# Patient Record
Sex: Male | Born: 1965
Health system: Southern US, Community
[De-identification: ages and names within clinical notes are randomized; demographics above are authoritative.]

## PROBLEM LIST (undated history)

## (undated) DIAGNOSIS — K429 Umbilical hernia without obstruction or gangrene: Secondary | ICD-10-CM

## (undated) DIAGNOSIS — K219 Gastro-esophageal reflux disease without esophagitis: Secondary | ICD-10-CM

## (undated) DIAGNOSIS — T8859XA Other complications of anesthesia, initial encounter: Secondary | ICD-10-CM

## (undated) DIAGNOSIS — K2270 Barrett's esophagus without dysplasia: Secondary | ICD-10-CM

## (undated) DIAGNOSIS — K6289 Other specified diseases of anus and rectum: Secondary | ICD-10-CM

## (undated) DIAGNOSIS — K5790 Diverticulosis of intestine, part unspecified, without perforation or abscess without bleeding: Secondary | ICD-10-CM

## (undated) DIAGNOSIS — IMO0001 Reserved for inherently not codable concepts without codable children: Secondary | ICD-10-CM

## (undated) DIAGNOSIS — K449 Diaphragmatic hernia without obstruction or gangrene: Secondary | ICD-10-CM

## (undated) DIAGNOSIS — Z531 Procedure and treatment not carried out because of patient's decision for reasons of belief and group pressure: Secondary | ICD-10-CM

## (undated) DIAGNOSIS — R0981 Nasal congestion: Secondary | ICD-10-CM

## (undated) DIAGNOSIS — T4145XA Adverse effect of unspecified anesthetic, initial encounter: Secondary | ICD-10-CM

## (undated) DIAGNOSIS — R51 Headache: Secondary | ICD-10-CM

## (undated) HISTORY — DX: Barrett's esophagus without dysplasia: K22.70

## (undated) HISTORY — DX: Diaphragmatic hernia without obstruction or gangrene: K44.9

## (undated) HISTORY — DX: Diverticulosis of intestine, part unspecified, without perforation or abscess without bleeding: K57.90

## (undated) HISTORY — PX: CARPAL TUNNEL RELEASE: SHX101

## (undated) HISTORY — DX: Gastro-esophageal reflux disease without esophagitis: K21.9

## (undated) HISTORY — PX: WRIST SURGERY: SHX841

## (undated) HISTORY — DX: Other specified diseases of anus and rectum: K62.89

## (undated) HISTORY — DX: Umbilical hernia without obstruction or gangrene: K42.9

## (undated) HISTORY — DX: Nasal congestion: R09.81

---

## 2007-08-19 HISTORY — PX: ORIF DISTAL RADIUS FRACTURE: SUR927

## 2008-03-04 ENCOUNTER — Emergency Department (HOSPITAL_COMMUNITY): Admission: EM | Admit: 2008-03-04 | Discharge: 2008-03-04 | Payer: Self-pay | Admitting: Emergency Medicine

## 2008-03-17 ENCOUNTER — Ambulatory Visit: Admission: RE | Admit: 2008-03-17 | Discharge: 2008-03-17 | Payer: Self-pay | Admitting: Neurosurgery

## 2008-03-21 LAB — HM COLONOSCOPY

## 2008-03-22 ENCOUNTER — Encounter: Admission: RE | Admit: 2008-03-22 | Discharge: 2008-03-22 | Payer: Self-pay | Admitting: Orthopedic Surgery

## 2010-01-31 DIAGNOSIS — K2270 Barrett's esophagus without dysplasia: Secondary | ICD-10-CM

## 2010-01-31 HISTORY — DX: Barrett's esophagus without dysplasia: K22.70

## 2010-04-01 ENCOUNTER — Encounter (INDEPENDENT_AMBULATORY_CARE_PROVIDER_SITE_OTHER): Payer: Self-pay | Admitting: Surgery

## 2010-04-01 ENCOUNTER — Inpatient Hospital Stay (HOSPITAL_COMMUNITY): Admission: RE | Admit: 2010-04-01 | Discharge: 2010-04-06 | Payer: Self-pay | Admitting: Surgery

## 2010-04-01 HISTORY — PX: OTHER SURGICAL HISTORY: SHX169

## 2010-11-01 LAB — CBC
HCT: 41.6 % (ref 39.0–52.0)
HCT: 44.1 % (ref 39.0–52.0)
Hemoglobin: 14.2 g/dL (ref 13.0–17.0)
Hemoglobin: 15.3 g/dL (ref 13.0–17.0)
MCH: 30.3 pg (ref 26.0–34.0)
MCHC: 34.1 g/dL (ref 30.0–36.0)
MCHC: 34.6 g/dL (ref 30.0–36.0)
MCV: 88.9 fL (ref 78.0–100.0)
Platelets: 248 10*3/uL (ref 150–400)
RBC: 4.68 MIL/uL (ref 4.22–5.81)
RDW: 12.8 % (ref 11.5–15.5)
WBC: 15.8 10*3/uL — ABNORMAL HIGH (ref 4.0–10.5)

## 2010-11-01 LAB — GLUCOSE, CAPILLARY
Glucose-Capillary: 107 mg/dL — ABNORMAL HIGH (ref 70–99)
Glucose-Capillary: 114 mg/dL — ABNORMAL HIGH (ref 70–99)
Glucose-Capillary: 119 mg/dL — ABNORMAL HIGH (ref 70–99)
Glucose-Capillary: 126 mg/dL — ABNORMAL HIGH (ref 70–99)
Glucose-Capillary: 134 mg/dL — ABNORMAL HIGH (ref 70–99)
Glucose-Capillary: 138 mg/dL — ABNORMAL HIGH (ref 70–99)
Glucose-Capillary: 143 mg/dL — ABNORMAL HIGH (ref 70–99)
Glucose-Capillary: 144 mg/dL — ABNORMAL HIGH (ref 70–99)
Glucose-Capillary: 146 mg/dL — ABNORMAL HIGH (ref 70–99)
Glucose-Capillary: 146 mg/dL — ABNORMAL HIGH (ref 70–99)
Glucose-Capillary: 148 mg/dL — ABNORMAL HIGH (ref 70–99)
Glucose-Capillary: 149 mg/dL — ABNORMAL HIGH (ref 70–99)
Glucose-Capillary: 153 mg/dL — ABNORMAL HIGH (ref 70–99)
Glucose-Capillary: 154 mg/dL — ABNORMAL HIGH (ref 70–99)
Glucose-Capillary: 156 mg/dL — ABNORMAL HIGH (ref 70–99)
Glucose-Capillary: 157 mg/dL — ABNORMAL HIGH (ref 70–99)
Glucose-Capillary: 157 mg/dL — ABNORMAL HIGH (ref 70–99)
Glucose-Capillary: 159 mg/dL — ABNORMAL HIGH (ref 70–99)
Glucose-Capillary: 168 mg/dL — ABNORMAL HIGH (ref 70–99)
Glucose-Capillary: 181 mg/dL — ABNORMAL HIGH (ref 70–99)
Glucose-Capillary: 189 mg/dL — ABNORMAL HIGH (ref 70–99)
Glucose-Capillary: 198 mg/dL — ABNORMAL HIGH (ref 70–99)
Glucose-Capillary: 219 mg/dL — ABNORMAL HIGH (ref 70–99)

## 2010-11-01 LAB — COMPREHENSIVE METABOLIC PANEL
ALT: 26 U/L (ref 0–53)
Alkaline Phosphatase: 49 U/L (ref 39–117)
CO2: 29 mEq/L (ref 19–32)
Calcium: 9.7 mg/dL (ref 8.4–10.5)
GFR calc non Af Amer: >60 mL/min (ref >60)
Glucose, Bld: 121 mg/dL — ABNORMAL HIGH (ref 70–99)
Sodium: 138 mEq/L (ref 135–145)

## 2010-11-01 LAB — URINE MICROSCOPIC-ADD ON

## 2010-11-01 LAB — DIFFERENTIAL
Basophils Absolute: 0 10*3/uL (ref 0.0–0.1)
Basophils Relative: 0 % (ref 0–1)
Eosinophils Absolute: 0.1 10*3/uL (ref 0.0–0.7)
Lymphs Abs: 1.4 10*3/uL (ref 0.7–4.0)
Neutrophils Relative %: 64 % (ref 43–77)

## 2010-11-01 LAB — URINALYSIS, ROUTINE W REFLEX MICROSCOPIC
Bilirubin Urine: NEGATIVE
Glucose, UA: NEGATIVE mg/dL
Ketones, ur: NEGATIVE mg/dL
pH: 5.5 (ref 5.0–8.0)

## 2010-11-01 LAB — PROTIME-INR
INR: 1.08 (ref 0.00–1.49)
Prothrombin Time: 14.2 seconds (ref 11.6–15.2)

## 2010-11-01 LAB — BASIC METABOLIC PANEL
BUN: 5 mg/dL — ABNORMAL LOW (ref 6–23)
CO2: 28 mEq/L (ref 19–32)
Calcium: 8.2 mg/dL — ABNORMAL LOW (ref 8.4–10.5)
Chloride: 101 mEq/L (ref 96–112)
Creatinine, Ser: 0.84 mg/dL (ref 0.4–1.5)
GFR calc Af Amer: >60 mL/min (ref >60)
GFR calc non Af Amer: >60 mL/min (ref >60)
Glucose, Bld: 189 mg/dL — ABNORMAL HIGH (ref 70–99)
Potassium: 4.3 mEq/L (ref 3.5–5.1)
Sodium: 135 mEq/L (ref 135–145)

## 2010-12-31 NOTE — Op Note (Signed)
NAMECALIL, AMOR                  ACCOUNT NO.:  [id number]   MEDICAL RECORD NO.:  [id number]          PATIENT TYPE:   LOCATION:                                 FACILITY:   PHYSICIAN:  Artist Pais. Mina Marble, M.D.   DATE OF BIRTH:   DATE OF PROCEDURE:  03/17/2008  DATE OF DISCHARGE:                               OPERATIVE REPORT   PREOPERATIVE DIAGNOSIS:  Displaced intra-articular fracture, distal  radius on left.   POSTOPERATIVE DIAGNOSIS:  Displaced intra-articular fracture, distal  radius on left.   PROCEDURE:  Open reduction and fixation of above.   SURGEON:  Artist Pais. Mina Marble, MD   ASSISTANT:  None.   ANESTHESIA:  General.   TOURNIQUET TIME:  51 minutes.   COMPLICATIONS:  None.   DRAINS:  None.   OPERATION REPORT:  The patient was taken to the operating suite.  After  induction of adequate general anesthesia, left upper extremity was  prepped and draped in sterile fashion.  An Esmarch was used to  exsanguinate the limb and tourniquet was inflated to 250 mmHg.  At this  point in time, a longitudinal incision was made over the palmar aspect  of the left hand and wrist over the flexor carpi radialis tendon.  Skin  was incised.  The sheath overlying the FCR was incised.  The FCR was  retracted to the midline, the radial artery at lateral side.  This  interval was developed down to the level of the pronator quadratus.  The  pronator quadratus was subperiosteally stripped off the distal radius,  thus exposing the fracture site.  The fracture was carefully reduced  with manual traction, flexion, and ulnar deviation.  Once this was done,  the DVR plate was passed into the lower aspect of the distal radius with  a slotted hole and under fluoroscopic guidance, it was deemed to be in  adequate position.  Once this was done, the remaining 3 cortical screws  were placed proximally followed by smooth pegs distally x7.  Intraoperative fluoroscopy then revealed near anatomic  reduction on both  the AP lateral and oblique view.  The wound was then thoroughly  irrigated.  The incision was closed with 0 Vicryl to approximate the  pronator quadratus and a 3-0 Prolene subcuticular stitch on the skin.  Steri-Strips, 4x4s, fluffs, and a volar splint was applied.  The patient  tolerated the procedure well and went to recovery room in stable  fashion.     Artist Pais Mina Marble, M.D.  Electronically Signed    MAW/MEDQ  D:  03/17/2008  T:  03/18/2008  Job:  [id number]

## 2010-12-31 NOTE — Consult Note (Signed)
NAMEAKASHDEEP, CHUBA                  ACCOUNT NO.:  [id number]   MEDICAL RECORD NO.:  [id number]          PATIENT TYPE:  EMS   LOCATION:  ED                           FACILITY:  [facility]   PHYSICIAN:  Artist Pais. Mina Marble, M.D.DATE OF BIRTH:  10/02/1965   DATE OF CONSULTATION:  03/04/2008  DATE OF DISCHARGE:  03/04/2008                                 CONSULTATION   REQUESTING PHYSICIAN:  Bethann Berkshire, MD   REASON FOR CONSULTATION:  This is a 45 year old male, who is right-hand  dominant, and [place]  He  presents today with a displaced fracture of distal radius nondominant  left side.  He is 32, has no known drug allergies, no current  medications, no recent hospitalizations, or surgery.   FAMILY MEDICAL HISTORY:  Noncontributory.   SOCIAL HISTORY:  Noncontributory.   PHYSICAL EXAMINATION:  This is 45 year old male, alert and oriented x3.  Examination of the left upper extremity reveals an obvious deformity of  the hand and wrist with what appears to be a dorsally angulated distal  radius fracture.  The skin is intact.  Median, radial, and ulnar nerve  function is intact.  There are no open wounds.  The rest of his right  and left upper extremity exams are all by comparison.   X-rays show a distal radius fracture with apex lower angulation 30  degrees displaced with minimal articular extension.  The patient was  given a 2% lidocaine hematoma block, was placed in fingertrap traction,  and closed reduction was performed.  He was placed in a well-padded  sugar tong splint.  Postreduction films show restoration of a near  anatomic alignment.   IMPRESSION:  We have a 45 year old male, status post closed reduction of  distal radius fracture nondominant left wrist.   The patient was given discharge instructions for splint cast care and  instructions on the signs and symptoms of compartment syndrome, Percocet  for pain, and to follow up in my  office on March 07, 2008.  It was  discussed in great detail with Mr. Earhart the fact that this may  redisplace since both cortices were involved and it may require plate  fixation, however, at this point in time he has a near anatomic  alignment and we should be able to treat this conservatively at least  initially.  We will see him back in my office again on March 07, 2008,  for repeat films and repeat evaluation.      Artist Pais Mina Marble, M.D.  Electronically Signed     MAW/MEDQ  D:  03/04/2008  T:  03/05/2008  Job:  [id number]

## 2011-02-20 ENCOUNTER — Telehealth: Payer: Self-pay | Admitting: Internal Medicine

## 2011-02-20 NOTE — Telephone Encounter (Signed)
Forwarded to Dr. Brodie for review. °

## 2011-03-13 ENCOUNTER — Encounter: Payer: Self-pay | Admitting: Internal Medicine

## 2011-05-07 ENCOUNTER — Encounter: Payer: Self-pay | Admitting: Internal Medicine

## 2011-05-07 ENCOUNTER — Ambulatory Visit (INDEPENDENT_AMBULATORY_CARE_PROVIDER_SITE_OTHER): Payer: Federal, State, Local not specified - PPO | Admitting: Internal Medicine

## 2011-05-07 VITALS — BP 136/74 | HR 80 | Ht 66.0 in | Wt 196.0 lb

## 2011-05-07 DIAGNOSIS — K2270 Barrett's esophagus without dysplasia: Secondary | ICD-10-CM

## 2011-05-07 DIAGNOSIS — K5732 Diverticulitis of large intestine without perforation or abscess without bleeding: Secondary | ICD-10-CM

## 2011-05-07 MED ORDER — OMEPRAZOLE 40 MG PO CPDR: 40.0000 mg | DELAYED_RELEASE_CAPSULE | Freq: Every day | ORAL | Status: DC

## 2011-05-07 NOTE — Patient Instructions (Signed)
We have sent the following medications to your pharmacy: Omeprazole 40 mg. Take 1 tablet by mouth once daily. You will be due for a recall endoscopy in 01/2012. We will send you a reminder in the mail when it gets closer to that time. CC: Dr Juluis Rainier

## 2011-05-07 NOTE — Progress Notes (Signed)
Nathan Park 02-Jan-1966 MRN [id number]   History of Present Illness:  This is a 45 year old white male with a new diagnosis of long segment Barrett's esophagus.  His last upper endoscopy in was June 2011 in [place]. Patient is switching to a physician closer to his home since he now lives in [place]. He denies any symptoms of reflux until he ran out of omeprazole 40 mg a day. He denies dysphagia. He sleeps with the head of the bed elevated and tries to eat at least 3 hours prior to going to bed. He has a sedentary job and denies lifting heavy objects. 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 He is status post sigmoid resection by Dr.Gerkin in August 2011. He is doing well from that standpoint. A colonoscopy prior to the colon resection did not show any polyps.   Past Medical History  Diagnosis Date  . Diverticulosis   . Umbilical hernia   . GERD (gastroesophageal reflux disease)   . Undiagnosed cardiac murmurs   . Nephrolithiasis   . Sleep apnea   . Barrett esophagus 01/31/10    at 32 cm  . Hiatal hernia   . Diabetes mellitus     diet control   . Hemorrhoids   . Rectal pain    Past Surgical History  Procedure Date  . Sigmoid colectomy 04/01/10  . Take down of colovesical fistula 04/01/10  . Takedown of enterovesical fistula 04/01/10  . Repair umblical hernia repair 04/01/10  . Orif distal radius fracture 2009  . Carpal tunnel release   . Mouth surgery     tooth extraction  . Wrist surgery     left    reports that he has never smoked. He has never used smokeless tobacco. He reports that he drinks alcohol. He reports that he does not use illicit drugs. family history includes Diabetes in his father.  There is no history of Colon cancer. Allergies  Allergen Reactions  . Oxycontin     Itching and nausea         Review of Systems:  The remainder of the 10  point ROS is negative except as outlined in H&P   Physical Exam: General appearance  Well  developed, in no distress. Eyes- non icteric. HEENT nontraumatic, normocephalic. Mouth no lesions, tongue papillated, no cheilosis. Neck supple without adenopathy, thyroid not enlarged, no carotid bruits, no JVD. Lungs Clear to auscultation bilaterally. Cor normal S1 normal S2, regular rhythm , no murmur,  quiet precordium. Abdomen minimal tenderness in left lower quadrant. No distention. Normal active bowel sounds. Rectal: Not done. Extremities no pedal edema. Skin no lesions. Neurological alert and oriented x 3. Psychological normal mood and affect.  Assessment and Plan:  Problem #1 Long segment Barrett's esophagus. Patient will be due for  recall upper endoscopy in June 2013. He is to continue omeprazole 40 mg a day which we will send to his mail order pharmacy. Samples of Nexium 40 mg a day have given to him to take over the next 2 weeks until his mail order arrives.  Problem #2 History of diverticulitis of the sigmoid resection for colovesical fistula. Patient is currently asymptomatic. He is to continue a high fiber diet. A recall colonoscopy will be due in10 years.   05/07/2011 Lina Sar

## 2011-05-16 LAB — CBC
HCT: 44.3
Hemoglobin: 15.1
MCHC: 34
MCV: 88.9
Platelets: 271
RBC: 4.99
RDW: 12.8
WBC: 12.3 — ABNORMAL HIGH

## 2011-05-22 ENCOUNTER — Ambulatory Visit (INDEPENDENT_AMBULATORY_CARE_PROVIDER_SITE_OTHER): Payer: Federal, State, Local not specified - PPO | Admitting: Internal Medicine

## 2011-05-22 ENCOUNTER — Other Ambulatory Visit (INDEPENDENT_AMBULATORY_CARE_PROVIDER_SITE_OTHER): Payer: Federal, State, Local not specified - PPO

## 2011-05-22 VITALS — BP 116/70 | HR 70 | Temp 98.3°F | Resp 16 | Wt 195.0 lb

## 2011-05-22 DIAGNOSIS — K648 Other hemorrhoids: Secondary | ICD-10-CM

## 2011-05-22 DIAGNOSIS — IMO0001 Reserved for inherently not codable concepts without codable children: Secondary | ICD-10-CM

## 2011-05-22 DIAGNOSIS — Z23 Encounter for immunization: Secondary | ICD-10-CM

## 2011-05-22 DIAGNOSIS — E118 Type 2 diabetes mellitus with unspecified complications: Secondary | ICD-10-CM | POA: Insufficient documentation

## 2011-05-22 LAB — MICROALBUMIN / CREATININE URINE RATIO: Microalb, Ur: 1.1 mg/dL (ref 0.0–1.9)

## 2011-05-22 LAB — COMPREHENSIVE METABOLIC PANEL
ALT: 33 U/L (ref 0–53)
Alkaline Phosphatase: 49 U/L (ref 39–117)
Creatinine, Ser: 1 mg/dL (ref 0.4–1.5)
Glucose, Bld: 129 mg/dL — ABNORMAL HIGH (ref 70–99)
Sodium: 140 mEq/L (ref 135–145)
Total Bilirubin: 0.5 mg/dL (ref 0.3–1.2)
Total Protein: 7.7 g/dL (ref 6.0–8.3)

## 2011-05-22 LAB — URINALYSIS, ROUTINE W REFLEX MICROSCOPIC
Ketones, ur: NEGATIVE
Leukocytes, UA: NEGATIVE
Specific Gravity, Urine: 1.025 (ref 1.000–1.030)
Urobilinogen, UA: 0.2 (ref 0.0–1.0)
pH: 6 (ref 5.0–8.0)

## 2011-05-22 LAB — CBC WITH DIFFERENTIAL/PLATELET
Basophils Relative: 0.5 % (ref 0.0–3.0)
Eosinophils Relative: 4.1 % (ref 0.0–5.0)
HCT: 46.2 % (ref 39.0–52.0)
Hemoglobin: 15.7 g/dL (ref 13.0–17.0)
Lymphs Abs: 2.1 10*3/uL (ref 0.7–4.0)
Monocytes Relative: 8.1 % (ref 3.0–12.0)
Neutro Abs: 4.7 10*3/uL (ref 1.4–7.7)
RBC: 5.25 Mil/uL (ref 4.22–5.81)
WBC: 7.7 10*3/uL (ref 4.5–10.5)

## 2011-05-22 LAB — TSH: TSH: 0.75 u[IU]/mL (ref 0.35–5.50)

## 2011-05-22 LAB — LIPID PANEL: Total CHOL/HDL Ratio: 4

## 2011-05-22 MED ORDER — SAXAGLIPTIN HCL 5 MG PO TABS: 5.0000 mg | ORAL_TABLET | Freq: Every day | ORAL | Status: DC

## 2011-05-22 MED ORDER — HYDROCORTISONE ACE-PRAMOXINE 1-1 % RE FOAM: 1.0000 | Freq: Two times a day (BID) | RECTAL | Status: AC

## 2011-05-22 NOTE — Progress Notes (Signed)
Subjective:    Patient ID: Nathan Park, male    DOB: [birth year], 45 y.o.   MRN: [id number]  Diabetes He presents for his follow-up diabetic visit. He has type 2 diabetes mellitus. The initial diagnosis of diabetes was made 2 years (2 years) ago. His disease course has been fluctuating. There are no hypoglycemic associated symptoms. Pertinent negatives for hypoglycemia include no pallor. Pertinent negatives for diabetes include no blurred vision, no chest pain, no fatigue, no foot paresthesias, no foot ulcerations, no polydipsia, no polyphagia, no polyuria, no visual change, no weakness and no weight loss. There are no hypoglycemic complications. Symptoms are stable. There are no diabetic complications. Current diabetic treatment includes diet. He is compliant with treatment most of the time. His weight is increasing steadily. He is following a generally healthy diet. Meal planning includes avoidance of concentrated sweets. He has not had a previous visit with a dietician. He participates in exercise intermittently. There is no change in his home blood glucose trend. His breakfast blood glucose range is generally 110-130 mg/dl. His lunch blood glucose range is generally 130-140 mg/dl. His dinner blood glucose range is generally 140-180 mg/dl. His highest blood glucose is >200 mg/dl. His overall blood glucose range is 130-140 mg/dl. An ACE inhibitor/angiotensin II receptor blocker is not being taken. He does not see a podiatrist.Eye exam is not current.      Review of Systems  Constitutional: Negative.  Negative for weight loss and fatigue.  HENT: Negative.   Eyes: Negative.  Negative for blurred vision.  Respiratory: Negative for apnea, cough, choking, chest tightness, shortness of breath, wheezing and stridor.   Cardiovascular: Negative for chest pain, palpitations and leg swelling.  Gastrointestinal: Positive for constipation and rectal pain (and itching). Negative for nausea, vomiting, abdominal  pain, diarrhea, blood in stool, abdominal distention and anal bleeding.  Genitourinary: Negative.  Negative for polyuria.  Musculoskeletal: Negative for myalgias, back pain, joint swelling, arthralgias and gait problem.  Skin: Negative for color change, pallor, rash and wound.  Neurological: Negative.  Negative for weakness.  Hematological: Negative for polydipsia, polyphagia and adenopathy. Does not bruise/bleed easily.  Psychiatric/Behavioral: Negative.        Objective:   Physical Exam  Vitals reviewed. Constitutional: He is oriented to person, place, and time. He appears well-developed. No distress.  HENT:  Head: Normocephalic and atraumatic.  Mouth/Throat: Oropharynx is clear and moist. No oropharyngeal exudate.  Eyes: Conjunctivae and EOM are normal. Right eye exhibits no discharge. Left eye exhibits no discharge. No scleral icterus.  Neck: Normal range of motion. Neck supple. No JVD present. No tracheal deviation present. No thyromegaly present.  Cardiovascular: Normal rate, regular rhythm, normal heart sounds and intact distal pulses.  Exam reveals no gallop and no friction rub.   No murmur heard. Pulmonary/Chest: Effort normal and breath sounds normal. No stridor. No respiratory distress. He has no wheezes. He has no rales. He exhibits no tenderness.  Abdominal: Soft. Bowel sounds are normal. He exhibits no distension and no mass. There is no tenderness. There is no rebound and no guarding. Hernia confirmed negative in the right inguinal area and confirmed negative in the left inguinal area.  Genitourinary: Prostate normal, testes normal and penis normal. Rectal exam shows external hemorrhoid and internal hemorrhoid. Rectal exam shows no fissure, no mass, no tenderness and anal tone normal. Guaiac negative stool. Prostate is not enlarged and not tender. Right testis shows no mass, no swelling and no tenderness. Right testis is descended. Left testis shows  no mass, no swelling and no  tenderness. Left testis is descended. Circumcised. No penile tenderness. No discharge found.  Musculoskeletal: Normal range of motion. He exhibits no edema and no tenderness.  Lymphadenopathy:    He has no cervical adenopathy.       Right: No inguinal adenopathy present.       Left: No inguinal adenopathy present.  Neurological: He is alert and oriented to person, place, and time. He has normal reflexes. He displays normal reflexes. No cranial nerve deficit. He exhibits normal muscle tone. Coordination normal.  Skin: Skin is warm and dry. No rash noted. He is not diaphoretic. No erythema. No pallor.  Psychiatric: He has a normal mood and affect. His behavior is normal. Judgment and thought content normal.      Lab Results  Component Value Date   WBC 15.8* 04/02/2010   HGB 14.2 04/02/2010   HCT 41.6 04/02/2010   PLT 248 04/02/2010   GLUCOSE 189* 04/02/2010   ALT 26 03/25/2010   AST 22 03/25/2010   NA 135 04/02/2010   K 4.3 04/02/2010   CL 101 04/02/2010   CREATININE 0.84 04/02/2010   BUN 5* 04/02/2010   CO2 28 04/02/2010   INR 1.08 03/25/2010      Assessment & Plan:

## 2011-05-22 NOTE — Patient Instructions (Signed)
Diabetes, Type 2 Diabetes is a lasting (chronic) disease. In type 2 diabetes, the pancreas does not make enough insulin (a hormone), and the body does not respond normally to the insulin that is made. This type of diabetes was also previously called adult onset diabetes. About 90% of all those who have diabetes have type 2. It usually occurs after the age of 70 but can occur at any age. CAUSES Unlike type 1 diabetes, which happens because insulin is no longer being made, type 2 diabetes happens because the body is making less insulin and has trouble using the insulin properly. SYMPTOMS  Drinking more than usual.   Urinating more than usual.   Blurred vision.   Dry, itchy skin.   Frequent infection like yeast infections in women.   More tired than usual (fatigue).  TREATMENT  Healthy eating.   Exercise.   Medication, if needed.   Monitoring blood glucose (sugar).   Seeing your caregiver regularly.  HOME CARE INSTRUCTIONS  Check your blood glucose (sugar) at least once daily. More frequent monitoring may be necessary, depending on your medications and on how well your diabetes is controlled. Your caregiver will advise you.   Take your medicine as directed by your caregiver.   Do not smoke.   Make wise food choices. Ask your caregiver for information. Weight loss can improve your diabetes.   Learn about low blood glucose (hypoglycemia) and how to treat it.   Get your eyes checked regularly.   Have a yearly physical exam. Have your blood pressure checked. Get your blood and urine tested.   Wear a pendant or bracelet saying that you have diabetes.   Check your feet every night for sores. Let your caregiver know if you have sores that are not healing.  SEEK MEDICAL CARE IF:  You are having problems keeping your blood glucose at target range.   You feel you might be having problems with your medicines.   You have symptoms of an illness that is not improving after 24  hours.   You have a sore or wound that is not healing.   You notice a change in vision or a new problem with your vision.  You develop a fever of more than 100.5Hemorrhoids Hemorrhoids are dilated (enlarged) veins around the rectum. Sometimes clots will form in the veins. This makes them swollen and painful. These are called thrombosed hemorrhoids. Causes of hemorrhoids include: Pregnancy: this increases the pressure in the hemorrhoidal veins.  Constipation.  Straining to have a bowel movement.  HOME CARE INSTRUCTIONS Eat a well balanced diet and drink 6 to 8 glasses of water every day to avoid constipation. You may also use a bulk laxative.  Avoid straining to have bowel movements.  Keep anal area dry and clean.  Only take over-the-counter or prescription medicines for pain, discomfort, or fever as directed by your caregiver.  If thrombosed: Take hot sitz baths for 20 to 30 minutes, 3 to 4 times per day.  If the hemorrhoids are very tender and swollen, place ice packs on area as tolerated. Using ice packs between sitz baths may be helpful. Fill a plastic bag with ice and use a towel between the bag of ice and your skin.  Special creams and suppositories (Anusol, Nupercainal, Wyanoids) may be used or applied as directed.  Do not use a donut shaped pillow or sit on the toilet for long periods. This increases blood pooling and pain.  Move your bowels when your body has  the urge; this will require less straining and will decrease pain and pressure.  Only take over-the-counter or prescription medicines for pain, discomfort, or fever as directed by your caregiver.  SEEK MEDICAL CARE IF: You have increasing pain and swelling that is not controlled with your prescription.  You have uncontrolled bleeding.  You have an inability or difficulty having a bowel movement.  You have pain or inflammation outside the area of the hemorrhoids.  You have chills and/or an oral temperature above 100.5 that  lasts for 2 days or longer, or as your caregiver suggests.  MAKE SURE YOU:  Understand these instructions.  Will watch your condition.  Will get help right away if you are not doing well or get worse.  Document Released: 08/01/2000 Document Re-Released: 07/17/2008  [facility] Patient Information 2011 [place], [place]..  Document Released: 08/04/2005 Document Re-Released: 08/26/2009 [facility] Patient Information 2011 [place], [place].

## 2011-05-23 ENCOUNTER — Encounter: Payer: Self-pay | Admitting: Internal Medicine

## 2011-05-23 LAB — HEMOGLOBIN A1C: Hgb A1c MFr Bld: 7 % — ABNORMAL HIGH (ref 4.6–6.5)

## 2011-05-23 NOTE — Assessment & Plan Note (Signed)
I will check his A1C and start him on onglyza, also will check his FLP and will monitor his renal function and look for albuminuria

## 2011-05-23 NOTE — Assessment & Plan Note (Signed)
Will proctofoam cream

## 2011-05-27 ENCOUNTER — Encounter: Payer: Self-pay | Admitting: Internal Medicine

## 2011-06-09 LAB — HM DIABETES EYE EXAM: HM Diabetic Eye Exam: NORMAL

## 2011-06-10 ENCOUNTER — Encounter: Payer: Self-pay | Admitting: Internal Medicine

## 2011-10-16 ENCOUNTER — Encounter: Payer: Self-pay | Admitting: Internal Medicine

## 2011-10-16 ENCOUNTER — Other Ambulatory Visit (INDEPENDENT_AMBULATORY_CARE_PROVIDER_SITE_OTHER): Payer: Federal, State, Local not specified - PPO

## 2011-10-16 ENCOUNTER — Ambulatory Visit (INDEPENDENT_AMBULATORY_CARE_PROVIDER_SITE_OTHER): Payer: Federal, State, Local not specified - PPO | Admitting: Internal Medicine

## 2011-10-16 DIAGNOSIS — E781 Pure hyperglyceridemia: Secondary | ICD-10-CM

## 2011-10-16 DIAGNOSIS — J309 Allergic rhinitis, unspecified: Secondary | ICD-10-CM | POA: Insufficient documentation

## 2011-10-16 DIAGNOSIS — IMO0001 Reserved for inherently not codable concepts without codable children: Secondary | ICD-10-CM

## 2011-10-16 DIAGNOSIS — K648 Other hemorrhoids: Secondary | ICD-10-CM

## 2011-10-16 DIAGNOSIS — E785 Hyperlipidemia, unspecified: Secondary | ICD-10-CM | POA: Insufficient documentation

## 2011-10-16 LAB — COMPREHENSIVE METABOLIC PANEL
Alkaline Phosphatase: 38 U/L — ABNORMAL LOW (ref 39–117)
BUN: 20 mg/dL (ref 6–23)
Glucose, Bld: 102 mg/dL — ABNORMAL HIGH (ref 70–99)
Sodium: 138 mEq/L (ref 135–145)
Total Bilirubin: 0.3 mg/dL (ref 0.3–1.2)

## 2011-10-16 LAB — LIPID PANEL
Cholesterol: 124 mg/dL (ref 0–200)
HDL: 37 mg/dL — ABNORMAL LOW (ref >39.00)
VLDL: 15 mg/dL (ref 0.0–40.0)

## 2011-10-16 LAB — HEMOGLOBIN A1C: Hgb A1c MFr Bld: 6.2 % (ref 4.6–6.5)

## 2011-10-16 MED ORDER — AZELASTINE-FLUTICASONE 137-50 MCG/ACT NA SUSP: 1.0000 | Freq: Two times a day (BID) | NASAL | Status: DC

## 2011-10-16 NOTE — Assessment & Plan Note (Signed)
Start dymista 

## 2011-10-16 NOTE — Assessment & Plan Note (Signed)
FLP today 

## 2011-10-16 NOTE — Assessment & Plan Note (Signed)
I will check his a1c today and will monitor his renal function 

## 2011-10-16 NOTE — Progress Notes (Signed)
Subjective:    Patient ID: Nathan Park, male    DOB: [birth year], 46 y.o.   MRN: [id number]  Diabetes He presents for his follow-up diabetic visit. He has type 2 diabetes mellitus. His disease course has been stable. There are no hypoglycemic associated symptoms. Pertinent negatives for hypoglycemia include no dizziness, headaches, pallor, seizures, speech difficulty or tremors. Pertinent negatives for diabetes include no blurred vision, no chest pain, no fatigue, no foot paresthesias, no foot ulcerations, no polydipsia, no polyphagia, no polyuria, no visual change, no weakness and no weight loss. There are no hypoglycemic complications. Symptoms are stable. There are no diabetic complications. Current diabetic treatment includes oral agent (monotherapy). He is compliant with treatment all of the time. His weight is stable. He is following a generally healthy diet. Meal planning includes avoidance of concentrated sweets. He has not had a previous visit with a dietician. He participates in exercise every other day. There is no change in his home blood glucose trend. His breakfast blood glucose range is generally 90-110 mg/dl. His lunch blood glucose range is generally 110-130 mg/dl. His dinner blood glucose range is generally 110-130 mg/dl. His highest blood glucose is 110-130 mg/dl. His overall blood glucose range is 110-130 mg/dl. An ACE inhibitor/angiotensin II receptor blocker is not being taken. He does not see a podiatrist.Eye exam is current.      Review of Systems  Constitutional: Negative for fever, chills, weight loss, diaphoresis, activity change, appetite change, fatigue and unexpected weight change.  HENT: Positive for congestion, rhinorrhea, sneezing and postnasal drip. Negative for hearing loss, ear pain, nosebleeds, sore throat, facial swelling, drooling, mouth sores, trouble swallowing, neck pain, neck stiffness, dental problem, voice change, sinus pressure, tinnitus and ear discharge.     Eyes: Negative.  Negative for blurred vision.  Respiratory: Negative for apnea, cough, choking, chest tightness, shortness of breath, wheezing and stridor.   Cardiovascular: Negative for chest pain, palpitations and leg swelling.  Gastrointestinal: Positive for rectal pain (and itching). Negative for nausea, vomiting, abdominal pain, diarrhea, constipation, blood in stool, abdominal distention and anal bleeding.  Genitourinary: Negative.  Negative for polyuria.  Musculoskeletal: Negative for myalgias, back pain, joint swelling, arthralgias and gait problem.  Skin: Negative for color change, pallor, rash and wound.  Neurological: Negative for dizziness, tremors, seizures, syncope, facial asymmetry, speech difficulty, weakness, light-headedness, numbness and headaches.  Hematological: Negative for polydipsia, polyphagia and adenopathy. Does not bruise/bleed easily.  Psychiatric/Behavioral: Negative.        Objective:   Physical Exam  Vitals reviewed. Constitutional: He is oriented to person, place, and time. He appears well-developed and well-nourished. No distress.  HENT:  Head: Normocephalic and atraumatic.  Nose: Mucosal edema and rhinorrhea present. No nose lacerations, sinus tenderness, nasal deformity, septal deviation or nasal septal hematoma. No epistaxis.  No foreign bodies. Right sinus exhibits no maxillary sinus tenderness and no frontal sinus tenderness. Left sinus exhibits no maxillary sinus tenderness and no frontal sinus tenderness.  Mouth/Throat: Oropharynx is clear and moist. No oropharyngeal exudate.  Eyes: Conjunctivae are normal. Right eye exhibits no discharge. Left eye exhibits no discharge. No scleral icterus.  Neck: Normal range of motion. Neck supple. No JVD present. No tracheal deviation present. No thyromegaly present.  Cardiovascular: Normal rate, regular rhythm, normal heart sounds and intact distal pulses.  Exam reveals no gallop and no friction rub.   No murmur  heard. Pulmonary/Chest: Effort normal and breath sounds normal. No stridor. No respiratory distress. He has no wheezes. He has no  rales. He exhibits no tenderness.  Abdominal: Soft. Bowel sounds are normal. He exhibits no distension and no mass. There is no tenderness. There is no rebound and no guarding.  Genitourinary: Rectal exam shows internal hemorrhoid. Rectal exam shows no external hemorrhoid, no fissure, no mass, no tenderness and anal tone normal. Guaiac negative stool.  Musculoskeletal: Normal range of motion. He exhibits no edema and no tenderness.  Lymphadenopathy:    He has no cervical adenopathy.  Neurological: He is oriented to person, place, and time.  Skin: Skin is warm and dry. No rash noted. He is not diaphoretic. No erythema. No pallor.  Psychiatric: He has a normal mood and affect. His behavior is normal. Judgment and thought content normal.      Lab Results  Component Value Date   WBC 7.7 05/22/2011   HGB 15.7 05/22/2011   HCT 46.2 05/22/2011   PLT 214.0 05/22/2011   GLUCOSE 102* 10/16/2011   CHOL 124 10/16/2011   TRIG 75.0 10/16/2011   HDL 37.00* 10/16/2011   LDLCALC 72 10/16/2011   ALT 28 10/16/2011   AST 20 10/16/2011   NA 138 10/16/2011   K 4.0 10/16/2011   CL 101 10/16/2011   CREATININE 1.0 10/16/2011   BUN 20 10/16/2011   CO2 29 10/16/2011   TSH 0.75 05/22/2011   INR 1.08 03/25/2010   HGBA1C 6.2 10/16/2011   MICROALBUR 1.1 05/22/2011      Assessment & Plan:

## 2011-10-16 NOTE — Patient Instructions (Signed)
Hemorrhoids Hemorrhoids are enlarged (dilated) veins around the rectum. There are 2 types of hemorrhoids, and the type of hemorrhoid is determined by its location. Internal hemorrhoids occur in the veins just inside the rectum.They are usually not painful, but they may bleed.However, they may poke through to the outside and become irritated and painful. External hemorrhoids involve the veins outside the anus and can be felt as a painful swelling or hard lump near the anus.They are often itchy and may crack and bleed. Sometimes clots will form in the veins. This makes them swollen and painful. These are called thrombosed hemorrhoids. CAUSES Causes of hemorrhoids include:  Pregnancy. This increases the pressure in the hemorrhoidal veins.   Constipation.   Straining to have a bowel movement.   Obesity.   Heavy lifting or other activity that caused you to strain.  TREATMENT Most of the time hemorrhoids improve in 1 to 2 weeks. However, if symptoms do not seem to be getting better or if you have a lot of rectal bleeding, your caregiver may perform a procedure to help make the hemorrhoids get smaller or remove them completely.Possible treatments include:  Rubber band ligation. A rubber band is placed at the base of the hemorrhoid to cut off the circulation.   Sclerotherapy. A chemical is injected to shrink the hemorrhoid.   Infrared light therapy. Tools are used to burn the hemorrhoid.   Hemorrhoidectomy. This is surgical removal of the hemorrhoid.  HOME CARE INSTRUCTIONS   Increase fiber in your diet. Ask your caregiver about using fiber supplements.   Drink enough water and fluids to keep your urine clear or pale yellow.   Exercise regularly.   Go to the bathroom when you have the urge to have a bowel movement. Do not wait.   Avoid straining to have bowel movements.   Keep the anal area dry and clean.   Only take over-the-counter or prescription medicines for pain, discomfort,  or fever as directed by your caregiver.  If your hemorrhoids are thrombosed:  Take warm sitz baths for 20 to 30 minutes, 3 to 4 times per day.   If the hemorrhoids are very tender and swollen, place ice packs on the area as tolerated. Using ice packs between sitz baths may be helpful. Fill a plastic bag with ice. Place a towel between the bag of ice and your skin.   Medicated creams and suppositories may be used or applied as directed.   Do not use a donut-shaped pillow or sit on the toilet for long periods. This increases blood pooling and pain.  SEEK MEDICAL CARE IF:   You have increasing pain and swelling that is not controlled with your medicine.   You have uncontrolled bleeding.   You have difficulty or you are unable to have a bowel movement.   You have pain or inflammation outside the area of the hemorrhoids.   You have chills or an oral temperature above 102 F (38.9 C).  MAKE SURE YOU:   Understand these instructions.   Will watch your condition.   Will get help right away if you are not doing well or get worse.  Document Released: 08/01/2000 Document Revised: 04/16/2011 Document Reviewed: 12/07/2007 [facility] Patient Information 2012 [place], [place].Diabetes, Type 2 Diabetes is a long-lasting (chronic) disease. In type 2 diabetes, the pancreas does not make enough insulin (a hormone), and the body does not respond normally to the insulin that is made. This type of diabetes was also previously called adult-onset diabetes. It  usually occurs after the age of 77, but it can occur at any age.  CAUSES  Type 2 diabetes happens because the pancreasis not making enough insulin or your body has trouble using the insulin that your pancreas does make properly. SYMPTOMS   Drinking more than usual.   Urinating more than usual.   Blurred vision.   Dry, itchy skin.   Frequent infections.   Feeling more tired than usual (fatigue).  DIAGNOSIS The diagnosis of type 2 diabetes  is usually made by one of the following tests:  Fasting blood glucose test. You will not eat for at least 8 hours and then take a blood test.   Random blood glucose test. Your blood glucose (sugar) is checked at any time of the day regardless of when you ate.   Oral glucose tolerance test (OGTT). Your blood glucose is measured after you have not eaten (fasted) and then after you drink a glucose containing beverage.  TREATMENT   Healthy eating.   Exercise.   Medicine, if needed.   Monitoring blood glucose.   Seeing your caregiver regularly.  HOME CARE INSTRUCTIONS   Check your blood glucose at least once a day. More frequent monitoring may be necessary, depending on your medicines and on how well your diabetes is controlled. Your caregiver will advise you.   Take your medicine as directed by your caregiver.   Do not smoke.   Make wise food choices. Ask your caregiver for information. Weight loss can improve your diabetes.   Learn about low blood glucose (hypoglycemia) and how to treat it.   Get your eyes checked regularly.   Have a yearly physical exam. Have your blood pressure checked and your blood and urine tested.   Wear a pendant or bracelet saying that you have diabetes.   Check your feet every night for cuts, sores, blisters, and redness. Let your caregiver know if you have any problems.  SEEK MEDICAL CARE IF:   You have problems keeping your blood glucose in target range.   You have problems with your medicines.   You have symptoms of an illness that do not improve after 24 hours.   You have a sore or wound that is not healing.   You notice a change in vision or a new problem with your vision.   You have a fever.  MAKE SURE YOU:  Understand these instructions.   Will watch your condition.   Will get help right away if you are not doing well or get worse.  Document Released: 08/04/2005 Document Revised: 04/17/2011 Document Reviewed: 01/20/2011 [facility]  Patient Information 2012 [place], [place].Allergic Rhinitis Allergic rhinitis is when the mucous membranes in the nose respond to allergens. Allergens are particles in the air that cause your body to have an allergic reaction. This causes you to release allergic antibodies. Through a chain of events, these eventually cause you to release histamine into the blood stream (hence the use of antihistamines). Although meant to be protective to the body, it is this release that causes your discomfort, such as frequent sneezing, congestion and an itchy runny nose.  CAUSES  The pollen allergens may come from grasses, trees, and weeds. This is seasonal allergic rhinitis, or "hay fever." Other allergens cause year-round allergic rhinitis (perennial allergic rhinitis) such as house dust mite allergen, pet dander and mold spores.  SYMPTOMS   Nasal stuffiness (congestion).   Runny, itchy nose with sneezing and tearing of the eyes.   There is often an  itching of the mouth, eyes and ears.  It cannot be cured, but it can be controlled with medications. DIAGNOSIS  If you are unable to determine the offending allergen, skin or blood testing may find it. TREATMENT   Avoid the allergen.   Medications and allergy shots (immunotherapy) can help.   Hay fever may often be treated with antihistamines in pill or nasal spray forms. Antihistamines block the effects of histamine. There are over-the-counter medicines that may help with nasal congestion and swelling around the eyes. Check with your caregiver before taking or giving this medicine.  If the treatment above does not work, there are many new medications your caregiver can prescribe. Stronger medications may be used if initial measures are ineffective. Desensitizing injections can be used if medications and avoidance fails. Desensitization is when a patient is given ongoing shots until the body becomes less sensitive to the allergen. Make sure you follow up with your  caregiver if problems continue. SEEK MEDICAL CARE IF:   You develop fever (more than 100.5 F (38.1 C).   You develop a cough that does not stop easily (persistent).   You have shortness of breath.   You start wheezing.   Symptoms interfere with normal daily activities.  Document Released: 04/29/2001 Document Revised: 04/16/2011 Document Reviewed: 11/08/2008 [facility] Patient Information 2012 [place], [place].

## 2011-10-16 NOTE — Assessment & Plan Note (Signed)
He used proctofoam and did not get much relief so I have referred him to GI to see if needs a procedure done on the hemorrhoids

## 2011-10-17 ENCOUNTER — Other Ambulatory Visit: Payer: Self-pay

## 2011-10-17 DIAGNOSIS — IMO0001 Reserved for inherently not codable concepts without codable children: Secondary | ICD-10-CM

## 2011-10-17 MED ORDER — SAXAGLIPTIN HCL 5 MG PO TABS: 5.0000 mg | ORAL_TABLET | Freq: Every day | ORAL | Status: DC

## 2011-10-28 ENCOUNTER — Telehealth: Payer: Self-pay

## 2011-10-28 DIAGNOSIS — J309 Allergic rhinitis, unspecified: Secondary | ICD-10-CM

## 2011-10-28 DIAGNOSIS — IMO0001 Reserved for inherently not codable concepts without codable children: Secondary | ICD-10-CM

## 2011-10-28 MED ORDER — FLUTICASONE PROPIONATE 50 MCG/ACT NA SUSP: 2.0000 | Freq: Every day | NASAL | Status: DC

## 2011-10-28 MED ORDER — AZELASTINE HCL 0.1 % NA SOLN: 1.0000 | Freq: Two times a day (BID) | NASAL | Status: DC

## 2011-10-28 MED ORDER — LINAGLIPTIN 5 MG PO TABS: 5.0000 mg | ORAL_TABLET | Freq: Every day | ORAL | Status: DC

## 2011-10-28 NOTE — Telephone Encounter (Signed)
Patient called [facility] stating that rx for onglyza is too expensive and would like something different. Thanks

## 2011-10-28 NOTE — Telephone Encounter (Signed)
Also dymista is too expensive so pt request something different. Insurance may cover flonase and astelin seperate

## 2011-10-28 NOTE — Telephone Encounter (Signed)
Changes made

## 2011-12-03 ENCOUNTER — Encounter: Payer: Self-pay | Admitting: Physician Assistant

## 2011-12-03 ENCOUNTER — Ambulatory Visit (INDEPENDENT_AMBULATORY_CARE_PROVIDER_SITE_OTHER): Payer: Federal, State, Local not specified - PPO | Admitting: Physician Assistant

## 2011-12-03 ENCOUNTER — Telehealth: Payer: Self-pay | Admitting: Internal Medicine

## 2011-12-03 VITALS — BP 108/76 | HR 80 | Ht 66.0 in | Wt 193.0 lb

## 2011-12-03 DIAGNOSIS — K5790 Diverticulosis of intestine, part unspecified, without perforation or abscess without bleeding: Secondary | ICD-10-CM

## 2011-12-03 DIAGNOSIS — K5730 Diverticulosis of large intestine without perforation or abscess without bleeding: Secondary | ICD-10-CM

## 2011-12-03 DIAGNOSIS — K2270 Barrett's esophagus without dysplasia: Secondary | ICD-10-CM | POA: Insufficient documentation

## 2011-12-03 DIAGNOSIS — K644 Residual hemorrhoidal skin tags: Secondary | ICD-10-CM

## 2011-12-03 DIAGNOSIS — K648 Other hemorrhoids: Secondary | ICD-10-CM

## 2011-12-03 MED ORDER — PRAMOXINE-HC 1-2.5 % EX CREA: TOPICAL_CREAM | CUTANEOUS | Status: DC

## 2011-12-03 NOTE — Patient Instructions (Signed)
We sent a prescription for rectal cream to [facility]. For the hemorrhoids. You can also get Balmex cream which is over the counter to use as well for the perianal skin area irritation. We scheduled the Endoscopy with Dr Juanda Chance for 01-23-2012. Directions provided.

## 2011-12-03 NOTE — Progress Notes (Signed)
Subjective:    Patient ID: Nathan Park, male    DOB: 02/27/1966, 46 y.o.   MRN: [id number]  HPI Nathan Park is a 46 year old white male known to Dr. Lina Sar. He has history of complicated diverticulitis and actually went underwent a sigmoid resection for diverticulitis and a colovesicular fistula in August of 2011. This was done by Dr. Gerrit Friends. Prior to that he had had colonoscopy done by Dr. Conley Rolls  in [place] in June of 2011 and was found to have moderate sigmoid diverticulosis and mild right-sided diverticulosis there was no fistulous opening identified at the time of that procedure. He did not have any polyps. He had small internal hemorrhoids. He also underwent upper endoscopy at that same time had a 2 cm hiatal hernia and what was felt to be a long segment of Barrett's with mild esophagitis.  He states that he has done fairly well since his surgery and has not had any recurrent diverticulitis. At this point he has been bothered by intermittent hemorrhoidal symptoms over the past 6 months. He says he spoke to his primary care doctor and was given an over-the-counter cream which he did not find for particularly helpful. At this time he has been having problems over the past couple of weeks with increased external hemorrhoidal itching especially after bowel movements. He says occasionally with a lot of itching he may see a small amount of bright red blood but is not noticing blood with his bowel movements. He says his stools have been a bit firmer but he has not been constipated nor has he been straining. He has not has no complaints of abdominal pain.  Apparently last evening he had urgency for bowel movement after eating dinner and then a bout of diarrhea which was  followed by abdominal cramping and then had prolapse of internal hemorrhoid. He sayshe used a hemorrhoid cream and apparently during the night of the hemorrhoid reduced itself. He says it  was not particularly painful, just scary.Marland Kitchen  He comes in  today for further advice regarding management of his ongoing rectal itching and hemorrhoids.    Review of Systems  Constitutional: Negative.   HENT: Negative.   Eyes: Negative.   Respiratory: Negative.   Cardiovascular: Negative.   Genitourinary: Negative.   Musculoskeletal: Negative.   Neurological: Negative.   Hematological: Negative.   Psychiatric/Behavioral: Negative.    Allergies  Allergen Reactions  . Oxycontin     Itching and nausea    Outpatient Prescriptions Prior to Visit  Medication Sig Dispense Refill  . azelastine (ASTELIN) 137 MCG/SPRAY nasal spray Place 1 spray into the nose 2 (two) times daily. Use in each nostril as directed  30 mL  12  . fluticasone (FLONASE) 50 MCG/ACT nasal spray Place 2 sprays into the nose daily.  16 g  11  . omeprazole (PRILOSEC) 40 MG capsule Take 1 capsule (40 mg total) by mouth daily.  90 capsule  2  . linagliptin (TRADJENTA) 5 MG TABS tablet Take 1 tablet (5 mg total) by mouth daily.  30 tablet  11   Patient Active Problem List  Diagnoses  . Type II or unspecified type diabetes mellitus without mention of complication, uncontrolled  . Hemorrhoids, internal  . Allergic rhinitis, cause unspecified  . Pure hyperglyceridemia  . Diverticulosis  . Barrett's esophagus       Objective:   Physical Exam a well-developed white male in no acute distress, pleasant blood pressure 108/76 pulse 80 height 5 foot 6 weight 193.  HEENT nontraumatic normocephalic EOMI PERRLA sclera anicteric, Cardiovascular regular rate and rhythm with S1-S2 no murmur gallop, Pulmonary clear bilaterally, Abdomen soft nontender nondistended no palpable mass or hepatosplenomegaly bowel sounds are active, Rectal exam there is an external hemorrhoid which is non-inflamed, on digital exam palpable internal hemorrhoid which is nonthrombosed nontender to exam, he does have irritation and some excoriations of the perianal skin.        Assessment & Plan:  #78 46 year old  male with symptomatic internal and external hemorrhoids, primarily bothered by Pruritis. He has had a single episode of internal hemorrhoid prolapse  Plan; rectal care regimen reviewed with the patient including moistened wipes, Tucks  pads and use of Colace when necessary. Will start Analpram 2.5% 3-4 times daily over the next 2 weeks and then as needed I advised Balmex cream for  perianal irritation. #2 history of complicated diverticulitis status post sigmoid resection 2011 currently asymptomatic #3 history of Barrett's esophagus. Due for followup surveillance. Schedule for upper endoscopy with Dr. Lina Sar. Continue Prilosec 40 mg by mouth daily.

## 2011-12-03 NOTE — Telephone Encounter (Signed)
Spoke with patient and he saw his PCP and was told to f/u with GI for itching hemorrhoids. Last night, he states one "popped out" and is now painful. He sits at his job and this is difficult. Scheduled with Mike Gip, PA today at 2:30 PM.

## 2011-12-04 ENCOUNTER — Encounter: Payer: Self-pay | Admitting: Internal Medicine

## 2011-12-04 NOTE — Progress Notes (Signed)
Reviewed and agree with management. Morgane Joerger D. Reginae Wolfrey, M.D., FACG  

## 2011-12-27 ENCOUNTER — Encounter: Payer: Self-pay | Admitting: Family Medicine

## 2011-12-27 ENCOUNTER — Ambulatory Visit (INDEPENDENT_AMBULATORY_CARE_PROVIDER_SITE_OTHER): Payer: Federal, State, Local not specified - PPO | Admitting: Family Medicine

## 2011-12-27 VITALS — BP 114/78 | HR 74 | Temp 98.1°F | Wt 191.0 lb

## 2011-12-27 DIAGNOSIS — J329 Chronic sinusitis, unspecified: Secondary | ICD-10-CM

## 2011-12-27 DIAGNOSIS — K2270 Barrett's esophagus without dysplasia: Secondary | ICD-10-CM

## 2011-12-27 MED ORDER — CEFUROXIME AXETIL 500 MG PO TABS: 500.0000 mg | ORAL_TABLET | Freq: Two times a day (BID) | ORAL | Status: AC

## 2011-12-27 MED ORDER — PRAMOXINE-HC 1-2.5 % EX CREA: TOPICAL_CREAM | CUTANEOUS | Status: AC

## 2011-12-27 NOTE — Progress Notes (Signed)
  Subjective:     Nathan Park is a 46 y.o. male who presents for evaluation of sinus pain. Symptoms include: congestion, foul rhinorrhea, headaches, nasal congestion and sinus pressure. Onset of symptoms was 2 months ago. Symptoms have been unchanged since that time. Past history is significant for no history of pneumonia or bronchitis. Patient is a non-smoker.  Pt wife can not stand the smell and insisted he go to Doctor.  The following portions of the patient's history were reviewed and updated as appropriate: allergies, current medications, past family history, past medical history, past social history, past surgical history and problem list.  Review of Systems Pertinent items are noted in HPI.   Objective:    BP 114/78  Pulse 74  Temp(Src) 98.1 F (36.7 C) (Oral)  Wt 191 lb (86.637 kg) General appearance: alert, cooperative, appears stated age and no distress Ears: normal TM's and external ear canals both ears Nose: no discharge, mild congestion, turbinates red, sinus tenderness bilateral Throat: abnormal findings: pnd Neck: no adenopathy and thyroid not enlarged, symmetric, no tenderness/mass/nodules Lungs: clear to auscultation bilaterally Lymph nodes: Cervical, supraclavicular, and axillary nodes normal.    Assessment:    Acute atypical sinusitis.    Plan:    Nasal saline sprays. Nasal steroids per medication orders. Ceftin per medication orders. Follow up in 1 week or as needed. -- if symptoms do not improve

## 2011-12-27 NOTE — Patient Instructions (Signed)

## 2012-01-14 ENCOUNTER — Ambulatory Visit (INDEPENDENT_AMBULATORY_CARE_PROVIDER_SITE_OTHER): Payer: Federal, State, Local not specified - PPO | Admitting: Internal Medicine

## 2012-01-14 ENCOUNTER — Encounter: Payer: Self-pay | Admitting: Internal Medicine

## 2012-01-14 VITALS — BP 110/70 | HR 70 | Temp 98.7°F | Resp 16 | Wt 193.0 lb

## 2012-01-14 DIAGNOSIS — J019 Acute sinusitis, unspecified: Secondary | ICD-10-CM

## 2012-01-14 MED ORDER — MOXIFLOXACIN HCL 400 MG PO TABS: 400.0000 mg | ORAL_TABLET | Freq: Every day | ORAL | Status: AC

## 2012-01-14 NOTE — Assessment & Plan Note (Signed)
Treat the infection with Avelox

## 2012-01-14 NOTE — Progress Notes (Signed)
Subjective:    Patient ID: Nathan Park, male    DOB: Jun 20, 1966, 46 y.o.   MRN: [id number]  Sinusitis This is a new problem. The current episode started 1 to 4 weeks ago. The problem has been gradually worsening since onset. There has been no fever. The fever has been present for less than 1 day. His pain is at a severity of 0/10. He is experiencing no pain. Associated symptoms include sinus pressure. Pertinent negatives include no chills, congestion, coughing, diaphoresis, ear pain, headaches, hoarse voice, neck pain, shortness of breath, sneezing, sore throat or swollen glands. Past treatments include nothing.      Review of Systems  Constitutional: Negative for fever, chills, diaphoresis, activity change, appetite change, fatigue and unexpected weight change.  HENT: Positive for rhinorrhea, postnasal drip and sinus pressure. Negative for hearing loss, ear pain, nosebleeds, congestion, sore throat, hoarse voice, facial swelling, sneezing, drooling, mouth sores, trouble swallowing, neck pain, neck stiffness, dental problem, voice change, tinnitus and ear discharge.   Eyes: Negative.   Respiratory: Negative for apnea, cough, choking, chest tightness, shortness of breath, wheezing and stridor.   Cardiovascular: Negative for chest pain, palpitations and leg swelling.  Gastrointestinal: Negative for nausea, vomiting, abdominal pain, diarrhea, constipation, blood in stool and abdominal distention.  Genitourinary: Negative for dysuria, urgency, frequency, hematuria, flank pain, decreased urine volume, enuresis and difficulty urinating.  Musculoskeletal: Negative for myalgias, back pain, joint swelling, arthralgias and gait problem.  Skin: Negative for color change, pallor, rash and wound.  Neurological: Negative for dizziness, tremors, seizures, syncope, facial asymmetry, speech difficulty, weakness, light-headedness, numbness and headaches.  Hematological: Negative for adenopathy. Does not  bruise/bleed easily.  Psychiatric/Behavioral: Negative.        Objective:   Physical Exam  Vitals reviewed. Constitutional: He is oriented to person, place, and time. He appears well-developed and well-nourished. No distress.  HENT:  Head: Normocephalic and atraumatic. No trismus in the jaw.  Right Ear: Hearing, tympanic membrane, external ear and ear canal normal.  Left Ear: Hearing, external ear and ear canal normal.  Nose: Mucosal edema and rhinorrhea present. No nose lacerations, sinus tenderness, nasal deformity, septal deviation or nasal septal hematoma. No epistaxis.  No foreign bodies. Right sinus exhibits maxillary sinus tenderness. Right sinus exhibits no frontal sinus tenderness. Left sinus exhibits maxillary sinus tenderness. Left sinus exhibits no frontal sinus tenderness.  Mouth/Throat: Oropharynx is clear and moist and mucous membranes are normal. Mucous membranes are not pale, not dry and not cyanotic. No uvula swelling. No oropharyngeal exudate, posterior oropharyngeal edema, posterior oropharyngeal erythema or tonsillar abscesses.  Eyes: Conjunctivae are normal. Right eye exhibits no discharge. Left eye exhibits no discharge. No scleral icterus.  Neck: Normal range of motion. Neck supple. No JVD present. No tracheal deviation present. No thyromegaly present.  Cardiovascular: Normal rate, regular rhythm, normal heart sounds and intact distal pulses.  Exam reveals no gallop and no friction rub.   No murmur heard. Pulmonary/Chest: Effort normal and breath sounds normal. No stridor. No respiratory distress. He has no wheezes. He has no rales. He exhibits no tenderness.  Abdominal: Soft. Bowel sounds are normal. He exhibits no distension and no mass. There is no tenderness. There is no rebound and no guarding.  Musculoskeletal: Normal range of motion. He exhibits no edema and no tenderness.  Lymphadenopathy:    He has no cervical adenopathy.  Neurological: He is oriented to  person, place, and time.  Skin: Skin is warm and dry. No rash noted. He is  not diaphoretic. No erythema. No pallor.  Psychiatric: He has a normal mood and affect. His behavior is normal. Judgment and thought content normal.      Lab Results  Component Value Date   WBC 7.7 05/22/2011   HGB 15.7 05/22/2011   HCT 46.2 05/22/2011   PLT 214.0 05/22/2011   GLUCOSE 102* 10/16/2011   CHOL 124 10/16/2011   TRIG 75.0 10/16/2011   HDL 37.00* 10/16/2011   LDLCALC 72 10/16/2011   ALT 28 10/16/2011   AST 20 10/16/2011   NA 138 10/16/2011   K 4.0 10/16/2011   CL 101 10/16/2011   CREATININE 1.0 10/16/2011   BUN 20 10/16/2011   CO2 29 10/16/2011   TSH 0.75 05/22/2011   INR 1.08 03/25/2010   HGBA1C 6.2 10/16/2011   MICROALBUR 1.1 05/22/2011      Assessment & Plan:

## 2012-01-14 NOTE — Patient Instructions (Signed)

## 2012-01-23 ENCOUNTER — Encounter: Payer: Self-pay | Admitting: *Deleted

## 2012-01-23 ENCOUNTER — Ambulatory Visit (AMBULATORY_SURGERY_CENTER): Payer: Federal, State, Local not specified - PPO | Admitting: Internal Medicine

## 2012-01-23 ENCOUNTER — Encounter: Payer: Self-pay | Admitting: Internal Medicine

## 2012-01-23 VITALS — BP 117/78 | HR 86 | Temp 97.1°F | Resp 16 | Ht 66.0 in | Wt 193.0 lb

## 2012-01-23 DIAGNOSIS — K2100 Gastro-esophageal reflux disease with esophagitis, without bleeding: Secondary | ICD-10-CM

## 2012-01-23 DIAGNOSIS — K2270 Barrett's esophagus without dysplasia: Secondary | ICD-10-CM

## 2012-01-23 LAB — GLUCOSE, CAPILLARY
Glucose-Capillary: 138 mg/dL — ABNORMAL HIGH (ref 70–99)
Glucose-Capillary: 119 mg/dL — ABNORMAL HIGH (ref 70–99)

## 2012-01-23 MED ORDER — FAMOTIDINE 40 MG PO TABS: 40.0000 mg | ORAL_TABLET | Freq: Every evening | ORAL | Status: DC | PRN

## 2012-01-23 MED ORDER — SODIUM CHLORIDE 0.9 % IV SOLN: 500.0000 mL | INTRAVENOUS | Status: DC

## 2012-01-23 NOTE — Op Note (Signed)
[facility] [address] [place], [place]  [address]  ENDOSCOPY PROCEDURE REPORT  PATIENT:  Park, Nathan  MR#:  [id number] BIRTHDATE:  [birth year], 45 yrs. old  GENDER:  male  ENDOSCOPIST:  Hedwig Morton. Juanda Chance, MD Referred by:  Etta Grandchild, M.D.  PROCEDURE DATE:  01/23/2012 PROCEDURE:  EGD with biopsy, [id number] ASA CLASS:  Class II INDICATIONS:  h/o Barrett's Esophagus hx of Barrett's diagnosed elsewhere, this is a follow up exam  MEDICATIONS:   MAC sedation, administered by CRNA, propofol (Diprivan) 180 mg TOPICAL ANESTHETIC:  none  DESCRIPTION OF PROCEDURE:   After the risks benefits and alternatives of the procedure were thoroughly explained, informed consent was obtained.  [facility] [id number] [id number] endoscope was introduced through the mouth and advanced to the second portion of the duodenum, without limitations.  The instrument was slowly withdrawn as the mucosa was fully examined. <<PROCEDUREIMAGES>>  Barrett's esophagus was found. 6 cm long Barret's esophagus extending from 32 to 38 cm, irregular z-line Multiple biopsies were obtained and sent to pathology (see image1, image2, image5, image6, image7, and image9).  Otherwise the examination was normal (see image4 and image3).  Esophagitis was found (see image2). linear erosions distal esophagus    Retroflexed views revealed no abnormalities.    The scope was then withdrawn from the patient and the procedure completed.  COMPLICATIONS:  None  ENDOSCOPIC IMPRESSION: 1) Barrett's esophagus 2) Otherwise normal examination long segment Barrett's esophagus with esophagitis, essentially no LES noted RECOMMENDATIONS: 1) Await pathology results 2) Anti-reflux regimen to be follow Prelosec 40 mg qd and Pepcid 40 mg prn OV to discuss Nissen Fundoplecation  REPEAT EXAM:  In 2 year(s) for.  ______________________________ Hedwig Morton. Juanda Chance, MD  CC:  n. eSIGNED:   Hedwig Morton. Nell Schrack at 01/23/2012 12:02 PM  Rosanne Ashing,  [id number]

## 2012-01-23 NOTE — Progress Notes (Signed)
Patient did not experience any of the following events: a burn prior to discharge; a fall within the facility; wrong site/side/patient/procedure/implant event; or a hospital transfer or hospital admission upon discharge from the facility. (G8907) Patient did not have preoperative order for IV antibiotic SSI prophylaxis. (G8918)  

## 2012-01-23 NOTE — Patient Instructions (Addendum)
Biopsies for Barrett's esophagus. Prilosec 40 mg daily and Pepcid 40 mg prn Office Visit to discuss Nissen Fundiplication Repeat exam in 2 years    YOU HAD AN ENDOSCOPIC PROCEDURE TODAY AT THE [facility]: Refer to the procedure report that was given to you for any specific questions about what was found during the examination.  If the procedure report does not answer your questions, please call your gastroenterologist to clarify.  If you requested that your care partner not be given the details of your procedure findings, then the procedure report has been included in a sealed envelope for you to review at your convenience later.  YOU SHOULD EXPECT: Some feelings of bloating in the abdomen. Passage of more gas than usual.  Walking can help get rid of the air that was put into your GI tract during the procedure and reduce the bloating. If you had a lower endoscopy (such as a colonoscopy or flexible sigmoidoscopy) you may notice spotting of blood in your stool or on the toilet paper. If you underwent a bowel prep for your procedure, then you may not have a normal bowel movement for a few days.  DIET: Your first meal following the procedure should be a light meal and then it is ok to progress to your normal diet.  A half-sandwich or bowl of soup is an example of a good first meal.  Heavy or fried foods are harder to digest and may make you feel nauseous or bloated.  Likewise meals heavy in dairy and vegetables can cause extra gas to form and this can also increase the bloating.  Drink plenty of fluids but you should avoid alcoholic beverages for 24 hours.  ACTIVITY: Your care partner should take you home directly after the procedure.  You should plan to take it easy, moving slowly for the rest of the day.  You can resume normal activity the day after the procedure however you should NOT DRIVE or use heavy machinery for 24 hours (because of the sedation medicines used during the test).     SYMPTOMS TO REPORT IMMEDIATELY: A gastroenterologist can be reached at any hour.  During normal business hours, 8:30 AM to 5:00 PM Monday through Friday, call [phone].  After hours and on weekends, please call the GI answering service at [phone] who will take a message and have the physician on call contact you.   Following lower endoscopy (colonoscopy or flexible sigmoidoscopy):  Excessive amounts of blood in the stool  Significant tenderness or worsening of abdominal pains  Swelling of the abdomen that is new, acute  Fever of 100F or higher  Following upper endoscopy (EGD)  Vomiting of blood or coffee ground material  New chest pain or pain under the shoulder blades  Painful or persistently difficult swallowing  New shortness of breath  Fever of 100F or higher  Black, tarry-looking stools  FOLLOW UP: If any biopsies were taken you will be contacted by phone or by letter within the next 1-3 weeks.  Call your gastroenterologist if you have not heard about the biopsies in 3 weeks.  Our staff will call the home number listed on your records the next business day following your procedure to check on you and address any questions or concerns that you may have at that time regarding the information given to you following your procedure. This is a courtesy call and so if there is no answer at the home number and we have not heard from  you through the emergency physician on call, we will assume that you have returned to your regular daily activities without incident.  SIGNATURES/CONFIDENTIALITY: You and/or your care partner have signed paperwork which will be entered into your electronic medical record.  These signatures attest to the fact that that the information above on your After Visit Summary has been reviewed and is understood.  Full responsibility of the confidentiality of this discharge information lies with you and/or your care-partner.

## 2012-01-26 ENCOUNTER — Telehealth: Payer: Self-pay | Admitting: *Deleted

## 2012-01-26 NOTE — Telephone Encounter (Signed)
  Follow up Call-  Call back number 01/23/2012  Post procedure Call Back phone  # [phone]  Permission to leave phone message Yes     No answer and answering machine did not pick up to leave a message

## 2012-01-27 ENCOUNTER — Encounter: Payer: Self-pay | Admitting: Internal Medicine

## 2012-02-04 ENCOUNTER — Ambulatory Visit (INDEPENDENT_AMBULATORY_CARE_PROVIDER_SITE_OTHER): Payer: Federal, State, Local not specified - PPO | Admitting: Internal Medicine

## 2012-02-04 ENCOUNTER — Encounter: Payer: Self-pay | Admitting: Internal Medicine

## 2012-02-04 VITALS — BP 110/70 | HR 72 | Ht 66.0 in | Wt 194.5 lb

## 2012-02-04 DIAGNOSIS — K2270 Barrett's esophagus without dysplasia: Secondary | ICD-10-CM

## 2012-02-04 DIAGNOSIS — K219 Gastro-esophageal reflux disease without esophagitis: Secondary | ICD-10-CM

## 2012-02-04 NOTE — Patient Instructions (Addendum)
You have been scheduled for a Barium Esophogram at [facility] (1st floor of the hospital) on 02/06/12 at 10:30 am. Please arrive 15 minutes prior to your appointment for registration. Make certain not to have anything to eat or drink 6 hours prior to your test. If you need to reschedule for any reason, please contact radiology at [phone] to do so. You have been scheduled for an appointment with Dr Gerrit Friends at [facility]. Your appointment is on 03/03/12 at 11:30 am. Please arrive at 11:00 am for registration. Make certain to bring a list of current medications, including any over the counter medications or vitamins. Also bring your co-pay if you have one as well as your insurance cards. [facility] is located at [address], [address]. Should you need to reschedule your appointment, please contact them at [phone]. CC: Dr Sanda Linger, Dr Gerrit Friends

## 2012-02-04 NOTE — Progress Notes (Signed)
Stella Encarnacion 16-Dec-1965 MRN [id number]   History of Present Illness:  This is a 46 year old white male with long segment Barrett's esophagus reconfirmed on a recent upper endoscopy on 01/23/2012, showing a 6 cm segment of intestinal metaplasia, goblet cells and intraepithelial eosinophils. On EGD, we found esophageal erosions indicating ongoing reflux. He at that point was taking Prilosec 40 mg a day and was following antireflux measures. He since then has added Pepcid 40 mg in the mornings and has experienced complete relief of his heartburn. He denies any dysphagia. I have discussed with him the possibility of Nissen fundoplication and he came today to discuss it because he is interested. He has a history of a sigmoid resection for diverticulitis and a colovesicular fistula in August 2011. He has also had symptomatic hemorrhoids. His wife reports that when she met her husband, he was coughing at night and regurgitating food and acid. He was also coughing while eating. The symptoms markedly improved once he was started on PPIs.   Past Medical History  Diagnosis Date  . Diverticulosis   . Umbilical hernia   . GERD (gastroesophageal reflux disease)   . Undiagnosed cardiac murmurs   . Nephrolithiasis   . Sleep apnea   . Barrett esophagus 01/31/10    at 32 cm  . Hiatal hernia   . Hemorrhoids   . Rectal pain   . Diabetes mellitus    Past Surgical History  Procedure Date  . Sigmoid colectomy 04/01/10  . Take down of colovesical fistula 04/01/10  . Takedown of enterovesical fistula 04/01/10  . Repair umblical hernia repair 04/01/10  . Orif distal radius fracture 2009  . Carpal tunnel release   . Wrist surgery     left    reports that he has never smoked. He has never used smokeless tobacco. He reports that he drinks about 3 ounces of alcohol per week. He reports that he does not use illicit drugs. family history includes Diabetes in his father.  There is no history of Colon cancer, and Cancer,  and Heart disease, and Hypertension, and Kidney disease, . Allergies  Allergen Reactions  . Oxycodone Hcl Er     Itching and nausea         Review of Systems: Denies chest pain odynophagia or change in bowel habits The remainder of the 10 point ROS is negative except as outlined in H&P   Physical Exam: General appearance  Well developed, in no distress. Psychological normal mood and affect.  Assessment and Plan:  46 year old white male with severe ongoing gastroesophageal reflux reasonably well controlled on high-dose acid reducing agents. He has had long segment Barrett's esophagus and he is an appropriate candidate for Nissen fundoplication. We will obtain a barium esophagram to assess his peristalsis. He does not need 24 hour pH probe to prove that he has reflux. We will make a surgical referral. As to whether he will need esophageal manometry, I will leave that up to the surgeon to decide. He has not shown any symptoms of esophageal dysmotility which would preclude his Nissen fundoplication. He and his wife are aware of the fact that the Barrett's esophagus is not going to go away after fundoplication and that he will still need periodic surveillance for esophageal cancer. I also mentioned the possibility that he will still have to take some acid reducers after his surgery.   02/04/2012 Lina Sar

## 2012-02-06 ENCOUNTER — Ambulatory Visit (HOSPITAL_COMMUNITY)
Admission: RE | Admit: 2012-02-06 | Discharge: 2012-02-06 | Disposition: A | Discharge status: Home / Self Care | Payer: Federal, State, Local not specified - PPO | Source: Ambulatory Visit | Attending: Internal Medicine | Admitting: Internal Medicine

## 2012-02-06 DIAGNOSIS — K449 Diaphragmatic hernia without obstruction or gangrene: Secondary | ICD-10-CM | POA: Insufficient documentation

## 2012-02-06 DIAGNOSIS — K219 Gastro-esophageal reflux disease without esophagitis: Secondary | ICD-10-CM | POA: Insufficient documentation

## 2012-02-12 ENCOUNTER — Ambulatory Visit: Payer: Federal, State, Local not specified - PPO | Admitting: Internal Medicine

## 2012-03-03 ENCOUNTER — Ambulatory Visit (INDEPENDENT_AMBULATORY_CARE_PROVIDER_SITE_OTHER): Payer: Federal, State, Local not specified - PPO | Admitting: Surgery

## 2012-03-08 ENCOUNTER — Ambulatory Visit (INDEPENDENT_AMBULATORY_CARE_PROVIDER_SITE_OTHER): Payer: Federal, State, Local not specified - PPO | Admitting: General Surgery

## 2012-03-09 ENCOUNTER — Other Ambulatory Visit: Payer: Self-pay | Admitting: Internal Medicine

## 2012-03-12 ENCOUNTER — Ambulatory Visit (INDEPENDENT_AMBULATORY_CARE_PROVIDER_SITE_OTHER): Payer: Federal, State, Local not specified - PPO | Admitting: Surgery

## 2012-03-12 ENCOUNTER — Other Ambulatory Visit (INDEPENDENT_AMBULATORY_CARE_PROVIDER_SITE_OTHER): Payer: Self-pay | Admitting: Surgery

## 2012-03-12 ENCOUNTER — Encounter (INDEPENDENT_AMBULATORY_CARE_PROVIDER_SITE_OTHER): Payer: Self-pay | Admitting: Surgery

## 2012-03-12 VITALS — BP 114/78 | HR 70 | Temp 97.6°F | Resp 18 | Ht 66.0 in | Wt 192.2 lb

## 2012-03-12 DIAGNOSIS — Z531 Procedure and treatment not carried out because of patient's decision for reasons of belief and group pressure: Secondary | ICD-10-CM | POA: Insufficient documentation

## 2012-03-12 DIAGNOSIS — K2270 Barrett's esophagus without dysplasia: Secondary | ICD-10-CM

## 2012-03-12 DIAGNOSIS — IMO0001 Reserved for inherently not codable concepts without codable children: Secondary | ICD-10-CM | POA: Insufficient documentation

## 2012-03-12 DIAGNOSIS — K219 Gastro-esophageal reflux disease without esophagitis: Secondary | ICD-10-CM

## 2012-03-12 NOTE — Patient Instructions (Signed)
Continue on PPI's

## 2012-03-12 NOTE — Progress Notes (Signed)
Chief Complaint:  GERD and long segment Barrett's esophagus  History of Present Illness:  Nathan Park is an 46 y.o. male referred by Dr Lina Sar who comes with a long-standing history of gastroesophageal reflux which he didn't realize was not normal until he met his wife. He had lived on [place] to avoid pain. Proton pump inhibitors have made him symptomatically much better although he does sleep with the head of his bed elevated. I reviewed his upper GI series which showed a hiatal hernia with reflux. He has a long segment Barrett's esophagus followed by Dr. Juanda Chance.    I went over laparoscopic Nissen fundoplication with him and his wife in some detail. I advised him of the risk benefits not limited to failure but also to include organ injury and bleeding. 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  I gave him our pamphlet on Nissen fundoplication and explaining the operation and some detail. He was to proceed with this. We'll schedule at his convenience it was a long period  Within the last 2 years he had undergo a colectomy for a colovesical fistula by Dr. Gerrit Friends and he had a subsequent ventral hernia repair with mesh by him as well.   Past Medical History  Diagnosis Date  . Diverticulosis   . Umbilical hernia   . GERD (gastroesophageal reflux disease)   . Undiagnosed cardiac murmurs   . Nephrolithiasis   . Sleep apnea   . Barrett esophagus 01/31/10    at 32 cm  . Hiatal hernia   . Hemorrhoids   . Rectal pain   . Diabetes mellitus   . Nasal congestion   . Hiatal hernia     Past Surgical History  Procedure Date  . Sigmoid colectomy 04/01/10  . Take down of colovesical fistula 04/01/10  . Takedown of enterovesical fistula 04/01/10  . Repair umblical hernia repair 04/01/10  . Orif distal radius fracture 2009  . Carpal tunnel release   . Wrist surgery     left    Current Outpatient Prescriptions  Medication Sig Dispense  Refill  . azelastine (ASTELIN) 137 MCG/SPRAY nasal spray Place 1 spray into the nose 2 (two) times daily. Use in each nostril as directed  30 mL  12  . famotidine (PEPCID) 40 MG tablet Take 1 tablet (40 mg total) by mouth at bedtime as needed for heartburn.  30 tablet  6  . fluticasone (FLONASE) 50 MCG/ACT nasal spray Place 2 sprays into the nose daily.  16 g  11  . omeprazole (PRILOSEC) 40 MG capsule TAKE 1 CAPSULE DAILY  90 capsule  2  . pramoxine-hydrocortisone cream Apply to affected area 2-3 times daily (perianal skin)  30 g  1  . Saxagliptin HCl (ONGLYZA PO) Take by mouth as directed.      [name] (PREPARATION H EX) Apply topically as directed.      Marland Kitchen DISCONTD: Azelastine-Fluticasone (DYMISTA) 137-50 MCG/ACT SUSP Place 1 Act into the nose 2 (two) times daily.  3 Bottle  3   Oxycodone hcl er Family History  Problem Relation Age of Onset  . Diabetes Father   . Colon cancer Neg Hx   . Cancer Neg Hx   . Heart disease Neg Hx   . Hypertension Neg Hx   . Kidney disease Neg Hx    Social History:   reports that he has never smoked. He has never used smokeless tobacco. He reports that  he drinks about 3 ounces of alcohol per week. He reports that he does not use illicit drugs.   REVIEW OF SYSTEMS - PERTINENT POSITIVES ONLY: + Jehovah's witness  Physical Exam:   Blood pressure 114/78, pulse 70, temperature 97.6 F (36.4 C), temperature source Temporal, resp. rate 18, height 5\' 6"  (1.676 m), weight 192 lb 4 oz (87.204 kg). Body mass index is 31.03 kg/(m^2).  Gen:  WDWN WM NAD  Neurological: Alert and oriented to person, place, and time. Motor and sensory function is grossly intact  Head: Normocephalic and atraumatic.  Eyes: Conjunctivae are normal. Pupils are equal, round, and reactive to light. No scleral icterus.  Neck: Normal range of motion. Neck supple. No tracheal deviation or thyromegaly present.  Cardiovascular:  SR without murmurs or gallops.  No carotid  bruits Respiratory: Effort normal.  No respiratory distress. No chest wall tenderness. Breath sounds normal.  No wheezes, rales or rhonchi.  Abdomen:  Lower midline incision with no recurrent hernia.   GU: Musculoskeletal: Normal range of motion. Extremities are nontender. No cyanosis, edema or clubbing noted Lymphadenopathy: No cervical, preauricular, postauricular or axillary adenopathy is present Skin: Skin is warm and dry. No rash noted. No diaphoresis. No erythema. No pallor. Pscyh: Normal mood and affect. Behavior is normal. Judgment and thought content normal.   LABORATORY RESULTS: No results found for this or any previous visit (from the past 48 hour(s)).  RADIOLOGY RESULTS: No results found.  Problem List: Patient Active Problem List  Diagnosis  . Type II or unspecified type diabetes mellitus without mention of complication, uncontrolled  . Hemorrhoids, internal  . Allergic rhinitis, cause unspecified  . Pure hyperglyceridemia  . Diverticulosis  . Barrett's esophagus  . Acute sinusitis, unspecified    Assessment & Plan: Significant GERD and hiatus hernia with long segment Barrett's esophagus in a Jehovah's witness who refuses transfusion.  Lap Nissen fundoplication    Matt B. Daphine Deutscher, MD, [facility] [phone] beeper [phone]  03/12/2012 2:08 PM

## 2012-07-08 ENCOUNTER — Ambulatory Visit (INDEPENDENT_AMBULATORY_CARE_PROVIDER_SITE_OTHER): Payer: Federal, State, Local not specified - PPO | Admitting: Surgery

## 2012-07-08 VITALS — BP 128/78 | HR 76 | Temp 97.6°F | Resp 18 | Ht 66.0 in | Wt 194.0 lb

## 2012-07-08 DIAGNOSIS — K219 Gastro-esophageal reflux disease without esophagitis: Secondary | ICD-10-CM

## 2012-07-08 NOTE — Patient Instructions (Signed)
Nissen Fundoplication Care After Please read the instructions outlined below and refer to this sheet for the next few weeks. These discharge instructions provide you with general information on caring for yourself after you leave the hospital. Your doctor may also give you specific instructions. While your treatment has been planned according to the most current medical practices available, unavoidable complications sometimes happen. If you have any problems or questions after discharge, please call your doctor. ACTIVITY  Take frequent rest periods throughout the day.  Take frequent walks throughout the day. This will help to prevent blood clots.  Continue to do your coughing and deep breathing exercises once you get home. This will help to prevent pneumonia.  No strenuous activities such as heavy lifting, pushing or pulling until after your follow-up visit with your doctor. Do not lift anything heavier than 10 pounds.  Talk with your caregiver about when you may return to work and your exercise routine.  You may shower 2 days after surgery. Pat incisions dry. Do not rub incisions with washcloth or towel.  Do not drive while taking prescription pain medication. NUTRITION  Continue with a liquid diet, or the diet you were directed to take, until your first follow-up visit with your surgeon.  Drink fluids (6-8 glasses a day).  Call your caregiver for persistent nausea (feeling sick to your stomach), vomiting, bloating or difficulty swallowing. ELIMINATION It is very important not to strain during bowel movements. If constipation should occur, you may:  Take a mild laxative (such as Milk of Magnesia).  Add fruit and bran to your diet.  Drink more fluids.  Call your caregiver if constipation is not relieved. FEVER If you feel feverish or have shaking chills, take your temperature. If it is 102 F (38.9 C) or above, call your caregiver. The fever may mean there is an infection. PAIN  CONTROL  If a prescription was given for a pain reliever, please follow your caregiver's directions.  Only take over-the-counter or prescription medicines for pain, discomfort, or fever as directed by your caregiver.  If the pain is not relieved by your medicine, becomes worse, or you have difficulty breathing, call your doctor. INCISION  It is normal for your cuts (incisions) from surgery to have a small amount of drainage for the first 1-2 days. Once the drainage has stopped, leave your incision(s) open to air.  Check your incision(s) and surrounding area daily for any redness, swelling, increased drainage or bleeding. If any of these are present or if the wound edges start to separate, call your doctor.  If you have small adhesive strips in place, they will peel and fall off. (If these strips are covered with a clear bandage, your doctor will tell you when to remove them.)  If you have staples, your caregiver will remove them at the follow-up appointment. Document Released: 03/27/2004 Document Revised: 10/27/2011 Document Reviewed: 07/01/2007 Exit[employer] Patient Information 2013 [employer].  

## 2012-07-08 NOTE — Progress Notes (Signed)
Chief Complaint: GERD and long segment Barrett's esophagus  History of Present Illness: Nathan Park is an 46 y.o. male referred by Dr Lina Sar who comes with a long-standing history of gastroesophageal reflux which he didn't realize was not normal until he met his wife. He had lived on [place] to avoid pain. Proton pump inhibitors have made him symptomatically much better although he does sleep with the head of his bed elevated. I reviewed his upper GI series which showed a hiatal hernia with reflux. He has a long segment Barrett's esophagus followed by Dr. Juanda Chance.  I went over laparoscopic Nissen fundoplication with him and his wife in some detail. I advised him of the risk benefits not limited to failure but also to include organ injury and bleeding. 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  I gave him our pamphlet on Nissen fundoplication and explaining the operation and some detail. He was to proceed with this. We'll schedule at his convenience it was a long period  Within the last 2 years he had undergo a colectomy for a colovesical fistula by Dr. Gerrit Friends and he had a subsequent ventral hernia repair with mesh by him as well.  Past Medical History   Diagnosis  Date   .  Diverticulosis    .  Umbilical hernia    .  GERD (gastroesophageal reflux disease)    .  Undiagnosed cardiac murmurs    .  Nephrolithiasis    .  Sleep apnea    .  Barrett esophagus  01/31/10     at 32 cm   .  Hiatal hernia    .  Hemorrhoids    .  Rectal pain    .  Diabetes mellitus    .  Nasal congestion    .  Hiatal hernia     Past Surgical History   Procedure  Date   .  Sigmoid colectomy  04/01/10   .  Take down of colovesical fistula  04/01/10   .  Takedown of enterovesical fistula  04/01/10   .  Repair umblical hernia repair  04/01/10   .  Orif distal radius fracture  2009   .  Carpal tunnel release    .  Wrist surgery      left    Current Outpatient  Prescriptions   Medication  Sig  Dispense  Refill   .  azelastine (ASTELIN) 137 MCG/SPRAY nasal spray  Place 1 spray into the nose 2 (two) times daily. Use in each nostril as directed  30 mL  12   .  famotidine (PEPCID) 40 MG tablet  Take 1 tablet (40 mg total) by mouth at bedtime as needed for heartburn.  30 tablet  6   .  fluticasone (FLONASE) 50 MCG/ACT nasal spray  Place 2 sprays into the nose daily.  16 g  11   .  omeprazole (PRILOSEC) 40 MG capsule  TAKE 1 CAPSULE DAILY  90 capsule  2   .  pramoxine-hydrocortisone cream  Apply to affected area 2-3 times daily (perianal skin)  30 g  1   .  Saxagliptin HCl (ONGLYZA PO)  Take by mouth as directed.     [name] (PREPARATION H EX)  Apply topically as directed.     Marland Kitchen  DISCONTD: Azelastine-Fluticasone (DYMISTA) 137-50 MCG/ACT SUSP  Place 1 Act into the nose 2 (two) times daily.  3 Bottle  3  Oxycodone hcl er  Family History   Problem  Relation  Age of Onset   .  Diabetes  Father    .  Colon cancer  Neg Hx    .  Cancer  Neg Hx    .  Heart disease  Neg Hx    .  Hypertension  Neg Hx    .  Kidney disease  Neg Hx     Social History: reports that he has never smoked. He has never used smokeless tobacco. He reports that he drinks about 3 ounces of alcohol per week. He reports that he does not use illicit drugs.  REVIEW OF SYSTEMS - PERTINENT POSITIVES ONLY:  + Jehovah's witness  Physical Exam:  Blood pressure 114/78, pulse 70, temperature 97.6 F (36.4 C), temperature source Temporal, resp. rate 18, height 5\' 6"  (1.676 m), weight 192 lb 4 oz (87.204 kg).  Body mass index is 31.03 kg/(m^2).  Gen: WDWN WM NAD  Neurological: Alert and oriented to person, place, and time. Motor and sensory function is grossly intact  Head: Normocephalic and atraumatic.  Eyes: Conjunctivae are normal. Pupils are equal, round, and reactive to light. No scleral icterus.  Neck: Normal range of motion. Neck supple. No tracheal deviation or thyromegaly present.   Cardiovascular: SR without murmurs or gallops. No carotid bruits  Respiratory: Effort normal. No respiratory distress. No chest wall tenderness. Breath sounds normal. No wheezes, rales or rhonchi.  Abdomen: Lower midline incision with no recurrent hernia.  GU:  Musculoskeletal: Normal range of motion. Extremities are nontender. No cyanosis, edema or clubbing noted Lymphadenopathy: No cervical, preauricular, postauricular or axillary adenopathy is present Skin: Skin is warm and dry. No rash noted. No diaphoresis. No erythema. No pallor. Pscyh: Normal mood and affect. Behavior is normal. Judgment and thought content normal.  LABORATORY RESULTS:  No results found for this or any previous visit (from the past 48 hour(s)).  RADIOLOGY RESULTS:  No results found.  Problem List:  Patient Active Problem List   Diagnosis   .  Type II or unspecified type diabetes mellitus without mention of complication, uncontrolled   .  Hemorrhoids, internal   .  Allergic rhinitis, cause unspecified   .  Pure hyperglyceridemia   .  Diverticulosis   .  Barrett's esophagus   .  Acute sinusitis, unspecified    Assessment & Plan:  Significant GERD and hiatus hernia with long segment Barrett's esophagus in a Jehovah's witness who refuses transfusion.  Lap Nissen fundoplication   I had answered all the questions that he and his wife had when I saw them back in July. I saw him today he stated he had raise the money for the down payment surgery and was ready to get scheduled. He felt like he was very well prepped and had no further questions about the Nissen fundoplication. I reiterated the possibility of his having to be done open since Dr. Gerrit Friends put the mesh in his abdomen. Matt B. Daphine Deutscher, MD, [facility]  [phone] beeper  [phone]

## 2012-07-30 ENCOUNTER — Encounter (INDEPENDENT_AMBULATORY_CARE_PROVIDER_SITE_OTHER): Payer: Federal, State, Local not specified - PPO | Admitting: Surgery

## 2012-08-09 ENCOUNTER — Telehealth (INDEPENDENT_AMBULATORY_CARE_PROVIDER_SITE_OTHER): Payer: Self-pay | Admitting: General Surgery

## 2012-08-09 NOTE — Telephone Encounter (Signed)
Spoke with pt and answered his questions regarding surgery.  Informed him there are usually 5 spot that they enter the stomach when it is laparoscopic. Informed him if there are complications there is always the chance of having to do an open procedure.  Explained that he will be on a liquid and soft diet for about 2 weeks until his first PO appt and then Dr. Daphine Deutscher would explain how he may advance his diet.  Also explained that the pre-admission will call him within 3-7 days prior to surgery to set up that appt.

## 2012-08-09 NOTE — Telephone Encounter (Signed)
Message copied by Littie Deeds on Mon Aug 09, 2012  5:01 PM ------      Message from: Zollie Scale      Created: Mon Aug 09, 2012  1:40 PM      Contact: [phone]       Pt need post op apt also has questions about what he need to do before surgery per Deanna to send message to you.

## 2012-08-13 ENCOUNTER — Encounter (HOSPITAL_COMMUNITY): Payer: Self-pay | Admitting: Pharmacy Technician

## 2012-08-17 ENCOUNTER — Encounter (HOSPITAL_COMMUNITY): Payer: Self-pay

## 2012-08-17 ENCOUNTER — Encounter (HOSPITAL_COMMUNITY)
Admission: RE | Admit: 2012-08-17 | Discharge: 2012-08-17 | Disposition: A | Discharge status: Home / Self Care | Payer: Federal, State, Local not specified - PPO | Source: Ambulatory Visit | Attending: Surgery | Admitting: Surgery

## 2012-08-17 ENCOUNTER — Ambulatory Visit (HOSPITAL_COMMUNITY)
Admission: RE | Admit: 2012-08-17 | Discharge: 2012-08-17 | Disposition: A | Discharge status: Home / Self Care | Payer: Federal, State, Local not specified - PPO | Source: Ambulatory Visit | Attending: Surgery | Admitting: Surgery

## 2012-08-17 DIAGNOSIS — Z01818 Encounter for other preprocedural examination: Secondary | ICD-10-CM | POA: Insufficient documentation

## 2012-08-17 DIAGNOSIS — E119 Type 2 diabetes mellitus without complications: Secondary | ICD-10-CM | POA: Insufficient documentation

## 2012-08-17 HISTORY — DX: Headache: R51

## 2012-08-17 HISTORY — DX: Procedure and treatment not carried out because of patient's decision for reasons of belief and group pressure: Z53.1

## 2012-08-17 HISTORY — DX: Adverse effect of unspecified anesthetic, initial encounter: T41.45XA

## 2012-08-17 HISTORY — DX: Other complications of anesthesia, initial encounter: T88.59XA

## 2012-08-17 HISTORY — DX: Reserved for inherently not codable concepts without codable children: IMO0001

## 2012-08-17 LAB — CBC
HCT: 43.5 % (ref 39.0–52.0)
MCH: 28.7 pg (ref 26.0–34.0)
MCV: 85 fL (ref 78.0–100.0)
Platelets: 205 10*3/uL (ref 150–400)
RDW: 12.6 % (ref 11.5–15.5)

## 2012-08-17 LAB — BASIC METABOLIC PANEL
CO2: 29 mEq/L (ref 19–32)
Calcium: 9.6 mg/dL (ref 8.4–10.5)
Glucose, Bld: 127 mg/dL — ABNORMAL HIGH (ref 70–99)
Potassium: 4.1 mEq/L (ref 3.5–5.1)
Sodium: 141 mEq/L (ref 135–145)

## 2012-08-17 NOTE — Patient Instructions (Signed)
Nathan Park  08/17/2012                           YOUR PROCEDURE IS SCHEDULED ON:  08/25/12               PLEASE REPORT TO SHORT STAY CENTER AT :  6:15 AM               CALL THIS NUMBER IF ANY PROBLEMS THE DAY OF SURGERY :               [phone]                      REMEMBER:   Do not eat food or drink liquids AFTER MIDNIGHT   Take these medicines the morning of surgery with A SIP OF WATER:  PEPCID / XANAX IF NEEDED   Do not wear jewelry, make-up   Do not wear lotions, powders, or perfumes.   Do not shave legs or underarms 12 hrs. before surgery (men may shave face)  Do not bring valuables to the hospital.  Contacts, dentures or bridgework may not be worn into surgery.  Leave suitcase in the car. After surgery it may be brought to your room.  For patients admitted to the hospital more than one night, checkout time is 11:00                          The day of discharge.   Patients discharged the day of surgery will not be allowed to drive home                             If going home same day of surgery, must have someone stay with you first                           24 hrs at home and arrange for some one to drive you home from hospital.    Special Instructions:   Please read over the following fact sheets that you were given:               1. MRSA  INFORMATION                      2. [facility] PREPARING FOR SURGERY SHEET               3. USE FLEET ENEMA THE NIGHT BEFORE SURGERY               4. STOP ASPIRIN AND HERBAL PRODUCTS 5 DAYS PREOP                                                X_____________________________________________________________________        Failure to follow these instructions may result in cancellation of your surgery

## 2012-08-17 NOTE — Progress Notes (Signed)
08/17/12 0923  OBSTRUCTIVE SLEEP APNEA  Have you ever been diagnosed with sleep apnea through a sleep study? No  Do you snore loudly (loud enough to be heard through closed doors)?  1  Do you often feel tired, fatigued, or sleepy during the daytime? 1  Has anyone observed you stop breathing during your sleep? 1  Do you have, or are you being treated for high blood pressure? 0  BMI more than 35 kg/m2? 0  Age over 48 years old? 0  Neck circumference greater than 40 cm/18 inches? 0  Gender: 1  Obstructive Sleep Apnea Score 4   Score 4 or greater  Results sent to PCP

## 2012-08-24 NOTE — Anesthesia Preprocedure Evaluation (Addendum)
Anesthesia Evaluation  Patient identified by MRN, date of birth, ID band Patient awake    Reviewed: Allergy & Precautions, H&P , NPO status , Patient's Chart, lab work & pertinent test results  History of Anesthesia Complications (+) PROLONGED EMERGENCE  Airway Mallampati: II TM Distance: >3 FB Neck ROM: full    Dental No notable dental hx. (+) Teeth Intact and Dental Advisory Given   Pulmonary neg pulmonary ROS,  breath sounds clear to auscultation  Pulmonary exam normal       Cardiovascular Exercise Tolerance: Good negative cardio ROS  Rhythm:regular Rate:Normal     Neuro/Psych negative neurological ROS  negative psych ROS   GI/Hepatic Neg liver ROS, hiatal hernia, GERD-  Medicated and Controlled,  Endo/Other  diabetes, Well Controlled, Type 2, Oral Hypoglycemic Agents  Renal/GU negative Renal ROS  negative genitourinary   Musculoskeletal   Abdominal   Peds  Hematology negative hematology ROS (+)   Anesthesia Other Findings   Reproductive/Obstetrics negative OB ROS                         Anesthesia Physical Anesthesia Plan  ASA: III  Anesthesia Plan: General   Post-op Pain Management:    Induction: Intravenous  Airway Management Planned: Oral ETT  Additional Equipment:   Intra-op Plan:   Post-operative Plan: Extubation in OR  Informed Consent: I have reviewed the patients History and Physical, chart, labs and discussed the procedure including the risks, benefits and alternatives for the proposed anesthesia with the patient or authorized representative who has indicated his/her understanding and acceptance.   Dental Advisory Given  Plan Discussed with: CRNA and Surgeon  Anesthesia Plan Comments:         Anesthesia Quick Evaluation

## 2012-08-25 ENCOUNTER — Encounter (HOSPITAL_COMMUNITY)
Admission: RE | Disposition: A | Discharge status: Home / Self Care | Payer: Self-pay | Source: Ambulatory Visit | Attending: Surgery

## 2012-08-25 ENCOUNTER — Inpatient Hospital Stay (HOSPITAL_COMMUNITY)
Admission: RE | Admit: 2012-08-25 | Discharge: 2012-08-27 | DRG: 154 | Disposition: A | Discharge status: Home / Self Care | Payer: Federal, State, Local not specified - PPO | Source: Ambulatory Visit | Attending: Surgery | Admitting: Surgery

## 2012-08-25 ENCOUNTER — Inpatient Hospital Stay (HOSPITAL_COMMUNITY): Payer: Federal, State, Local not specified - PPO | Admitting: Anesthesiology

## 2012-08-25 ENCOUNTER — Encounter (HOSPITAL_COMMUNITY): Payer: Self-pay | Admitting: *Deleted

## 2012-08-25 ENCOUNTER — Encounter (HOSPITAL_COMMUNITY): Payer: Self-pay | Admitting: Anesthesiology

## 2012-08-25 DIAGNOSIS — IMO0001 Reserved for inherently not codable concepts without codable children: Secondary | ICD-10-CM | POA: Diagnosis present

## 2012-08-25 DIAGNOSIS — G4730 Sleep apnea, unspecified: Secondary | ICD-10-CM | POA: Diagnosis present

## 2012-08-25 DIAGNOSIS — Z9889 Other specified postprocedural states: Secondary | ICD-10-CM | POA: Diagnosis not present

## 2012-08-25 DIAGNOSIS — K2270 Barrett's esophagus without dysplasia: Secondary | ICD-10-CM | POA: Diagnosis present

## 2012-08-25 DIAGNOSIS — K449 Diaphragmatic hernia without obstruction or gangrene: Secondary | ICD-10-CM

## 2012-08-25 DIAGNOSIS — K219 Gastro-esophageal reflux disease without esophagitis: Secondary | ICD-10-CM | POA: Diagnosis present

## 2012-08-25 DIAGNOSIS — E781 Pure hyperglyceridemia: Secondary | ICD-10-CM | POA: Diagnosis present

## 2012-08-25 DIAGNOSIS — Z79899 Other long term (current) drug therapy: Secondary | ICD-10-CM

## 2012-08-25 DIAGNOSIS — Z9049 Acquired absence of other specified parts of digestive tract: Secondary | ICD-10-CM

## 2012-08-25 DIAGNOSIS — Z8719 Personal history of other diseases of the digestive system: Secondary | ICD-10-CM

## 2012-08-25 HISTORY — PX: LAPAROSCOPIC NISSEN FUNDOPLICATION: SHX1932

## 2012-08-25 LAB — CBC
HCT: 40.9 % (ref 39.0–52.0)
Hemoglobin: 14.6 g/dL (ref 13.0–17.0)
MCHC: 35.7 g/dL (ref 30.0–36.0)
MCV: 84.2 fL (ref 78.0–100.0)
RDW: 12.5 % (ref 11.5–15.5)

## 2012-08-25 LAB — CREATININE, SERUM
GFR calc Af Amer: >90 mL/min (ref >90)
GFR calc non Af Amer: >90 mL/min (ref >90)

## 2012-08-25 LAB — GLUCOSE, CAPILLARY: Glucose-Capillary: 185 mg/dL — ABNORMAL HIGH (ref 70–99)

## 2012-08-25 SURGERY — FUNDOPLICATION, NISSEN, LAPAROSCOPIC
Anesthesia: General | Site: Abdomen | Wound class: Clean

## 2012-08-25 MED ORDER — ONDANSETRON HCL 4 MG/2ML IJ SOLN
4.0000 mg | Freq: Four times a day (QID) | INTRAMUSCULAR | Status: DC | PRN
  Administered 2012-08-25: 4 mg via INTRAVENOUS
  Filled 2012-08-25: qty 2

## 2012-08-25 MED ORDER — CHLORHEXIDINE GLUCONATE 4 % EX LIQD
1.0000 "application " | Freq: Once | CUTANEOUS | Status: DC
  Filled 2012-08-25: qty 15

## 2012-08-25 MED ORDER — NEOSTIGMINE METHYLSULFATE 1 MG/ML IJ SOLN
INTRAMUSCULAR | Status: DC | PRN
  Administered 2012-08-25: 4 mg via INTRAVENOUS

## 2012-08-25 MED ORDER — FLEET ENEMA 7-19 GM/118ML RE ENEM: 1.0000 | ENEMA | Freq: Once | RECTAL | Status: DC

## 2012-08-25 MED ORDER — GLYCOPYRROLATE 0.2 MG/ML IJ SOLN
INTRAMUSCULAR | Status: DC | PRN
  Administered 2012-08-25: 0.6 mg via INTRAVENOUS

## 2012-08-25 MED ORDER — FENTANYL CITRATE 0.05 MG/ML IJ SOLN
INTRAMUSCULAR | Status: DC | PRN
  Administered 2012-08-25: 100 ug via INTRAVENOUS
  Administered 2012-08-25: 50 ug via INTRAVENOUS
  Administered 2012-08-25: 100 ug via INTRAVENOUS

## 2012-08-25 MED ORDER — DIPHENHYDRAMINE HCL 50 MG/ML IJ SOLN
12.5000 mg | Freq: Four times a day (QID) | INTRAMUSCULAR | Status: DC | PRN
  Administered 2012-08-25 – 2012-08-27 (×2): 12.5 mg via INTRAVENOUS
  Filled 2012-08-25 (×2): qty 1

## 2012-08-25 MED ORDER — LACTATED RINGERS IV SOLN: INTRAVENOUS | Status: DC

## 2012-08-25 MED ORDER — HEPARIN SODIUM (PORCINE) 5000 UNIT/ML IJ SOLN
5000.0000 [IU] | Freq: Once | INTRAMUSCULAR | Status: AC
  Administered 2012-08-25: 5000 [IU] via SUBCUTANEOUS
  Filled 2012-08-25: qty 1

## 2012-08-25 MED ORDER — KCL IN DEXTROSE-NACL 20-5-0.45 MEQ/L-%-% IV SOLN
INTRAVENOUS | Status: DC
  Administered 2012-08-25 – 2012-08-27 (×4): via INTRAVENOUS
  Filled 2012-08-25 (×7): qty 1000

## 2012-08-25 MED ORDER — MIDAZOLAM HCL 5 MG/5ML IJ SOLN
INTRAMUSCULAR | Status: DC | PRN
  Administered 2012-08-25: 2 mg via INTRAVENOUS

## 2012-08-25 MED ORDER — LACTATED RINGERS IV SOLN
INTRAVENOUS | Status: DC | PRN
  Administered 2012-08-25 (×3): via INTRAVENOUS

## 2012-08-25 MED ORDER — CEFAZOLIN SODIUM-DEXTROSE 2-3 GM-% IV SOLR
2.0000 g | INTRAVENOUS | Status: AC
  Administered 2012-08-25: 2 g via INTRAVENOUS

## 2012-08-25 MED ORDER — EPHEDRINE SULFATE 50 MG/ML IJ SOLN
INTRAMUSCULAR | Status: DC | PRN
  Administered 2012-08-25: 10 mg via INTRAVENOUS

## 2012-08-25 MED ORDER — MORPHINE SULFATE 2 MG/ML IJ SOLN
1.0000 mg | INTRAMUSCULAR | Status: DC | PRN
  Administered 2012-08-25: 1 mg via INTRAVENOUS
  Filled 2012-08-25: qty 1

## 2012-08-25 MED ORDER — HEPARIN SODIUM (PORCINE) 5000 UNIT/ML IJ SOLN
5000.0000 [IU] | Freq: Three times a day (TID) | INTRAMUSCULAR | Status: DC
  Administered 2012-08-25 – 2012-08-27 (×5): 5000 [IU] via SUBCUTANEOUS
  Filled 2012-08-25 (×8): qty 1

## 2012-08-25 MED ORDER — SODIUM CHLORIDE 0.9 % IV SOLN
12.5000 mg | Freq: Once | INTRAVENOUS | Status: AC
  Administered 2012-08-25: 12.5 mg via INTRAVENOUS
  Filled 2012-08-25: qty 0.5

## 2012-08-25 MED ORDER — PROMETHAZINE HCL 25 MG/ML IJ SOLN
12.5000 mg | Freq: Four times a day (QID) | INTRAMUSCULAR | Status: DC | PRN
  Administered 2012-08-25 – 2012-08-26 (×2): 12.5 mg via INTRAVENOUS
  Filled 2012-08-25 (×2): qty 1

## 2012-08-25 MED ORDER — HYDROMORPHONE HCL PF 1 MG/ML IJ SOLN
0.2500 mg | INTRAMUSCULAR | Status: DC | PRN
  Administered 2012-08-25 (×2): 0.5 mg via INTRAVENOUS

## 2012-08-25 MED ORDER — BUPIVACAINE LIPOSOME 1.3 % IJ SUSP
20.0000 mL | Freq: Once | INTRAMUSCULAR | Status: AC
  Administered 2012-08-25: 20 mL
  Filled 2012-08-25: qty 20

## 2012-08-25 MED ORDER — PROPOFOL 10 MG/ML IV BOLUS
INTRAVENOUS | Status: DC | PRN
  Administered 2012-08-25: 200 mg via INTRAVENOUS

## 2012-08-25 MED ORDER — ACETAMINOPHEN 10 MG/ML IV SOLN
INTRAVENOUS | Status: DC | PRN
  Administered 2012-08-25: 1000 mg via INTRAVENOUS

## 2012-08-25 MED ORDER — HYDROMORPHONE HCL PF 1 MG/ML IJ SOLN
INTRAMUSCULAR | Status: DC | PRN
  Administered 2012-08-25 (×2): 1 mg via INTRAVENOUS

## 2012-08-25 MED ORDER — ONDANSETRON HCL 4 MG PO TABS: 4.0000 mg | ORAL_TABLET | Freq: Four times a day (QID) | ORAL | Status: DC | PRN

## 2012-08-25 MED ORDER — LACTATED RINGERS IV SOLN
INTRAVENOUS | Status: DC | PRN
  Administered 2012-08-25: 1000 mL

## 2012-08-25 MED ORDER — SUCCINYLCHOLINE CHLORIDE 20 MG/ML IJ SOLN
INTRAMUSCULAR | Status: DC | PRN
  Administered 2012-08-25: 100 mg via INTRAVENOUS

## 2012-08-25 MED ORDER — HYDROMORPHONE HCL PF 1 MG/ML IJ SOLN
1.0000 mg | INTRAMUSCULAR | Status: DC | PRN
  Administered 2012-08-25 – 2012-08-27 (×11): 1 mg via INTRAVENOUS
  Filled 2012-08-25 (×12): qty 1

## 2012-08-25 MED ORDER — ACETAMINOPHEN 10 MG/ML IV SOLN
1000.0000 mg | Freq: Four times a day (QID) | INTRAVENOUS | Status: AC
  Administered 2012-08-25 – 2012-08-26 (×4): 1000 mg via INTRAVENOUS
  Filled 2012-08-25 (×6): qty 100

## 2012-08-25 MED ORDER — ONDANSETRON HCL 4 MG/2ML IJ SOLN
INTRAMUSCULAR | Status: DC | PRN
  Administered 2012-08-25: 4 mg via INTRAVENOUS

## 2012-08-25 MED ORDER — ROCURONIUM BROMIDE 100 MG/10ML IV SOLN
INTRAVENOUS | Status: DC | PRN
  Administered 2012-08-25: 10 mg via INTRAVENOUS
  Administered 2012-08-25: 35 mg via INTRAVENOUS
  Administered 2012-08-25: 5 mg via INTRAVENOUS
  Administered 2012-08-25 (×2): 10 mg via INTRAVENOUS

## 2012-08-25 MED ORDER — PNEUMOCOCCAL VAC POLYVALENT 25 MCG/0.5ML IJ INJ
0.5000 mL | INJECTION | Freq: Once | INTRAMUSCULAR | Status: DC
  Filled 2012-08-25 (×2): qty 0.5

## 2012-08-25 SURGICAL SUPPLY — 56 items
APPLIER CLIP ROT 10 11.4 M/L (STAPLE)
BENZOIN TINCTURE PRP APPL 2/3 (GAUZE/BANDAGES/DRESSINGS) ×2 IMPLANT
CABLE HIGH FREQUENCY MONO STRZ (ELECTRODE) ×2 IMPLANT
CANISTER SUCTION 2500CC (MISCELLANEOUS) IMPLANT
CLAMP ENDO BABCK 10MM (STAPLE) IMPLANT
CLIP APPLIE ROT 10 11.4 M/L (STAPLE) IMPLANT
CLOTH BEACON ORANGE TIMEOUT ST (SAFETY) ×2 IMPLANT
COVER SURGICAL LIGHT HANDLE (MISCELLANEOUS) ×2 IMPLANT
DECANTER SPIKE VIAL GLASS SM (MISCELLANEOUS) IMPLANT
DERMABOND ADVANCED (GAUZE/BANDAGES/DRESSINGS) ×1
DERMABOND ADVANCED .7 DNX12 (GAUZE/BANDAGES/DRESSINGS) ×1 IMPLANT
DEVICE SUT QUICK LOAD TK 5 (STAPLE) ×2 IMPLANT
DEVICE SUT TI-KNOT TK 5X26 (MISCELLANEOUS) ×10 IMPLANT
DEVICE SUTURE ENDOST 10MM (ENDOMECHANICALS) ×2 IMPLANT
DISSECTOR BLUNT TIP ENDO 5MM (MISCELLANEOUS) ×2 IMPLANT
DRAIN PENROSE 18X1/2 LTX STRL (DRAIN) ×2 IMPLANT
DRAPE LAPAROSCOPIC ABDOMINAL (DRAPES) ×2 IMPLANT
ELECT REM PT RETURN 9FT ADLT (ELECTROSURGICAL) ×2
ELECTRODE REM PT RTRN 9FT ADLT (ELECTROSURGICAL) ×1 IMPLANT
FELT TEFLON 4 X1 (Mesh General) ×2 IMPLANT
FILTER SMOKE EVAC LAPAROSHD (FILTER) IMPLANT
GLOVE BIOGEL M 8.0 STRL (GLOVE) ×2 IMPLANT
GLOVE BIOGEL PI IND STRL 7.0 (GLOVE) IMPLANT
GLOVE BIOGEL PI INDICATOR 7.0 (GLOVE)
GOWN STRL NON-REIN LRG LVL3 (GOWN DISPOSABLE) ×2 IMPLANT
GOWN STRL REIN XL XLG (GOWN DISPOSABLE) ×4 IMPLANT
GRASPER ENDO BABCOCK 10 (MISCELLANEOUS) ×1 IMPLANT
GRASPER ENDO BABCOCK 10MM (MISCELLANEOUS) ×1
HAND ACTIVATED (MISCELLANEOUS) ×2 IMPLANT
KIT BASIN OR (CUSTOM PROCEDURE TRAY) ×2 IMPLANT
NS IRRIG 1000ML POUR BTL (IV SOLUTION) ×2 IMPLANT
PENCIL BUTTON HOLSTER BLD 10FT (ELECTRODE) IMPLANT
SCISSORS LAP 5X35 DISP (ENDOMECHANICALS) ×2 IMPLANT
SET IRRIG TUBING LAPAROSCOPIC (IRRIGATION / IRRIGATOR) ×2 IMPLANT
SLEEVE ADV FIXATION 5X100MM (TROCAR) ×2 IMPLANT
SLEEVE Z-THREAD 5X100MM (TROCAR) IMPLANT
SOLUTION ANTI FOG 6CC (MISCELLANEOUS) ×2 IMPLANT
STAPLER VISISTAT 35W (STAPLE) ×2 IMPLANT
STRIP CLOSURE SKIN 1/2X4 (GAUZE/BANDAGES/DRESSINGS) IMPLANT
SUT SURGIDAC NAB ES-9 0 48 120 (SUTURE) ×12 IMPLANT
SUT VIC AB 4-0 SH 18 (SUTURE) ×2 IMPLANT
SYR 30ML LL (SYRINGE) ×2 IMPLANT
TIP INNERVISION DETACH 40FR (MISCELLANEOUS) IMPLANT
TIP INNERVISION DETACH 50FR (MISCELLANEOUS) IMPLANT
TIP INNERVISION DETACH 56FR (MISCELLANEOUS) ×2 IMPLANT
TIPS INNERVISION DETACH 40FR (MISCELLANEOUS)
TRAY FOLEY CATH 14FRSI W/METER (CATHETERS) ×2 IMPLANT
TRAY LAP CHOLE (CUSTOM PROCEDURE TRAY) ×2 IMPLANT
TROCAR ADV FIXATION 11X100MM (TROCAR) IMPLANT
TROCAR ADV FIXATION 5X100MM (TROCAR) ×2 IMPLANT
TROCAR XCEL BLUNT TIP 100MML (ENDOMECHANICALS) IMPLANT
TROCAR XCEL NON-BLD 11X100MML (ENDOMECHANICALS) IMPLANT
TROCAR Z-THREAD FIOS 11X100 BL (TROCAR) ×2 IMPLANT
TROCAR Z-THREAD FIOS 5X100MM (TROCAR) ×2 IMPLANT
TROCAR Z-THREAD SLEEVE 11X100 (TROCAR) IMPLANT
TUBING FILTER THERMOFLATOR (ELECTROSURGICAL) ×2 IMPLANT

## 2012-08-25 NOTE — Op Note (Signed)
Surgeon: Wenda Low, MD, FACS  Asst:  Freddy Jaksch M.D.  Anes:  Gen.  Procedure: Laparoscopic takedown of type IiI mixed type hiatal hernia with 3 suture posterior closure of hiatus, Nissen fundoplication over #56 lighted bougie  Diagnosis: Long-standing gastroesophageal reflux disease with long segment Barrett's esophagus  Complications: none  EBL:   8 cc  Description of Procedure:  The patient was taken to oh or 1 and given general anesthesia. The abdomen was prepped with PCMX and draped sterilely. A timeout was performed and access to the abdomen was achieved through the left upper quadrant using a 0 5 mm Optiview technique without difficulty. Following insufflation the previous lower midline laparotomy and hernia repair were visualized and some of the omental adhesions were taken down to free up the upper portion of the abdomen. A 5 mm was placed to the right of the midline and subsequently another 5 was placed left laterally for Dr. Maisie Fus. 1112 was placed to the right of the midline and another 5 in the left upper quadrant and a 5 mm port was used for the Nathanson retractor in the upper abdomen to retract the left lateral segment. With the foregut expose there was a visible hernia defect with portion herniated into the chest.  With the harmonic scalpel took down the gastrohepatic ligament and when up and took down the phrenoesophageal attachments and carried this across the left crus. Posteriorly on the right out to down the right crus identified the vagus nerve posteriorly and then began retroesophageal window creation. On the left side up came down about a third of the way on the cardia fundus and then took down the short gastrics. The fundus was taken down carefully whereas it was stuck up in the hilum area and bleeding was controlled with the harmonic (the patient is a Jehovah's Witness (closed and this was taken up to the left crus. Posteriorly we created a window in the Penrose drain  was placed around the distal esophagus and this enabled further dissection of the esophagus in the chest.  I then closed the crura posteriorly with 3 sutures using Endo Stitch tying the most posterior one and using Ty knots on the 2 anterior crural repair sutures. The lighted bougie was passed and it was seen just snug enough in this closure. I brought the cardia round and did a shoeshine maneuver and then performed a Nissen fundoplication using 3 sutures in the Endo Stitch getting bites of both of the stomach on either side of the plication as well as the esophagus and securing these with a timeout. At completion everything looked good. We surveyed the left side again no bleeding was noted. The bougie was withdrawn without difficulty. The port sites were all injected with Exparel. Abdomen was deflated and trochars removed along with the [facility] retractor. Wounds were closed with 4-0 Vicryl and Dermabond.patient was taken recovery room in satisfactory condition.  Matt B. Daphine Deutscher, MD, [facility], [place] [phone]

## 2012-08-25 NOTE — H&P (Signed)
Chief Complaint: GERD and long segment Barrett's esophagus, history of colectomy and ventral hernia repair  History of Present Illness: Nathan Park is an 47 y.o. male referred by Dr Lina Sar who comes with a long-standing history of gastroesophageal reflux which he didn't realize was not normal until he met his wife. He had lived on [place] to avoid pain. Proton pump inhibitors have made him symptomatically much better although he does sleep with the head of his bed elevated. I reviewed his upper GI series which showed a hiatal hernia with reflux. He has a long segment Barrett's esophagus followed by Dr. Juanda Chance.  I went over laparoscopic Nissen fundoplication with him and his wife in some detail. I advised him of the risk benefits not limited to failure but also to include organ injury and bleeding. 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  I gave him our pamphlet on Nissen fundoplication and explaining the operation and some detail. He was to proceed with this. We'll schedule at his convenience it was a long period  Within the last 2 years he had undergo a colectomy for a colovesical fistula by Dr. Gerrit Friends and he had a subsequent ventral hernia repair with mesh by him as well.  Past Medical History   Diagnosis  Date   .  Diverticulosis    .  Umbilical hernia    .  GERD (gastroesophageal reflux disease)    .  Undiagnosed cardiac murmurs    .  Nephrolithiasis    .  Sleep apnea    .  Barrett esophagus  01/31/10     at 32 cm   .  Hiatal hernia    .  Hemorrhoids    .  Rectal pain    .  Diabetes mellitus    .  Nasal congestion    .  Hiatal hernia     Past Surgical History   Procedure  Date   .  Sigmoid colectomy  04/01/10   .  Take down of colovesical fistula  04/01/10   .  Takedown of enterovesical fistula  04/01/10   .  Repair umblical hernia repair  04/01/10   .  Orif distal radius fracture  2009   .  Carpal tunnel release    .  Wrist  surgery      left    Current Outpatient Prescriptions   Medication  Sig  Dispense  Refill   .  azelastine (ASTELIN) 137 MCG/SPRAY nasal spray  Place 1 spray into the nose 2 (two) times daily. Use in each nostril as directed  30 mL  12   .  famotidine (PEPCID) 40 MG tablet  Take 1 tablet (40 mg total) by mouth at bedtime as needed for heartburn.  30 tablet  6   .  fluticasone (FLONASE) 50 MCG/ACT nasal spray  Place 2 sprays into the nose daily.  16 g  11   .  omeprazole (PRILOSEC) 40 MG capsule  TAKE 1 CAPSULE DAILY  90 capsule  2   .  pramoxine-hydrocortisone cream  Apply to affected area 2-3 times daily (perianal skin)  30 g  1   .  Saxagliptin HCl (ONGLYZA PO)  Take by mouth as directed.     [name] (PREPARATION H EX)  Apply topically as directed.     Marland Kitchen  DISCONTD: Azelastine-Fluticasone (DYMISTA) 137-50 MCG/ACT SUSP  Place 1 Act into the nose 2 (two) times  daily.  3 Bottle  3   Oxycodone hcl er  Family History   Problem  Relation  Age of Onset   .  Diabetes  Father    .  Colon cancer  Neg Hx    .  Cancer  Neg Hx    .  Heart disease  Neg Hx    .  Hypertension  Neg Hx    .  Kidney disease  Neg Hx    Social History: reports that he has never smoked. He has never used smokeless tobacco. He reports that he drinks about 3 ounces of alcohol per week. He reports that he does not use illicit drugs.  REVIEW OF SYSTEMS - PERTINENT POSITIVES ONLY:  + Jehovah's witness  Physical Exam:  Blood pressure 114/78, pulse 70, temperature 97.6 F (36.4 C), temperature source Temporal, resp. rate 18, height 5\' 6"  (1.676 m), weight 192 lb 4 oz (87.204 kg).  Body mass index is 31.03 kg/(m^2).  Gen: WDWN WM NAD  Neurological: Alert and oriented to person, place, and time. Motor and sensory function is grossly intact  Head: Normocephalic and atraumatic.  Eyes: Conjunctivae are normal. Pupils are equal, round, and reactive to light. No scleral icterus.  Neck: Normal range of motion. Neck supple. No  tracheal deviation or thyromegaly present.  Cardiovascular: SR without murmurs or gallops. No carotid bruits  Respiratory: Effort normal. No respiratory distress. No chest wall tenderness. Breath sounds normal. No wheezes, rales or rhonchi.  Abdomen: Lower midline incision with no recurrent hernia.  GU:  Musculoskeletal: Normal range of motion. Extremities are nontender. No cyanosis, edema or clubbing noted Lymphadenopathy: No cervical, preauricular, postauricular or axillary adenopathy is present Skin: Skin is warm and dry. No rash noted. No diaphoresis. No erythema. No pallor. Pscyh: Normal mood and affect. Behavior is normal. Judgment and thought content normal.  LABORATORY RESULTS:  No results found for this or any previous visit (from the past 48 hour(s)).  RADIOLOGY RESULTS:  No results found.  Problem List:  Patient Active Problem List   Diagnosis   .  Type II or unspecified type diabetes mellitus without mention of complication, uncontrolled   .  Hemorrhoids, internal   .  Allergic rhinitis, cause unspecified   .  Pure hyperglyceridemia   .  Diverticulosis   .  Barrett's esophagus   .  Acute sinusitis, unspecified   Assessment & Plan:  Significant GERD and hiatus hernia with long segment Barrett's esophagus in a Jehovah's witness who refuses transfusion.  Lap Nissen fundoplication  I had answered all the questions that he and his wife had when I saw them back in July. I saw him today he stated he had raise the money for the down payment surgery and was ready to get scheduled. He felt like he was very well prepped and had no further questions about the Nissen fundoplication. I reiterated the possibility of his having to be done open since Dr. Gerrit Friends put the mesh in his abdomen.  Matt B. Daphine Deutscher, MD, [facility]  [phone] beeper  [phone]

## 2012-08-25 NOTE — Transfer of Care (Signed)
Immediate Anesthesia Transfer of Care Note  Patient: Nathan Park  Procedure(s) Performed: Procedure(s) (LRB) with comments: LAPAROSCOPIC NISSEN FUNDOPLICATION (N/A)  Patient Location: PACU  Anesthesia Type:General  Level of Consciousness: awake, alert  and oriented  Airway & Oxygen Therapy: Patient Spontanous Breathing and Patient connected to face mask oxygen  Post-op Assessment: Report given to PACU RN and Post -op Vital signs reviewed and stable  Post vital signs: Reviewed and stable  Complications: No apparent anesthesia complications

## 2012-08-25 NOTE — Interval H&P Note (Signed)
History and Physical Interval Note:  08/25/2012 8:43 AM  Nathan Park  has presented today for surgery, with the diagnosis of GERD and Barrett's  The various methods of treatment have been discussed with the patient and family. After consideration of risks, benefits and other options for treatment, the patient has consented to  Procedure(s) (LRB) with comments: LAPAROSCOPIC NISSEN FUNDOPLICATION (N/A) as a surgical intervention .  The patient's history has been reviewed, patient examined, no change in status, stable for surgery.  I have reviewed the patient's chart and labs.  Questions were answered to the patient's satisfaction.     Yardley Lekas B

## 2012-08-25 NOTE — Anesthesia Postprocedure Evaluation (Signed)
  Anesthesia Post-op Note  Patient: Nathan Park  Procedure(s) Performed: Procedure(s) (LRB): LAPAROSCOPIC NISSEN FUNDOPLICATION (N/A)  Patient Location: PACU  Anesthesia Type: General  Level of Consciousness: awake and alert   Airway and Oxygen Therapy: Patient Spontanous Breathing  Post-op Pain: mild  Post-op Assessment: Post-op Vital signs reviewed, Patient's Cardiovascular Status Stable, Respiratory Function Stable, Patent Airway and No signs of Nausea or vomiting  Last Vitals:  Filed Vitals:   08/25/12 1130  BP: 136/69  Pulse: 74  Temp:   Resp: 10    Post-op Vital Signs: stable   Complications: No apparent anesthesia complications

## 2012-08-26 ENCOUNTER — Inpatient Hospital Stay (HOSPITAL_COMMUNITY): Payer: Federal, State, Local not specified - PPO

## 2012-08-26 ENCOUNTER — Encounter (HOSPITAL_COMMUNITY): Payer: Self-pay | Admitting: Surgery

## 2012-08-26 LAB — CBC
HCT: 39.9 % (ref 39.0–52.0)
Hemoglobin: 14.1 g/dL (ref 13.0–17.0)
MCH: 30.3 pg (ref 26.0–34.0)
RBC: 4.66 MIL/uL (ref 4.22–5.81)

## 2012-08-26 LAB — GLUCOSE, CAPILLARY
Glucose-Capillary: 147 mg/dL — ABNORMAL HIGH (ref 70–99)
Glucose-Capillary: 157 mg/dL — ABNORMAL HIGH (ref 70–99)
Glucose-Capillary: 171 mg/dL — ABNORMAL HIGH (ref 70–99)

## 2012-08-26 LAB — BASIC METABOLIC PANEL
BUN: 12 mg/dL (ref 6–23)
CO2: 28 mEq/L (ref 19–32)
Glucose, Bld: 176 mg/dL — ABNORMAL HIGH (ref 70–99)
Potassium: 3.4 mEq/L — ABNORMAL LOW (ref 3.5–5.1)
Sodium: 133 mEq/L — ABNORMAL LOW (ref 135–145)

## 2012-08-26 MED ORDER — INSULIN GLARGINE 100 UNIT/ML ~~LOC~~ SOLN
5.0000 [IU] | Freq: Every day | SUBCUTANEOUS | Status: DC
  Administered 2012-08-26: 5 [IU] via SUBCUTANEOUS

## 2012-08-26 MED ORDER — INSULIN ASPART 100 UNIT/ML ~~LOC~~ SOLN
0.0000 [IU] | SUBCUTANEOUS | Status: DC
  Administered 2012-08-26: 2 [IU] via SUBCUTANEOUS
  Administered 2012-08-27: 3 [IU] via SUBCUTANEOUS

## 2012-08-26 MED ORDER — ACETAMINOPHEN 500 MG PO TABS: 500.0000 mg | ORAL_TABLET | Freq: Four times a day (QID) | ORAL | Status: DC | PRN

## 2012-08-26 MED ORDER — ACETAMINOPHEN 160 MG/5ML PO SOLN
500.0000 mg | Freq: Four times a day (QID) | ORAL | Status: DC | PRN
  Administered 2012-08-26: 500 mg via ORAL
  Filled 2012-08-26: qty 20.3

## 2012-08-26 MED ORDER — IOHEXOL 300 MG/ML  SOLN
150.0000 mL | Freq: Once | INTRAMUSCULAR | Status: AC | PRN
  Administered 2012-08-26: 25 mL via ORAL

## 2012-08-26 NOTE — Progress Notes (Signed)
Patient ID: FELIPE PALUCH, male   DOB: 23-Jan-1966, 47 y.o.   MRN: [id number] [facility] Surgery Progress Note:   1 Day Post-Op  Subjective: Mental status is clear Objective: Vital signs in last 24 hours: Temp:  [97.6 F (36.4 C)-98.3 F (36.8 C)] 97.9 F (36.6 C) (01/09 0600) Pulse Rate:  [69-107] 78  (01/09 0600) Resp:  [9-16] 16  (01/09 0600) BP: (105-138)/(66-89) 105/70 mmHg (01/09 0600) SpO2:  [95 %-100 %] 95 % (01/09 0600) Weight:  [192 lb (87.091 kg)] 192 lb (87.091 kg) (01/08 1229)  Intake/Output from previous day: 01/08 0701 - 01/09 0700 In: 4548.3 [I.V.:4348.3; IV Piggyback:200] Out: 2175 [Urine:2075; Blood:100] Intake/Output this shift:    Physical Exam: Work of breathing is normal.  Slept well.  UGI pending.    Lab Results:  Results for orders placed during the hospital encounter of 08/25/12 (from the past 48 hour(s))  GLUCOSE, CAPILLARY     Status: Abnormal   Collection Time   08/25/12 11:32 AM      Component Value Range Comment   Glucose-Capillary 185 (*) 70 - 99 mg/dL   CBC     Status: Abnormal   Collection Time   08/25/12  1:35 PM      Component Value Range Comment   WBC 15.6 (*) 4.0 - 10.5 K/uL    RBC 4.86  4.22 - 5.81 MIL/uL    Hemoglobin 14.6  13.0 - 17.0 g/dL    HCT 91.4  78.2 - 95.6 %    MCV 84.2  78.0 - 100.0 fL    MCH 30.0  26.0 - 34.0 pg    MCHC 35.7  30.0 - 36.0 g/dL    RDW 21.3  08.6 - 57.8 %    Platelets 181  150 - 400 K/uL   CREATININE, SERUM     Status: Normal   Collection Time   08/25/12  1:35 PM      Component Value Range Comment   Creatinine, Ser 0.88  0.50 - 1.35 mg/dL    GFR calc non Af Amer >90  >90 mL/min    GFR calc Af Amer >90  >90 mL/min   CBC     Status: Normal   Collection Time   08/26/12  4:05 AM      Component Value Range Comment   WBC 9.6  4.0 - 10.5 K/uL    RBC 4.66  4.22 - 5.81 MIL/uL    Hemoglobin 14.1  13.0 - 17.0 g/dL    HCT 46.9  62.9 - 52.8 %    MCV 85.6  78.0 - 100.0 fL    MCH 30.3  26.0 - 34.0 pg    MCHC 35.3   30.0 - 36.0 g/dL    RDW 41.3  24.4 - 01.0 %    Platelets 165  150 - 400 K/uL   BASIC METABOLIC PANEL     Status: Abnormal   Collection Time   08/26/12  4:05 AM      Component Value Range Comment   Sodium 133 (*) 135 - 145 mEq/L    Potassium 3.4 (*) 3.5 - 5.1 mEq/L    Chloride 97  96 - 112 mEq/L    CO2 28  19 - 32 mEq/L    Glucose, Bld 176 (*) 70 - 99 mg/dL    BUN 12  6 - 23 mg/dL    Creatinine, Ser 2.72  0.50 - 1.35 mg/dL    Calcium 8.0 (*) 8.4 - 10.5 mg/dL  GFR calc non Af Amer >90  >90 mL/min    GFR calc Af Amer >90  >90 mL/min     Radiology/Results: No results found.  Anti-infectives: Anti-infectives     Start     Dose/Rate Route Frequency Ordered Stop   08/25/12 0617   ceFAZolin (ANCEF) IVPB 2 g/50 mL premix        2 g 100 mL/hr over 30 Minutes Intravenous On call to O.R. 08/25/12 0617 08/25/12 0854          Assessment/Plan: Problem List: Patient Active Problem List  Diagnosis  . Type II or unspecified type diabetes mellitus without mention of complication, uncontrolled  . Hemorrhoids, internal  . Allergic rhinitis, cause unspecified  . Pure hyperglyceridemia  . Diverticulosis  . Barrett's esophagus  . Acute sinusitis, unspecified  . Refusal of blood transfusions as patient is Jehovah's Witness  . GERD (gastroesophageal reflux disease)    Awaiting UGI report.   1 Day Post-Op    LOS: 1 day   Matt B. Daphine Deutscher, MD, [facility] [phone] beeper [phone]  08/26/2012 9:19 AM Swallow OK.  Start clears po

## 2012-08-26 NOTE — Care Management Note (Signed)
    Page 1 of 1   08/26/2012     11:07:26 AM   CARE MANAGEMENT NOTE 08/26/2012  Patient:  Nathan Park, Nathan Park   Account Number:  [id number]  Date Initiated:  08/26/2012  Documentation initiated by:  Lorenda Ishihara  Subjective/Objective Assessment:   47 yo male admitted s/p nissen fundoplication. PTA lived at home with home spouse.     Action/Plan:   Home when stable   Anticipated DC Date:  08/26/2012   Anticipated DC Plan:  HOME/SELF CARE      DC Planning Services  CM consult      Choice offered to / List presented to:             Status of service:  Completed, signed off Medicare Important Message given?   (If response is "NO", the following Medicare IM given date fields will be blank) Date Medicare IM given:   Date Additional Medicare IM given:    Discharge Disposition:  HOME/SELF CARE  Per UR Regulation:  Reviewed for med. necessity/level of care/duration of stay  If discussed at Long Length of Stay Meetings, dates discussed:    Comments:

## 2012-08-27 ENCOUNTER — Telehealth (INDEPENDENT_AMBULATORY_CARE_PROVIDER_SITE_OTHER): Payer: Self-pay | Admitting: General Surgery

## 2012-08-27 DIAGNOSIS — Z9889 Other specified postprocedural states: Secondary | ICD-10-CM | POA: Diagnosis not present

## 2012-08-27 LAB — CBC
Hemoglobin: 13.7 g/dL (ref 13.0–17.0)
Platelets: 173 10*3/uL (ref 150–400)
RBC: 4.57 MIL/uL (ref 4.22–5.81)
WBC: 7.4 10*3/uL (ref 4.0–10.5)

## 2012-08-27 LAB — BASIC METABOLIC PANEL
CO2: 28 mEq/L (ref 19–32)
Chloride: 101 mEq/L (ref 96–112)
Glucose, Bld: 117 mg/dL — ABNORMAL HIGH (ref 70–99)
Potassium: 3.9 mEq/L (ref 3.5–5.1)
Sodium: 136 mEq/L (ref 135–145)

## 2012-08-27 MED ORDER — PROMETHAZINE HCL 12.5 MG PO TABS: 12.5000 mg | ORAL_TABLET | Freq: Four times a day (QID) | ORAL | Status: DC | PRN

## 2012-08-27 MED ORDER — PROMETHAZINE HCL 25 MG PO TABS: 12.5000 mg | ORAL_TABLET | Freq: Four times a day (QID) | ORAL | Status: DC | PRN

## 2012-08-27 MED ORDER — HYDROCODONE-ACETAMINOPHEN 7.5-500 MG/15ML PO SOLN: 10.0000 mL | ORAL | Status: DC | PRN

## 2012-08-27 NOTE — Discharge Summary (Signed)
Physician Discharge Summary  Patient ID: Nathan Park MRN: [id number] DOB/AGE: 1965-08-27 47 y.o.  Admit date: 08/25/2012 Discharge date: 08/27/2012  Admission Diagnoses:  GERD, hiatus hernia, and Barrett's esophagus  Discharge Diagnoses:  Same, post repair of hiatus hernia and Nissen fundoplication  Active Problems:  Lap Nissen fundoplication and repair hiatus hernia Jan 2014   Surgery:  Lap Nissen and repair of hiatus hernia  Discharged Condition: improved  Hospital Course:   Had surgery.  UGI on PD looked good.  Ready for discharge on PD 2.    Consults: none  Significant Diagnostic Studies: UGI    Discharge Exam: Blood pressure 110/74, pulse 73, temperature 98.6 F (37 C), temperature source Oral, resp. rate 18, height 5\' 6"  (1.676 m), weight 192 lb (87.091 kg), SpO2 97.00%. Up and about.  Not much pain.  Taking POs well.   Disposition:   Discharge Orders    Future Appointments: Provider: Department: Dept Phone: Center:   09/10/2012 9:00 AM Valarie Merino, MD [facility], [place] [phone] None     Future Orders Please Complete By Expires   Diet Carb Modified      Comments:   Liquids for 1 week and pureed foods for the following 3 weeks   Increase activity slowly      Discharge instructions      Comments:   Watch your blood sugar as your diet has changed. Contact your primary care for advice on diabetes medication adjustments   Driving Restrictions      Comments:   You may drive when you feel OK and are not taking narcotics for pain   No wound care          Medication List     As of 08/27/2012  7:59 AM    STOP taking these medications         famotidine 40 MG tablet   Commonly known as: PEPCID      GOODY HEADACHE PO      omeprazole 40 MG capsule   Commonly known as: PRILOSEC      TAKE these medications         acetaminophen 500 MG tablet   Commonly known as: TYLENOL   Take 500-1,000 mg by mouth every 6 (six) hours as needed. For pain        acidophilus Caps   Take 1 capsule by mouth daily.      ALPRAZolam 1 MG tablet   Commonly known as: XANAX   Take 1 mg by mouth as needed.      azelastine 137 MCG/SPRAY nasal spray   Commonly known as: ASTELIN   Place 1 spray into the nose 2 (two) times daily. Use in each nostril as directed      fluticasone 50 MCG/ACT nasal spray   Commonly known as: FLONASE   Place 2 sprays into the nose daily.      HYDROcodone-acetaminophen 7.5-500 MG/15ML solution   Commonly known as: LORTAB   Take 10 mLs by mouth every 4 (four) hours as needed.      multivitamin with minerals Tabs   Take 1 tablet by mouth daily.      ONGLYZA 5 MG Tabs tablet   Generic drug: saxagliptin HCl   Take 5 mg by mouth daily.      pramoxine-hydrocortisone cream   Apply to affected area 2-3 times daily (perianal skin)      PREPARATION H EX   Apply topically as directed.  promethazine 12.5 MG tablet   Commonly known as: PHENERGAN   Take 1 tablet (12.5 mg total) by mouth every 6 (six) hours as needed.      pseudoephedrine 120 MG 12 hr tablet   Commonly known as: SUDAFED   Take 120 mg by mouth every 12 (twelve) hours.           Follow-up Information    Follow up with Jamayah Myszka B, MD. In 3 weeks.   Contact information:   [address] [place] [address] [phone]          Signed: Valarie Merino 08/27/2012, 7:59 AM

## 2012-08-27 NOTE — Telephone Encounter (Signed)
Received call from [place] at [facility] on [address], she advised that Lortab 7.5-500 has been discontinued and needed Dr. Ermalene Searing approval to issue the prescription as 7.5-325. I paged Dr. Daphine Deutscher, to advise and he called back with approval. I called back Angelique Blonder at [facility] on [phone] to advise Dr. Daphine Deutscher approved the change.

## 2012-09-10 ENCOUNTER — Ambulatory Visit (INDEPENDENT_AMBULATORY_CARE_PROVIDER_SITE_OTHER): Payer: Federal, State, Local not specified - PPO | Admitting: Surgery

## 2012-09-10 ENCOUNTER — Encounter (INDEPENDENT_AMBULATORY_CARE_PROVIDER_SITE_OTHER): Payer: Self-pay | Admitting: Surgery

## 2012-09-10 ENCOUNTER — Encounter (INDEPENDENT_AMBULATORY_CARE_PROVIDER_SITE_OTHER): Payer: Self-pay

## 2012-09-10 VITALS — BP 114/62 | HR 76 | Resp 16 | Ht 66.0 in | Wt 182.8 lb

## 2012-09-10 DIAGNOSIS — Z09 Encounter for follow-up examination after completed treatment for conditions other than malignant neoplasm: Secondary | ICD-10-CM

## 2012-09-10 DIAGNOSIS — Z9889 Other specified postprocedural states: Secondary | ICD-10-CM

## 2012-09-10 NOTE — Progress Notes (Signed)
Nathan Park 47 y.o.  Body mass index is 29.50 kg/(m^2).  Patient Active Problem List  Diagnosis  . Type II or unspecified type diabetes mellitus without mention of complication, uncontrolled  . Hemorrhoids, internal  . Allergic rhinitis, cause unspecified  . Pure hyperglyceridemia  . Diverticulosis  . Barrett's esophagus  . Acute sinusitis, unspecified  . Refusal of blood transfusions as patient is Jehovah's Witness  . GERD (gastroesophageal reflux disease)  . Lap Nissen fundoplication and repair hiatus hernia Jan 2014    Allergies  Allergen Reactions  . Morphine And Related     "Does not work for me"  . Oxycodone Hcl Er Itching and Nausea Only    Past Surgical History  Procedure Date  . Sigmoid colectomy 04/01/10  . Take down of colovesical fistula 04/01/10  . Takedown of enterovesical fistula 04/01/10  . Repair umblical hernia repair 04/01/10  . Orif distal radius fracture 2009  . Carpal tunnel release   . Wrist surgery     left  . Laparoscopic nissen fundoplication 08/25/2012    Procedure: LAPAROSCOPIC NISSEN FUNDOPLICATION;  Surgeon: Valarie Merino, MD;  Location: [facility];  Service: General;  Laterality: N/A;   Sanda Linger, MD No diagnosis found.  Doing very well.  I signed his FMLA papers (he filled them out since that is what he does for the [employer]).  He will return to work on Monday.  Discussed dietary advancement.   Return 6 weeks.  Matt B. Daphine Deutscher, MD, [facility] [phone] beeper [phone]  09/10/2012 9:29 AM

## 2012-09-10 NOTE — Patient Instructions (Signed)
May return to work this next Monday with 15 lbs weight restriction.

## 2012-10-21 ENCOUNTER — Ambulatory Visit (INDEPENDENT_AMBULATORY_CARE_PROVIDER_SITE_OTHER): Payer: Federal, State, Local not specified - PPO | Admitting: Surgery

## 2012-10-21 ENCOUNTER — Encounter (INDEPENDENT_AMBULATORY_CARE_PROVIDER_SITE_OTHER): Payer: Self-pay | Admitting: Surgery

## 2012-10-21 VITALS — BP 108/84 | HR 68 | Temp 97.6°F | Resp 16 | Ht 66.0 in | Wt 179.0 lb

## 2012-10-21 DIAGNOSIS — Z09 Encounter for follow-up examination after completed treatment for conditions other than malignant neoplasm: Secondary | ICD-10-CM

## 2012-10-21 DIAGNOSIS — Z9889 Other specified postprocedural states: Secondary | ICD-10-CM

## 2012-10-21 NOTE — Progress Notes (Signed)
Nathan Park 47 y.o.  Body mass index is 28.91 kg/(m^2).  Patient Active Problem List  Diagnosis  . Type II or unspecified type diabetes mellitus without mention of complication, uncontrolled  . Hemorrhoids, internal  . Allergic rhinitis, cause unspecified  . Pure hyperglyceridemia  . Diverticulosis  . Barrett's esophagus  . Acute sinusitis, unspecified  . Refusal of blood transfusions as patient is Jehovah's Witness  . GERD (gastroesophageal reflux disease)  . Lap Nissen fundoplication and repair hiatus hernia Jan 2014    Allergies  Allergen Reactions  . Morphine And Related     "Does not work for me"  . Oxycodone Hcl Er Itching and Nausea Only    Past Surgical History  Procedure Laterality Date  . Sigmoid colectomy  04/01/10  . Take down of colovesical fistula  04/01/10  . Takedown of enterovesical fistula  04/01/10  . Repair umblical hernia repair  04/01/10  . Orif distal radius fracture  2009  . Carpal tunnel release    . Wrist surgery      left  . Laparoscopic nissen fundoplication  08/25/2012    Procedure: LAPAROSCOPIC NISSEN FUNDOPLICATION;  Surgeon: Valarie Merino, MD;  Location: [facility];  Service: General;  Laterality: N/A;   Sanda Linger, MD No diagnosis found.  Had Lap Nissen on 08/25/12.  Advancing diet and not having any problems.  Looks good.  Doing well.  I will see again prn   Matt B. Daphine Deutscher, MD, [facility] [phone] beeper [phone]  10/21/2012 12:20 PM

## 2012-10-21 NOTE — Patient Instructions (Addendum)
Thanks for your patience.  If you need further assistance after leaving the office, please call our office and speak with a [facility] nurse.  [phone].

## 2013-03-02 ENCOUNTER — Ambulatory Visit (INDEPENDENT_AMBULATORY_CARE_PROVIDER_SITE_OTHER): Payer: 59 | Admitting: Internal Medicine

## 2013-03-02 ENCOUNTER — Encounter: Payer: Self-pay | Admitting: Internal Medicine

## 2013-03-02 ENCOUNTER — Other Ambulatory Visit (INDEPENDENT_AMBULATORY_CARE_PROVIDER_SITE_OTHER): Payer: 59

## 2013-03-02 VITALS — BP 136/86 | HR 69 | Temp 98.0°F | Resp 16 | Wt 180.0 lb

## 2013-03-02 DIAGNOSIS — IMO0001 Reserved for inherently not codable concepts without codable children: Secondary | ICD-10-CM

## 2013-03-02 DIAGNOSIS — E785 Hyperlipidemia, unspecified: Secondary | ICD-10-CM

## 2013-03-02 DIAGNOSIS — H109 Unspecified conjunctivitis: Secondary | ICD-10-CM

## 2013-03-02 LAB — CBC WITH DIFFERENTIAL/PLATELET
Basophils Relative: 0.6 % (ref 0.0–3.0)
Eosinophils Relative: 5.7 % — ABNORMAL HIGH (ref 0.0–5.0)
HCT: 45.8 % (ref 39.0–52.0)
Lymphs Abs: 1.7 10*3/uL (ref 0.7–4.0)
MCV: 90.3 fl (ref 78.0–100.0)
Monocytes Absolute: 0.6 10*3/uL (ref 0.1–1.0)
Monocytes Relative: 9 % (ref 3.0–12.0)
Neutrophils Relative %: 60.1 % (ref 43.0–77.0)
RBC: 5.07 Mil/uL (ref 4.22–5.81)
WBC: 6.9 10*3/uL (ref 4.5–10.5)

## 2013-03-02 LAB — URINALYSIS, ROUTINE W REFLEX MICROSCOPIC
Bilirubin Urine: NEGATIVE
Hgb urine dipstick: NEGATIVE
Nitrite: NEGATIVE
Total Protein, Urine: NEGATIVE
Urine Glucose: NEGATIVE
WBC, UA: NONE SEEN (ref 0–?)
pH: 6.5 (ref 5.0–8.0)

## 2013-03-02 LAB — MICROALBUMIN / CREATININE URINE RATIO
Creatinine,U: 21.3 mg/dL
Microalb, Ur: 0.2 mg/dL (ref 0.0–1.9)

## 2013-03-02 LAB — LIPID PANEL
Cholesterol: 131 mg/dL (ref 0–200)
HDL: 42.2 mg/dL (ref >39.00)
Triglycerides: 105 mg/dL (ref 0.0–149.0)
VLDL: 21 mg/dL (ref 0.0–40.0)

## 2013-03-02 LAB — COMPREHENSIVE METABOLIC PANEL
Alkaline Phosphatase: 47 U/L (ref 39–117)
BUN: 14 mg/dL (ref 6–23)
Glucose, Bld: 103 mg/dL — ABNORMAL HIGH (ref 70–99)
Total Bilirubin: 0.5 mg/dL (ref 0.3–1.2)

## 2013-03-02 LAB — HEMOGLOBIN A1C: Hgb A1c MFr Bld: 6.5 % (ref 4.6–6.5)

## 2013-03-02 NOTE — Progress Notes (Addendum)
Subjective:    Patient ID: Nathan Park, male    DOB: [birth year], 47 y.o.   MRN: [id number]  Conjunctivitis  The current episode started 3 to 5 days ago. The problem occurs rarely. The problem has been rapidly improving. The problem is mild. Nothing relieves the symptoms. Nothing aggravates the symptoms. Associated symptoms include eye itching and eye redness. Pertinent negatives include no fever, no decreased vision, no double vision, no photophobia, no abdominal pain, no constipation, no diarrhea, no nausea, no vomiting, no congestion, no ear discharge, no ear pain, no headaches, no hearing loss, no mouth sores, no rhinorrhea, no sore throat, no stridor, no swollen glands, no cough, no wheezing, no eye discharge and no eye pain.      Review of Systems  Constitutional: Negative.  Negative for fever, chills, diaphoresis, activity change, appetite change, fatigue and unexpected weight change.  HENT: Negative.  Negative for hearing loss, ear pain, congestion, sore throat, rhinorrhea, mouth sores and ear discharge.   Eyes: Positive for redness and itching. Negative for double vision, photophobia, pain and discharge.  Respiratory: Negative.  Negative for cough, chest tightness, shortness of breath, wheezing and stridor.   Cardiovascular: Negative.  Negative for chest pain, palpitations and leg swelling.  Gastrointestinal: Negative.  Negative for nausea, vomiting, abdominal pain, diarrhea, constipation and blood in stool.  Endocrine: Negative.  Negative for polydipsia, polyphagia and polyuria.  Genitourinary: Negative.  Negative for urgency and frequency.  Musculoskeletal: Negative.   Skin: Negative.   Allergic/Immunologic: Negative.   Neurological: Negative.  Negative for dizziness, numbness and headaches.  Hematological: Negative.  Negative for adenopathy. Does not bruise/bleed easily.  Psychiatric/Behavioral: Negative.        Objective:   Physical Exam  Vitals reviewed. Constitutional:  He is oriented to person, place, and time. He appears well-developed and well-nourished. No distress.  HENT:  Head: Normocephalic and atraumatic.  Mouth/Throat: Oropharynx is clear and moist. No oropharyngeal exudate.  Eyes: Conjunctivae, EOM and lids are normal. Pupils are equal, round, and reactive to light. No foreign bodies found. Right eye exhibits no chemosis, no discharge, no exudate and no hordeolum. No foreign body present in the right eye. Left eye exhibits no chemosis, no discharge, no exudate and no hordeolum. No foreign body present in the left eye. Right conjunctiva is not injected. Right conjunctiva has no hemorrhage. Left conjunctiva is not injected. Left conjunctiva has no hemorrhage. No scleral icterus. Right eye exhibits normal extraocular motion and no nystagmus. Left eye exhibits normal extraocular motion and no nystagmus. Pupils are equal.  Neck: Normal range of motion. Neck supple. No JVD present. No tracheal deviation present. No thyromegaly present.  Cardiovascular: Normal rate, regular rhythm, normal heart sounds and intact distal pulses.  Exam reveals no gallop and no friction rub.   No murmur heard. Pulmonary/Chest: Effort normal and breath sounds normal. No stridor. No respiratory distress. He has no wheezes. He has no rales. He exhibits no tenderness.  Abdominal: Soft. Bowel sounds are normal. He exhibits no distension and no mass. There is no tenderness. There is no rebound and no guarding.  Musculoskeletal: Normal range of motion. He exhibits no edema and no tenderness.  Lymphadenopathy:    He has no cervical adenopathy.  Neurological: He is oriented to person, place, and time.  Skin: Skin is warm and dry. No rash noted. He is not diaphoretic. No erythema. No pallor.  Psychiatric: He has a normal mood and affect. His behavior is normal. Judgment and thought content normal.  Lab Results  Component Value Date   WBC 7.4 08/27/2012   HGB 13.7 08/27/2012   HCT 40.2  08/27/2012   PLT 173 08/27/2012   GLUCOSE 117* 08/27/2012   CHOL 124 10/16/2011   TRIG 75.0 10/16/2011   HDL 37.00* 10/16/2011   LDLCALC 72 10/16/2011   ALT 28 10/16/2011   AST 20 10/16/2011   NA 136 08/27/2012   K 3.9 08/27/2012   CL 101 08/27/2012   CREATININE 0.90 08/27/2012   BUN 8 08/27/2012   CO2 28 08/27/2012   TSH 0.75 05/22/2011   INR 1.08 03/25/2010   HGBA1C 6.2 10/16/2011   MICROALBUR 1.1 05/22/2011       Assessment & Plan:

## 2013-03-02 NOTE — Assessment & Plan Note (Signed)
Exam is normal today This has resolved

## 2013-03-02 NOTE — Assessment & Plan Note (Signed)
I will recheck his A1C and will treat if needed 

## 2013-03-02 NOTE — Assessment & Plan Note (Signed)
FL[facility] CMP TSH today 

## 2013-03-02 NOTE — Patient Instructions (Signed)

## 2013-03-15 ENCOUNTER — Telehealth: Payer: Self-pay | Admitting: *Deleted

## 2013-03-15 NOTE — Telephone Encounter (Signed)
Pt called requesting refill on Onglysa be sent to [facility].  Per last visit note medication was discontinued due to non complaince.  Please advise

## 2013-03-15 NOTE — Telephone Encounter (Signed)
Based in his last a1c he does not need this

## 2013-03-16 ENCOUNTER — Telehealth: Payer: Self-pay | Admitting: *Deleted

## 2013-03-16 NOTE — Telephone Encounter (Signed)
Spoke with pt advised him of MDs notes.

## 2013-03-16 NOTE — Telephone Encounter (Signed)
Left msg for pt to return call.

## 2013-03-28 ENCOUNTER — Encounter (INDEPENDENT_AMBULATORY_CARE_PROVIDER_SITE_OTHER): Payer: 59 | Admitting: Ophthalmology

## 2013-03-28 DIAGNOSIS — H2510 Age-related nuclear cataract, unspecified eye: Secondary | ICD-10-CM

## 2013-03-28 DIAGNOSIS — E1139 Type 2 diabetes mellitus with other diabetic ophthalmic complication: Secondary | ICD-10-CM

## 2013-03-28 DIAGNOSIS — H43819 Vitreous degeneration, unspecified eye: Secondary | ICD-10-CM

## 2013-03-28 DIAGNOSIS — E11319 Type 2 diabetes mellitus with unspecified diabetic retinopathy without macular edema: Secondary | ICD-10-CM

## 2013-03-28 LAB — HM DIABETES EYE EXAM

## 2013-05-04 ENCOUNTER — Ambulatory Visit (INDEPENDENT_AMBULATORY_CARE_PROVIDER_SITE_OTHER): Payer: 59 | Admitting: Internal Medicine

## 2013-05-04 ENCOUNTER — Encounter: Payer: Self-pay | Admitting: Internal Medicine

## 2013-05-04 VITALS — BP 106/70 | HR 60 | Temp 97.8°F | Resp 16 | Wt 183.0 lb

## 2013-05-04 DIAGNOSIS — J309 Allergic rhinitis, unspecified: Secondary | ICD-10-CM

## 2013-05-04 DIAGNOSIS — G43009 Migraine without aura, not intractable, without status migrainosus: Secondary | ICD-10-CM | POA: Insufficient documentation

## 2013-05-04 DIAGNOSIS — Z23 Encounter for immunization: Secondary | ICD-10-CM

## 2013-05-04 DIAGNOSIS — J019 Acute sinusitis, unspecified: Secondary | ICD-10-CM

## 2013-05-04 MED ORDER — CETIRIZINE-PSEUDOEPHEDRINE ER 5-120 MG PO TB12: 1.0000 | ORAL_TABLET | Freq: Two times a day (BID) | ORAL | Status: DC

## 2013-05-04 MED ORDER — DICLOFENAC POTASSIUM(MIGRAINE) 50 MG PO PACK: 1.0000 | PACK | ORAL | Status: DC

## 2013-05-04 MED ORDER — CEFUROXIME AXETIL 500 MG PO TABS: 500.0000 mg | ORAL_TABLET | Freq: Two times a day (BID) | ORAL | Status: DC

## 2013-05-04 MED ORDER — ELETRIPTAN HYDROBROMIDE 40 MG PO TABS: 40.0000 mg | ORAL_TABLET | ORAL | Status: DC | PRN

## 2013-05-04 NOTE — Assessment & Plan Note (Signed)
Will change to oral meds to zyrtec-d for his convenience

## 2013-05-04 NOTE — Progress Notes (Signed)
Subjective:    Patient ID: Nathan Park, male    DOB: [birth year], 47 y.o.   MRN: [id number]  Migraine  This is a chronic problem. The current episode started more than 1 year ago. Episode frequency: every 1-2 weeks. The problem has been unchanged. The pain is located in the bilateral, frontal and retro-orbital region. The pain does not radiate. The pain quality is similar to prior headaches. The quality of the pain is described as aching and throbbing. The pain is at a severity of 3/10. The pain is mild. Associated symptoms include sinus pressure. Pertinent negatives include no abdominal pain, abnormal behavior, anorexia, back pain, blurred vision, coughing, dizziness, drainage, ear pain, eye pain, eye redness, eye watering, facial sweating, fever, hearing loss, insomnia, loss of balance, muscle aches, nausea, neck pain, numbness, phonophobia, photophobia, rhinorrhea, scalp tenderness, seizures, sore throat, swollen glands, tingling, tinnitus, visual change, vomiting, weakness or weight loss. He has tried NSAIDs (he takes goody powders every day) for the symptoms. The treatment provided moderate relief. His past medical history is significant for migraine headaches and migraines in the family. There is no history of cancer, cluster headaches, hypertension, immunosuppression, obesity, pseudotumor cerebri, recent head traumas, sinus disease or TMJ.      Review of Systems  Constitutional: Negative.  Negative for fever, chills, weight loss, diaphoresis, appetite change, fatigue and unexpected weight change.  HENT: Positive for congestion, postnasal drip and sinus pressure. Negative for hearing loss, ear pain, nosebleeds, sore throat, facial swelling, rhinorrhea, sneezing, mouth sores, trouble swallowing, neck pain, neck stiffness, dental problem, voice change, tinnitus and ear discharge.   Eyes: Negative.  Negative for blurred vision, photophobia, pain and redness.  Respiratory: Negative for cough.    Cardiovascular: Negative.  Negative for chest pain, palpitations and leg swelling.  Gastrointestinal: Negative.  Negative for nausea, vomiting, abdominal pain, constipation and anorexia.  Endocrine: Negative.   Genitourinary: Negative.   Musculoskeletal: Negative.  Negative for myalgias, back pain, joint swelling and gait problem.  Skin: Negative.   Allergic/Immunologic: Negative.   Neurological: Negative.  Negative for dizziness, tingling, seizures, weakness, numbness and loss of balance.  Hematological: Negative.  Negative for adenopathy. Does not bruise/bleed easily.  Psychiatric/Behavioral: Negative.  The patient does not have insomnia.        Objective:   Physical Exam  Vitals reviewed. Constitutional: He is oriented to person, place, and time. He appears well-developed and well-nourished.  Non-toxic appearance. He does not have a sickly appearance. He does not appear ill. No distress.  HENT:  Head: Normocephalic and atraumatic.  Right Ear: Hearing, tympanic membrane, external ear and ear canal normal.  Left Ear: Hearing, tympanic membrane, external ear and ear canal normal.  Nose: Mucosal edema and rhinorrhea present. No nose lacerations, sinus tenderness, nasal deformity, septal deviation or nasal septal hematoma. No epistaxis.  No foreign bodies. Right sinus exhibits no maxillary sinus tenderness and no frontal sinus tenderness. Left sinus exhibits no maxillary sinus tenderness and no frontal sinus tenderness.  Mouth/Throat: Oropharynx is clear and moist and mucous membranes are normal. Mucous membranes are not pale, not dry and not cyanotic. No oral lesions. No trismus in the jaw. No edematous. No oropharyngeal exudate, posterior oropharyngeal edema, posterior oropharyngeal erythema or tonsillar abscesses.  Eyes: Conjunctivae and EOM are normal. Pupils are equal, round, and reactive to light. Right eye exhibits no discharge. Left eye exhibits no discharge. No scleral icterus.   Neck: Normal range of motion. Neck supple. No JVD present. No tracheal deviation  present. No thyromegaly present.  Cardiovascular: Normal rate, regular rhythm, normal heart sounds and intact distal pulses.  Exam reveals no gallop and no friction rub.   No murmur heard. Pulmonary/Chest: Effort normal and breath sounds normal. No stridor. No respiratory distress. He has no wheezes. He has no rales. He exhibits no tenderness.  Abdominal: Soft. Bowel sounds are normal. He exhibits no distension and no mass. There is no tenderness. There is no rebound and no guarding.  Musculoskeletal: Normal range of motion. He exhibits no edema and no tenderness.  Lymphadenopathy:    He has no cervical adenopathy.  Neurological: He is alert and oriented to person, place, and time. He has normal reflexes. He displays normal reflexes. No cranial nerve deficit. He exhibits normal muscle tone. Coordination normal.  Skin: Skin is warm and dry. No rash noted. He is not diaphoretic. No erythema. No pallor.  Psychiatric: He has a normal mood and affect. His behavior is normal. Judgment and thought content normal.     Lab Results  Component Value Date   WBC 6.9 03/02/2013   HGB 15.8 03/02/2013   HCT 45.8 03/02/2013   PLT 207.0 03/02/2013   GLUCOSE 103* 03/02/2013   CHOL 131 03/02/2013   TRIG 105.0 03/02/2013   HDL 42.20 03/02/2013   LDLCALC 68 03/02/2013   ALT 27 03/02/2013   AST 23 03/02/2013   NA 140 03/02/2013   K 5.0 03/02/2013   CL 105 03/02/2013   CREATININE 0.9 03/02/2013   BUN 14 03/02/2013   CO2 30 03/02/2013   TSH 0.46 03/02/2013   INR 1.08 03/25/2010   HGBA1C 6.5 03/02/2013   MICROALBUR 0.2 03/02/2013       Assessment & Plan:

## 2013-05-04 NOTE — Assessment & Plan Note (Signed)
Will treat the infection with ceftin I have asked him to start zyrtec-d as well

## 2013-05-04 NOTE — Assessment & Plan Note (Signed)
I am concerned that he may have rebound and med abuse headaches with a prior history of migraine therefore I have asked him to stop taking goody powders and to stop taking a pain med on a regular/daily basis. I have advised him to take relpax +/- cambia as needed for the headahce. If he continues to have frequent headaches then I will consider topamax for prevention

## 2013-05-04 NOTE — Patient Instructions (Signed)
Migraine Headache A migraine headache is an intense, throbbing pain on one or both sides of your head. A migraine can last for 30 minutes to several hours. CAUSES  The exact cause of a migraine headache is not always known. However, a migraine may be caused when nerves in the brain become irritated and release chemicals that cause inflammation. This causes pain. SYMPTOMS  Pain on one or both sides of your head.  Pulsating or throbbing pain.  Severe pain that prevents daily activities.  Pain that is aggravated by any physical activity.  Nausea, vomiting, or both.  Dizziness.  Pain with exposure to bright lights, loud noises, or activity.  General sensitivity to bright lights, loud noises, or smells. Before you get a migraine, you may get warning signs that a migraine is coming (aura). An aura may include:  Seeing flashing lights.  Seeing bright spots, halos, or zig-zag lines.  Having tunnel vision or blurred vision.  Having feelings of numbness or tingling.  Having trouble talking.  Having muscle weakness. MIGRAINE TRIGGERS  Alcohol.  Smoking.  Stress.  Menstruation.  Aged cheeses.  Foods or drinks that contain nitrates, glutamate, aspartame, or tyramine.  Lack of sleep.  Chocolate.  Caffeine.  Hunger.  Physical exertion.  Fatigue.  Medicines used to treat chest pain (nitroglycerine), birth control pills, estrogen, and some blood pressure medicines. DIAGNOSIS  A migraine headache is often diagnosed based on:  Symptoms.  Physical examination.  A CT scan or MRI of your head. TREATMENT Medicines may be given for pain and nausea. Medicines can also be given to help prevent recurrent migraines.  HOME CARE INSTRUCTIONS  Only take over-the-counter or prescription medicines for pain or discomfort as directed by your caregiver. The use of long-term narcotics is not recommended.  Lie down in a dark, quiet room when you have a migraine.  Keep a journal  to find out what may trigger your migraine headaches. For example, write down:  What you eat and drink.  How much sleep you get.  Any change to your diet or medicines.  Limit alcohol consumption.  Quit smoking if you smoke.  Get 7 to 9 hours of sleep, or as recommended by your caregiver.  Limit stress.  Keep lights dim if bright lights bother you and make your migraines worse. SEEK IMMEDIATE MEDICAL CARE IF:   Your migraine becomes severe.  You have a fever.  You have a stiff neck.  You have vision loss.  You have muscular weakness or loss of muscle control.  You start losing your balance or have trouble walking.  You feel faint or pass out.  You have severe symptoms that are different from your first symptoms. MAKE SURE YOU:   Understand these instructions.  Will watch your condition.  Will get help right away if you are not doing well or get worse. Document Released: 08/04/2005 Document Revised: 10/27/2011 Document Reviewed: 07/25/2011 [employer] Patient Information 2014 [employer].  

## 2013-06-01 ENCOUNTER — Telehealth: Payer: Self-pay | Admitting: *Deleted

## 2013-06-01 ENCOUNTER — Other Ambulatory Visit: Payer: Self-pay | Admitting: *Deleted

## 2013-06-01 DIAGNOSIS — G43009 Migraine without aura, not intractable, without status migrainosus: Secondary | ICD-10-CM

## 2013-06-01 MED ORDER — DICLOFENAC POTASSIUM(MIGRAINE) 50 MG PO PACK: 1.0000 | PACK | ORAL | Status: DC

## 2013-06-01 NOTE — Telephone Encounter (Signed)
Pt called states the Relpax was not effective at all however the Cambia worked great.

## 2013-06-16 ENCOUNTER — Telehealth: Payer: Self-pay | Admitting: Internal Medicine

## 2013-06-16 DIAGNOSIS — G43009 Migraine without aura, not intractable, without status migrainosus: Secondary | ICD-10-CM

## 2013-06-16 NOTE — Telephone Encounter (Signed)
06/16/2013  Pt left message asking for 6-8 samples of Tambia (?) for migraines.  Pt stated that he has not met his deduct for RX and the meds will costs him too much. Please advise.

## 2013-06-17 MED ORDER — DICLOFENAC POTASSIUM(MIGRAINE) 50 MG PO PACK: 1.0000 | PACK | ORAL | Status: DC

## 2013-06-17 NOTE — Telephone Encounter (Signed)
Only three packs available at this time, pt notified

## 2013-09-20 ENCOUNTER — Telehealth: Payer: Self-pay | Admitting: Internal Medicine

## 2013-09-20 NOTE — Telephone Encounter (Signed)
Pt request sample of Relpax. Please call pt

## 2013-09-22 NOTE — Telephone Encounter (Signed)
Returned patient's call requesting samples of relpax---none available.  Left message on his voicemail.

## 2013-09-23 ENCOUNTER — Telehealth: Payer: Self-pay

## 2013-09-23 DIAGNOSIS — G43009 Migraine without aura, not intractable, without status migrainosus: Secondary | ICD-10-CM

## 2013-09-23 MED ORDER — ELETRIPTAN HYDROBROMIDE 40 MG PO TABS: 40.0000 mg | ORAL_TABLET | ORAL | Status: DC | PRN

## 2013-09-23 NOTE — Telephone Encounter (Signed)
The patient called and is hoping to get a medicine called in called Relpax for migraines.  He states it was prescribed previously, but could not afford the medication.  However, his new insurance does cover the medication now.  Pharmacy - [facility] (pt stated he switched to mail order)   Callback - [phone]

## 2013-09-23 NOTE — Telephone Encounter (Signed)
RX sent per pt request.  

## 2013-10-10 ENCOUNTER — Telehealth: Payer: Self-pay | Admitting: *Deleted

## 2013-10-10 NOTE — Telephone Encounter (Signed)
Patient phoned requesting clarification of frequency for his Relpax.  Attempted to return patient's call, left voicemail message with ordered instructions.

## 2013-10-26 ENCOUNTER — Encounter: Payer: Self-pay | Admitting: Internal Medicine

## 2013-10-26 ENCOUNTER — Ambulatory Visit (INDEPENDENT_AMBULATORY_CARE_PROVIDER_SITE_OTHER): Payer: 59 | Admitting: Internal Medicine

## 2013-10-26 ENCOUNTER — Other Ambulatory Visit (INDEPENDENT_AMBULATORY_CARE_PROVIDER_SITE_OTHER): Payer: 59

## 2013-10-26 VITALS — BP 114/88 | HR 95 | Temp 99.4°F | Resp 16 | Ht 66.0 in | Wt 187.0 lb

## 2013-10-26 DIAGNOSIS — IMO0001 Reserved for inherently not codable concepts without codable children: Secondary | ICD-10-CM

## 2013-10-26 DIAGNOSIS — N529 Male erectile dysfunction, unspecified: Secondary | ICD-10-CM

## 2013-10-26 DIAGNOSIS — J309 Allergic rhinitis, unspecified: Secondary | ICD-10-CM

## 2013-10-26 DIAGNOSIS — E1165 Type 2 diabetes mellitus with hyperglycemia: Principal | ICD-10-CM

## 2013-10-26 LAB — URINALYSIS, ROUTINE W REFLEX MICROSCOPIC
Bilirubin Urine: NEGATIVE
Ketones, ur: NEGATIVE
LEUKOCYTES UA: NEGATIVE
Nitrite: NEGATIVE
Urine Glucose: NEGATIVE
Urobilinogen, UA: 0.2 (ref 0.0–1.0)
pH: 6 (ref 5.0–8.0)

## 2013-10-26 LAB — CBC WITH DIFFERENTIAL/PLATELET
Basophils Absolute: 0 10*3/uL (ref 0.0–0.1)
Basophils Relative: 0.6 % (ref 0.0–3.0)
EOS PCT: 4.7 % (ref 0.0–5.0)
Eosinophils Absolute: 0.4 10*3/uL (ref 0.0–0.7)
HEMATOCRIT: 46.2 % (ref 39.0–52.0)
Hemoglobin: 15.9 g/dL (ref 13.0–17.0)
LYMPHS ABS: 1.7 10*3/uL (ref 0.7–4.0)
Lymphocytes Relative: 21.5 % (ref 12.0–46.0)
MCHC: 34.5 g/dL (ref 30.0–36.0)
MCV: 88.4 fl (ref 78.0–100.0)
MONOS PCT: 8 % (ref 3.0–12.0)
Monocytes Absolute: 0.6 10*3/uL (ref 0.1–1.0)
NEUTROS PCT: 65.2 % (ref 43.0–77.0)
Neutro Abs: 5.2 10*3/uL (ref 1.4–7.7)
Platelets: 227 10*3/uL (ref 150.0–400.0)
RBC: 5.23 Mil/uL (ref 4.22–5.81)
RDW: 12.8 % (ref 11.5–14.6)
WBC: 8.1 10*3/uL (ref 4.5–10.5)

## 2013-10-26 LAB — BASIC METABOLIC PANEL
BUN: 16 mg/dL (ref 6–23)
CO2: 28 meq/L (ref 19–32)
Calcium: 9.2 mg/dL (ref 8.4–10.5)
Chloride: 102 mEq/L (ref 96–112)
Creatinine, Ser: 1 mg/dL (ref 0.4–1.5)
GFR: 81.34 mL/min (ref >60.00)
GLUCOSE: 166 mg/dL — AB (ref 70–99)
POTASSIUM: 4.2 meq/L (ref 3.5–5.1)
SODIUM: 138 meq/L (ref 135–145)

## 2013-10-26 LAB — PSA: PSA: 3.14 ng/mL (ref 0.10–4.00)

## 2013-10-26 LAB — HEMOGLOBIN A1C: HEMOGLOBIN A1C: 7.8 % — AB (ref 4.6–6.5)

## 2013-10-26 LAB — TSH: TSH: 0.74 u[IU]/mL (ref 0.35–5.50)

## 2013-10-26 MED ORDER — SILDENAFIL CITRATE 50 MG PO TABS: 50.0000 mg | ORAL_TABLET | Freq: Every day | ORAL | Status: DC | PRN

## 2013-10-26 MED ORDER — FLUTICASONE PROPIONATE 50 MCG/ACT NA SUSP: 2.0000 | Freq: Every day | NASAL | Status: DC

## 2013-10-26 NOTE — Patient Instructions (Signed)
Type 2 Diabetes Mellitus, Adult Type 2 diabetes mellitus, often simply referred to as type 2 diabetes, is a long-lasting (chronic) disease. In type 2 diabetes, the pancreas does not make enough insulin (a hormone), the cells are less responsive to the insulin that is made (insulin resistance), or both. Normally, insulin moves sugars from food into the tissue cells. The tissue cells use the sugars for energy. The lack of insulin or the lack of normal response to insulin causes excess sugars to build up in the blood instead of going into the tissue cells. As a result, high blood sugar (hyperglycemia) develops. The effect of high sugar (glucose) levels can cause many complications. Type 2 diabetes was also previously called adult-onset diabetes but it can occur at any age.  RISK FACTORS  A person is predisposed to developing type 2 diabetes if someone in the family has the disease and also has one or more of the following primary risk factors:  Overweight.  An inactive lifestyle.  A history of consistently eating high-calorie foods. Maintaining a normal weight and regular physical activity can reduce the chance of developing type 2 diabetes. SYMPTOMS  A person with type 2 diabetes may not show symptoms initially. The symptoms of type 2 diabetes appear slowly. The symptoms include:  Increased thirst (polydipsia).  Increased urination (polyuria).  Increased urination during the night (nocturia).  Weight loss. This weight loss may be rapid.  Frequent, recurring infections.  Tiredness (fatigue).  Weakness.  Vision changes, such as blurred vision.  Fruity smell to your breath.  Abdominal pain.  Nausea or vomiting.  Cuts or bruises which are slow to heal.  Tingling or numbness in the hands or feet. DIAGNOSIS Type 2 diabetes is frequently not diagnosed until complications of diabetes are present. Type 2 diabetes is diagnosed when symptoms or complications are present and when blood  glucose levels are increased. Your blood glucose level may be checked by one or more of the following blood tests:  A fasting blood glucose test. You will not be allowed to eat for at least 8 hours before a blood sample is taken.  A random blood glucose test. Your blood glucose is checked at any time of the day regardless of when you ate.  A hemoglobin A1c blood glucose test. A hemoglobin A1c test provides information about blood glucose control over the previous 3 months.  An oral glucose tolerance test (OGTT). Your blood glucose is measured after you have not eaten (fasted) for 2 hours and then after you drink a glucose-containing beverage. TREATMENT   You may need to take insulin or diabetes medicine daily to keep blood glucose levels in the desired range.  You will need to match insulin dosing with exercise and healthy food choices. The treatment goal is to maintain the before meal blood sugar (preprandial glucose) level at 70 130 mg/dL. HOME CARE INSTRUCTIONS   Have your hemoglobin A1c level checked twice a year.  Perform daily blood glucose monitoring as directed by your caregiver.  Monitor urine ketones when you are ill and as directed by your caregiver.  Take your diabetes medicine or insulin as directed by your caregiver to maintain your blood glucose levels in the desired range.  Never run out of diabetes medicine or insulin. It is needed every day.  Adjust insulin based on your intake of carbohydrates. Carbohydrates can raise blood glucose levels but need to be included in your diet. Carbohydrates provide vitamins, minerals, and fiber which are an essential part of   a healthy diet. Carbohydrates are found in fruits, vegetables, whole grains, dairy products, legumes, and foods containing added sugars.    Eat healthy foods. Alternate 3 meals with 3 snacks.  Lose weight if overweight.  Carry a medical alert card or wear your medical alert jewelry.  Carry a 15 gram  carbohydrate snack with you at all times to treat low blood glucose (hypoglycemia). Some examples of 15 gram carbohydrate snacks include:  Glucose tablets, 3 or 4   Glucose gel, 15 gram tube  Raisins, 2 tablespoons (24 grams)  Jelly beans, 6  Animal crackers, 8  Regular pop, 4 ounces (120 mL)  Gummy treats, 9  Recognize hypoglycemia. Hypoglycemia occurs with blood glucose levels of 70 mg/dL and below. The risk for hypoglycemia increases when fasting or skipping meals, during or after intense exercise, and during sleep. Hypoglycemia symptoms can include:  Tremors or shakes.  Decreased ability to concentrate.  Sweating.  Increased heart rate.  Headache.  Dry mouth.  Hunger.  Irritability.  Anxiety.  Restless sleep.  Altered speech or coordination.  Confusion.  Treat hypoglycemia promptly. If you are alert and able to safely swallow, follow the 15:15 rule:  Take 15 20 grams of rapid-acting glucose or carbohydrate. Rapid-acting options include glucose gel, glucose tablets, or 4 ounces (120 mL) of fruit juice, regular soda, or low fat milk.  Check your blood glucose level 15 minutes after taking the glucose.  Take 15 20 grams more of glucose if the repeat blood glucose level is still 70 mg/dL or below.  Eat a meal or snack within 1 hour once blood glucose levels return to normal.    Be alert to polyuria and polydipsia which are early signs of hyperglycemia. An early awareness of hyperglycemia allows for prompt treatment. Treat hyperglycemia as directed by your caregiver.  Engage in at least 150 minutes of moderate-intensity physical activity a week, spread over at least 3 days of the week or as directed by your caregiver. In addition, you should engage in resistance exercise at least 2 times a week or as directed by your caregiver.  Adjust your medicine and food intake as needed if you start a new exercise or sport.  Follow your sick day plan at any time you  are unable to eat or drink as usual.  Avoid tobacco use.  Limit alcohol intake to no more than 1 drink per day for nonpregnant women and 2 drinks per day for men. You should drink alcohol only when you are also eating food. Talk with your caregiver whether alcohol is safe for you. Tell your caregiver if you drink alcohol several times a week.  Follow up with your caregiver regularly.  Schedule an eye exam soon after the diagnosis of type 2 diabetes and then annually.  Perform daily skin and foot care. Examine your skin and feet daily for cuts, bruises, redness, nail problems, bleeding, blisters, or sores. A foot exam by a caregiver should be done annually.  Brush your teeth and gums at least twice a day and floss at least once a day. Follow up with your dentist regularly.  Share your diabetes management plan with your workplace or school.  Stay up-to-date with immunizations.  Learn to manage stress.  Obtain ongoing diabetes education and support as needed.  Participate in, or seek rehabilitation as needed to maintain or improve independence and quality of life. Request a physical or occupational therapy referral if you are having foot or hand numbness or difficulties with grooming,   dressing, eating, or physical activity. SEEK MEDICAL CARE IF:   You are unable to eat food or drink fluids for more than 6 hours.  You have nausea and vomiting for more than 6 hours.  Your blood glucose level is over 240 mg/dL.  There is a change in mental status.  You develop an additional serious illness.  You have diarrhea for more than 6 hours.  You have been sick or have had a fever for a couple of days and are not getting better.  You have pain during any physical activity.  SEEK IMMEDIATE MEDICAL CARE IF:  You have difficulty breathing.  You have moderate to large ketone levels. MAKE SURE YOU:  Understand these instructions.  Will watch your condition.  Will get help right away if  you are not doing well or get worse. Document Released: 08/04/2005 Document Revised: 04/28/2012 Document Reviewed: 03/02/2012 [employer] Patient Information 2014 [employer].  

## 2013-10-26 NOTE — Progress Notes (Signed)
Subjective:    Patient ID: Nathan Park, male    DOB: 18-Jan-1966, 48 y.o.   MRN: [id number]  Diabetes He presents for his follow-up diabetic visit. He has type 2 diabetes mellitus. His disease course has been worsening. There are no hypoglycemic associated symptoms. Associated symptoms include polyphagia. Pertinent negatives for diabetes include no blurred vision, no chest pain, no fatigue, no foot paresthesias, no foot ulcerations, no polydipsia, no polyuria, no visual change, no weakness and no weight loss. There are no hypoglycemic complications. Symptoms are worsening. Diabetic complications include impotence. When asked about current treatments, none were reported. He is following a generally healthy diet. Meal planning includes avoidance of concentrated sweets. He has not had a previous visit with a dietician. He participates in exercise three times a week. His home blood glucose trend is increasing steadily. An ACE inhibitor/angiotensin II receptor blocker is not being taken. He does not see a podiatrist.Eye exam is current.  Erectile Dysfunction This is a new problem. The current episode started more than 1 month ago. The problem has been gradually worsening since onset. The nature of his difficulty is achieving erection, maintaining erection and penetration. He reports no anxiety, decreased libido or performance anxiety. Irritative symptoms do not include frequency, nocturia or urgency. Obstructive symptoms do not include dribbling, incomplete emptying, an intermittent stream, a slower stream, straining or a weak stream. Pertinent negatives include no chills, dysuria, genital pain, hematuria, hesitancy or inability to urinate. Nothing aggravates the symptoms. Past treatments include nothing.      Review of Systems  Constitutional: Negative.  Negative for fever, chills, weight loss, diaphoresis, appetite change, fatigue and unexpected weight change.  HENT: Negative.   Eyes: Negative.   Negative for blurred vision.  Respiratory: Negative.  Negative for cough, choking, chest tightness, shortness of breath and stridor.   Cardiovascular: Negative.  Negative for chest pain, palpitations and leg swelling.  Gastrointestinal: Negative.  Negative for abdominal pain.  Endocrine: Positive for polyphagia. Negative for polydipsia and polyuria.  Genitourinary: Positive for impotence. Negative for dysuria, hesitancy, urgency, frequency, hematuria, flank pain, decreased urine volume, penile swelling, scrotal swelling, difficulty urinating, testicular pain, decreased libido, incomplete emptying and nocturia.  Musculoskeletal: Negative.   Skin: Negative.   Allergic/Immunologic: Negative.   Neurological: Negative.  Negative for weakness.  Hematological: Negative.  Negative for adenopathy. Does not bruise/bleed easily.  Psychiatric/Behavioral: Negative.        Objective:   Physical Exam  Vitals reviewed. Constitutional: He is oriented to person, place, and time. He appears well-developed and well-nourished. No distress.  HENT:  Head: Normocephalic and atraumatic.  Mouth/Throat: Oropharynx is clear and moist. No oropharyngeal exudate.  Eyes: Conjunctivae are normal. Right eye exhibits no discharge. Left eye exhibits no discharge. No scleral icterus.  Neck: Normal range of motion. Neck supple. No JVD present. No tracheal deviation present. No thyromegaly present.  Cardiovascular: Normal rate, regular rhythm, normal heart sounds and intact distal pulses.  Exam reveals no gallop and no friction rub.   No murmur heard. Pulmonary/Chest: Effort normal and breath sounds normal. No stridor. No respiratory distress. He has no wheezes. He has no rales. He exhibits no tenderness.  Abdominal: Soft. Bowel sounds are normal. He exhibits no distension and no mass. There is no tenderness. There is no rebound and no guarding. Hernia confirmed negative in the right inguinal area and confirmed negative in the  left inguinal area.  Genitourinary: Rectum normal, prostate normal, testes normal and penis normal. Rectal exam shows  no external hemorrhoid, no internal hemorrhoid, no fissure, no mass, no tenderness and anal tone normal. Guaiac negative stool. Prostate is not enlarged and not tender. Right testis shows no mass, no swelling and no tenderness. Right testis is descended. Left testis shows no mass, no swelling and no tenderness. Left testis is descended. Circumcised. No penile erythema or penile tenderness. No discharge found.  Musculoskeletal: Normal range of motion. He exhibits no edema and no tenderness.  Lymphadenopathy:    He has no cervical adenopathy.       Right: No inguinal adenopathy present.       Left: No inguinal adenopathy present.  Neurological: He is oriented to person, place, and time.  Skin: Skin is warm and dry. No rash noted. He is not diaphoretic. No erythema. No pallor.  Psychiatric: He has a normal mood and affect. His behavior is normal. Judgment and thought content normal.     Lab Results  Component Value Date   WBC 6.9 03/02/2013   HGB 15.8 03/02/2013   HCT 45.8 03/02/2013   PLT 207.0 03/02/2013   GLUCOSE 103* 03/02/2013   CHOL 131 03/02/2013   TRIG 105.0 03/02/2013   HDL 42.20 03/02/2013   LDLCALC 68 03/02/2013   ALT 27 03/02/2013   AST 23 03/02/2013   NA 140 03/02/2013   K 5.0 03/02/2013   CL 105 03/02/2013   CREATININE 0.9 03/02/2013   BUN 14 03/02/2013   CO2 30 03/02/2013   TSH 0.46 03/02/2013   INR 1.08 03/25/2010   HGBA1C 6.5 03/02/2013   MICROALBUR 0.2 03/02/2013       Assessment & Plan:

## 2013-10-26 NOTE — Progress Notes (Signed)
Pre visit review using our clinic review tool, if applicable. No additional management support is needed unless otherwise documented below in the visit note. 

## 2013-10-27 ENCOUNTER — Ambulatory Visit (INDEPENDENT_AMBULATORY_CARE_PROVIDER_SITE_OTHER): Payer: 59 | Admitting: Internal Medicine

## 2013-10-27 ENCOUNTER — Encounter: Payer: Self-pay | Admitting: Internal Medicine

## 2013-10-27 VITALS — BP 110/80 | HR 80 | Temp 97.1°F | Resp 16 | Ht 66.0 in | Wt 188.8 lb

## 2013-10-27 DIAGNOSIS — E1165 Type 2 diabetes mellitus with hyperglycemia: Principal | ICD-10-CM

## 2013-10-27 DIAGNOSIS — IMO0001 Reserved for inherently not codable concepts without codable children: Secondary | ICD-10-CM

## 2013-10-27 MED ORDER — SITAGLIP PHOS-METFORMIN HCL ER 100-1000 MG PO TB24: 1.0000 | ORAL_TABLET | Freq: Every day | ORAL | Status: DC

## 2013-10-27 NOTE — Progress Notes (Signed)
Pre visit review using our clinic review tool, if applicable. No additional management support is needed unless otherwise documented below in the visit note. 

## 2013-10-27 NOTE — Progress Notes (Signed)
   Subjective:    Patient ID: Nathan Park, male    DOB: May 18, 1966, 48 y.o.   MRN: [id number]  HPI Comments: He returns to start meds for DM2.     Review of Systems  All other systems reviewed and are negative.       Objective:   Physical Exam  Vitals reviewed. Constitutional: He is oriented to person, place, and time. He appears well-developed and well-nourished. No distress.  HENT:  Head: Normocephalic and atraumatic.  Mouth/Throat: Oropharynx is clear and moist. No oropharyngeal exudate.  Eyes: Conjunctivae are normal. Right eye exhibits no discharge. Left eye exhibits no discharge. No scleral icterus.  Neck: Normal range of motion. Neck supple. No JVD present. No tracheal deviation present. No thyromegaly present.  Cardiovascular: Normal rate, regular rhythm, normal heart sounds and intact distal pulses.  Exam reveals no gallop and no friction rub.   No murmur heard. Pulmonary/Chest: Effort normal and breath sounds normal. No stridor. No respiratory distress. He has no wheezes. He has no rales. He exhibits no tenderness.  Abdominal: Soft. Bowel sounds are normal. He exhibits no distension. There is no tenderness. There is no rebound and no guarding.  Musculoskeletal: Normal range of motion. He exhibits no edema and no tenderness.  Lymphadenopathy:    He has no cervical adenopathy.  Neurological: He is oriented to person, place, and time.  Skin: Skin is warm and dry. No rash noted. He is not diaphoretic. No erythema. No pallor.  Psychiatric: He has a normal mood and affect. His behavior is normal. Judgment and thought content normal.     Lab Results  Component Value Date   WBC 8.1 10/26/2013   HGB 15.9 10/26/2013   HCT 46.2 10/26/2013   PLT 227.0 10/26/2013   GLUCOSE 166* 10/26/2013   CHOL 131 03/02/2013   TRIG 105.0 03/02/2013   HDL 42.20 03/02/2013   LDLCALC 68 03/02/2013   ALT 27 03/02/2013   AST 23 03/02/2013   NA 138 10/26/2013   K 4.2 10/26/2013   CL 102 10/26/2013   CREATININE 1.0 10/26/2013   BUN 16 10/26/2013   CO2 28 10/26/2013   TSH 0.74 10/26/2013   PSA 3.14 10/26/2013   INR 1.08 03/25/2010   HGBA1C 7.8* 10/26/2013   MICROALBUR 0.2 03/02/2013       Assessment & Plan:

## 2013-10-27 NOTE — Assessment & Plan Note (Signed)
I will check his c-peptide to try to discern how much insulin he produces Will start Janumet-XR for this

## 2013-10-27 NOTE — Assessment & Plan Note (Signed)
His A1C has suddenly risen I will check his A1C to see if this is type I or type II He will RTC soone to start therapy

## 2013-10-27 NOTE — Assessment & Plan Note (Signed)
I think this is related the the DM2 I will check his labs to look for secondary causes He will try viagra to help with the symptoms

## 2013-10-27 NOTE — Patient Instructions (Signed)
Type 2 Diabetes Mellitus, Adult Type 2 diabetes mellitus, often simply referred to as type 2 diabetes, is a long-lasting (chronic) disease. In type 2 diabetes, the pancreas does not make enough insulin (a hormone), the cells are less responsive to the insulin that is made (insulin resistance), or both. Normally, insulin moves sugars from food into the tissue cells. The tissue cells use the sugars for energy. The lack of insulin or the lack of normal response to insulin causes excess sugars to build up in the blood instead of going into the tissue cells. As a result, high blood sugar (hyperglycemia) develops. The effect of high sugar (glucose) levels can cause many complications. Type 2 diabetes was also previously called adult-onset diabetes but it can occur at any age.  RISK FACTORS  A person is predisposed to developing type 2 diabetes if someone in the family has the disease and also has one or more of the following primary risk factors:  Overweight.  An inactive lifestyle.  A history of consistently eating high-calorie foods. Maintaining a normal weight and regular physical activity can reduce the chance of developing type 2 diabetes. SYMPTOMS  A person with type 2 diabetes may not show symptoms initially. The symptoms of type 2 diabetes appear slowly. The symptoms include:  Increased thirst (polydipsia).  Increased urination (polyuria).  Increased urination during the night (nocturia).  Weight loss. This weight loss may be rapid.  Frequent, recurring infections.  Tiredness (fatigue).  Weakness.  Vision changes, such as blurred vision.  Fruity smell to your breath.  Abdominal pain.  Nausea or vomiting.  Cuts or bruises which are slow to heal.  Tingling or numbness in the hands or feet. DIAGNOSIS Type 2 diabetes is frequently not diagnosed until complications of diabetes are present. Type 2 diabetes is diagnosed when symptoms or complications are present and when blood  glucose levels are increased. Your blood glucose level may be checked by one or more of the following blood tests:  A fasting blood glucose test. You will not be allowed to eat for at least 8 hours before a blood sample is taken.  A random blood glucose test. Your blood glucose is checked at any time of the day regardless of when you ate.  A hemoglobin A1c blood glucose test. A hemoglobin A1c test provides information about blood glucose control over the previous 3 months.  An oral glucose tolerance test (OGTT). Your blood glucose is measured after you have not eaten (fasted) for 2 hours and then after you drink a glucose-containing beverage. TREATMENT   You may need to take insulin or diabetes medicine daily to keep blood glucose levels in the desired range.  You will need to match insulin dosing with exercise and healthy food choices. The treatment goal is to maintain the before meal blood sugar (preprandial glucose) level at 70 130 mg/dL. HOME CARE INSTRUCTIONS   Have your hemoglobin A1c level checked twice a year.  Perform daily blood glucose monitoring as directed by your caregiver.  Monitor urine ketones when you are ill and as directed by your caregiver.  Take your diabetes medicine or insulin as directed by your caregiver to maintain your blood glucose levels in the desired range.  Never run out of diabetes medicine or insulin. It is needed every day.  Adjust insulin based on your intake of carbohydrates. Carbohydrates can raise blood glucose levels but need to be included in your diet. Carbohydrates provide vitamins, minerals, and fiber which are an essential part of   a healthy diet. Carbohydrates are found in fruits, vegetables, whole grains, dairy products, legumes, and foods containing added sugars.    Eat healthy foods. Alternate 3 meals with 3 snacks.  Lose weight if overweight.  Carry a medical alert card or wear your medical alert jewelry.  Carry a 15 gram  carbohydrate snack with you at all times to treat low blood glucose (hypoglycemia). Some examples of 15 gram carbohydrate snacks include:  Glucose tablets, 3 or 4   Glucose gel, 15 gram tube  Raisins, 2 tablespoons (24 grams)  Jelly beans, 6  Animal crackers, 8  Regular pop, 4 ounces (120 mL)  Gummy treats, 9  Recognize hypoglycemia. Hypoglycemia occurs with blood glucose levels of 70 mg/dL and below. The risk for hypoglycemia increases when fasting or skipping meals, during or after intense exercise, and during sleep. Hypoglycemia symptoms can include:  Tremors or shakes.  Decreased ability to concentrate.  Sweating.  Increased heart rate.  Headache.  Dry mouth.  Hunger.  Irritability.  Anxiety.  Restless sleep.  Altered speech or coordination.  Confusion.  Treat hypoglycemia promptly. If you are alert and able to safely swallow, follow the 15:15 rule:  Take 15 20 grams of rapid-acting glucose or carbohydrate. Rapid-acting options include glucose gel, glucose tablets, or 4 ounces (120 mL) of fruit juice, regular soda, or low fat milk.  Check your blood glucose level 15 minutes after taking the glucose.  Take 15 20 grams more of glucose if the repeat blood glucose level is still 70 mg/dL or below.  Eat a meal or snack within 1 hour once blood glucose levels return to normal.    Be alert to polyuria and polydipsia which are early signs of hyperglycemia. An early awareness of hyperglycemia allows for prompt treatment. Treat hyperglycemia as directed by your caregiver.  Engage in at least 150 minutes of moderate-intensity physical activity a week, spread over at least 3 days of the week or as directed by your caregiver. In addition, you should engage in resistance exercise at least 2 times a week or as directed by your caregiver.  Adjust your medicine and food intake as needed if you start a new exercise or sport.  Follow your sick day plan at any time you  are unable to eat or drink as usual.  Avoid tobacco use.  Limit alcohol intake to no more than 1 drink per day for nonpregnant women and 2 drinks per day for men. You should drink alcohol only when you are also eating food. Talk with your caregiver whether alcohol is safe for you. Tell your caregiver if you drink alcohol several times a week.  Follow up with your caregiver regularly.  Schedule an eye exam soon after the diagnosis of type 2 diabetes and then annually.  Perform daily skin and foot care. Examine your skin and feet daily for cuts, bruises, redness, nail problems, bleeding, blisters, or sores. A foot exam by a caregiver should be done annually.  Brush your teeth and gums at least twice a day and floss at least once a day. Follow up with your dentist regularly.  Share your diabetes management plan with your workplace or school.  Stay up-to-date with immunizations.  Learn to manage stress.  Obtain ongoing diabetes education and support as needed.  Participate in, or seek rehabilitation as needed to maintain or improve independence and quality of life. Request a physical or occupational therapy referral if you are having foot or hand numbness or difficulties with grooming,   dressing, eating, or physical activity. SEEK MEDICAL CARE IF:   You are unable to eat food or drink fluids for more than 6 hours.  You have nausea and vomiting for more than 6 hours.  Your blood glucose level is over 240 mg/dL.  There is a change in mental status.  You develop an additional serious illness.  You have diarrhea for more than 6 hours.  You have been sick or have had a fever for a couple of days and are not getting better.  You have pain during any physical activity.  SEEK IMMEDIATE MEDICAL CARE IF:  You have difficulty breathing.  You have moderate to large ketone levels. MAKE SURE YOU:  Understand these instructions.  Will watch your condition.  Will get help right away if  you are not doing well or get worse. Document Released: 08/04/2005 Document Revised: 04/28/2012 Document Reviewed: 03/02/2012 [employer] Patient Information 2014 [employer].  

## 2013-11-03 ENCOUNTER — Telehealth: Payer: Self-pay

## 2013-11-03 NOTE — Telephone Encounter (Signed)
Relevant patient education assigned to patient using Emmi. ° °

## 2013-11-09 ENCOUNTER — Encounter: Payer: Self-pay | Admitting: Internal Medicine

## 2013-11-25 ENCOUNTER — Encounter: Payer: Self-pay | Admitting: Internal Medicine

## 2013-11-28 ENCOUNTER — Telehealth: Payer: Self-pay | Admitting: *Deleted

## 2013-11-28 DIAGNOSIS — E1165 Type 2 diabetes mellitus with hyperglycemia: Principal | ICD-10-CM

## 2013-11-28 DIAGNOSIS — IMO0001 Reserved for inherently not codable concepts without codable children: Secondary | ICD-10-CM

## 2013-11-28 MED ORDER — BAYER CONTOUR NEXT EZ W/DEVICE KIT: 1.0000 | PACK | Freq: Two times a day (BID) | Status: DC

## 2013-11-28 MED ORDER — GLUCOSE BLOOD VI STRP: ORAL_STRIP | Status: DC

## 2013-11-28 NOTE — Telephone Encounter (Signed)
He can come pick up a meter if he wants but I will also send an Rx with strpis to his pharmacy

## 2013-11-28 NOTE — Telephone Encounter (Signed)
Pt called on (11-25-13) stating he was seen on (10-27-13) by Dr. Yetta BarreJones and dx with Diabetes.  Pt is wanting to know about keeping track of his BS readings, and also stated that he was not giving a meter or strips.  LMOM informing the pt we received his message and we will get back with him regarding this information.//AB/CMA

## 2013-11-28 NOTE — Telephone Encounter (Signed)
Spoke with the pt and informed him that a BS meter and strips was sent to his pharmacy.  Pt stated that he came by on Friday and picked up a meter, but it did not have the strips.  Pt stated that he will go and get the meter and strips at the pharmacy.//AB/CMA

## 2013-12-04 ENCOUNTER — Encounter: Payer: Self-pay | Admitting: Internal Medicine

## 2013-12-05 ENCOUNTER — Other Ambulatory Visit: Payer: Self-pay | Admitting: Internal Medicine

## 2013-12-05 DIAGNOSIS — G43009 Migraine without aura, not intractable, without status migrainosus: Secondary | ICD-10-CM

## 2013-12-05 MED ORDER — SUMATRIPTAN SUCCINATE 100 MG PO TABS: 100.0000 mg | ORAL_TABLET | ORAL | Status: DC | PRN

## 2013-12-08 ENCOUNTER — Encounter: Payer: Self-pay | Admitting: Internal Medicine

## 2013-12-08 DIAGNOSIS — IMO0001 Reserved for inherently not codable concepts without codable children: Secondary | ICD-10-CM

## 2013-12-08 DIAGNOSIS — E1165 Type 2 diabetes mellitus with hyperglycemia: Principal | ICD-10-CM

## 2013-12-09 MED ORDER — SITAGLIP PHOS-METFORMIN HCL ER 100-1000 MG PO TB24: 1.0000 | ORAL_TABLET | Freq: Every day | ORAL | Status: DC

## 2013-12-09 MED ORDER — GLUCOSE BLOOD VI STRP: ORAL_STRIP | Status: DC

## 2013-12-09 MED ORDER — BAYER CONTOUR NEXT EZ W/DEVICE KIT: 1.0000 | PACK | Freq: Two times a day (BID) | Status: DC

## 2013-12-10 ENCOUNTER — Encounter: Payer: Self-pay | Admitting: Internal Medicine

## 2013-12-10 DIAGNOSIS — IMO0001 Reserved for inherently not codable concepts without codable children: Secondary | ICD-10-CM

## 2013-12-10 DIAGNOSIS — E1165 Type 2 diabetes mellitus with hyperglycemia: Principal | ICD-10-CM

## 2013-12-12 MED ORDER — SITAGLIP PHOS-METFORMIN HCL ER 100-1000 MG PO TB24: 1.0000 | ORAL_TABLET | Freq: Every day | ORAL | Status: DC

## 2013-12-14 ENCOUNTER — Telehealth: Payer: Self-pay | Admitting: *Deleted

## 2013-12-14 NOTE — Telephone Encounter (Signed)
Steward DroneBrenda called states Contour Next Test Strips are no longer covered.  However One Touch Ultra Blue and Verio Gold is covered.  Pt will be issued a new Glucometer as well.  Ref Number:  [id number].  Please advise

## 2013-12-14 NOTE — Telephone Encounter (Signed)
[facility]oke with [facility] advised of MDs message

## 2013-12-14 NOTE — Telephone Encounter (Signed)
ok with me

## 2013-12-16 ENCOUNTER — Telehealth: Payer: Self-pay | Admitting: *Deleted

## 2013-12-16 NOTE — Telephone Encounter (Signed)
Pt called states Junamet is brand, however if it was written as generic the copay would be $0. The fax number for this change is [phone].  Please advise

## 2013-12-18 NOTE — Telephone Encounter (Signed)
There is no generic for janumet

## 2013-12-19 NOTE — Telephone Encounter (Signed)
actos is not a good choice for him There is NO generic for [place]

## 2013-12-19 NOTE — Telephone Encounter (Signed)
Spoke with pt advised of MDs message 

## 2013-12-19 NOTE — Telephone Encounter (Signed)
Spoke with pt he states his insurance gave him the alternative suggestion of Actos which has a generic, Pioglitazone.  Or to prescribe the two medications that make up the Janumet in their generic form.  Please advise

## 2013-12-22 ENCOUNTER — Encounter: Payer: Self-pay | Admitting: Internal Medicine

## 2013-12-22 DIAGNOSIS — IMO0001 Reserved for inherently not codable concepts without codable children: Secondary | ICD-10-CM

## 2013-12-22 DIAGNOSIS — E1165 Type 2 diabetes mellitus with hyperglycemia: Principal | ICD-10-CM

## 2013-12-22 MED ORDER — SITAGLIP PHOS-METFORMIN HCL ER 100-1000 MG PO TB24: 1.0000 | ORAL_TABLET | Freq: Every day | ORAL | Status: DC

## 2013-12-29 ENCOUNTER — Other Ambulatory Visit: Payer: Self-pay

## 2013-12-29 MED ORDER — GLUCOSE BLOOD VI STRP: ORAL_STRIP | Status: AC

## 2013-12-29 MED ORDER — ONETOUCH ULTRA SYSTEM W/DEVICE KIT: 1.0000 | PACK | Freq: Once | Status: DC

## 2014-01-03 ENCOUNTER — Other Ambulatory Visit: Payer: Self-pay

## 2014-01-03 DIAGNOSIS — J309 Allergic rhinitis, unspecified: Secondary | ICD-10-CM

## 2014-01-03 MED ORDER — FLUTICASONE PROPIONATE 50 MCG/ACT NA SUSP: 2.0000 | Freq: Every day | NASAL | Status: DC

## 2014-01-05 ENCOUNTER — Encounter: Payer: Self-pay | Admitting: Internal Medicine

## 2014-01-05 MED ORDER — ONETOUCH DELICA LANCETS 33G MISC: Status: DC

## 2014-01-30 ENCOUNTER — Ambulatory Visit (INDEPENDENT_AMBULATORY_CARE_PROVIDER_SITE_OTHER): Payer: 59 | Admitting: Internal Medicine

## 2014-01-30 ENCOUNTER — Encounter: Payer: Self-pay | Admitting: Internal Medicine

## 2014-01-30 ENCOUNTER — Other Ambulatory Visit (INDEPENDENT_AMBULATORY_CARE_PROVIDER_SITE_OTHER): Payer: 59

## 2014-01-30 VITALS — BP 104/70 | HR 84 | Temp 98.1°F | Resp 16 | Ht 66.0 in | Wt 184.0 lb

## 2014-01-30 DIAGNOSIS — IMO0001 Reserved for inherently not codable concepts without codable children: Secondary | ICD-10-CM

## 2014-01-30 DIAGNOSIS — E1165 Type 2 diabetes mellitus with hyperglycemia: Principal | ICD-10-CM

## 2014-01-30 DIAGNOSIS — E785 Hyperlipidemia, unspecified: Secondary | ICD-10-CM

## 2014-01-30 LAB — LIPID PANEL
CHOLESTEROL: 135 mg/dL (ref 0–200)
HDL: 41.5 mg/dL (ref >39.00)
LDL Cholesterol: 78 mg/dL (ref 0–99)
NonHDL: 93.5
TRIGLYCERIDES: 76 mg/dL (ref 0.0–149.0)
Total CHOL/HDL Ratio: 3
VLDL: 15.2 mg/dL (ref 0.0–40.0)

## 2014-01-30 LAB — COMPREHENSIVE METABOLIC PANEL
ALT: 24 U/L (ref 0–53)
AST: 21 U/L (ref 0–37)
Albumin: 4.5 g/dL (ref 3.5–5.2)
Alkaline Phosphatase: 40 U/L (ref 39–117)
BILIRUBIN TOTAL: 0.7 mg/dL (ref 0.2–1.2)
BUN: 19 mg/dL (ref 6–23)
CHLORIDE: 103 meq/L (ref 96–112)
CO2: 28 meq/L (ref 19–32)
CREATININE: 1.2 mg/dL (ref 0.4–1.5)
Calcium: 9.1 mg/dL (ref 8.4–10.5)
GFR: 70.83 mL/min (ref >60.00)
GLUCOSE: 154 mg/dL — AB (ref 70–99)
Potassium: 5.1 mEq/L (ref 3.5–5.1)
Sodium: 139 mEq/L (ref 135–145)
Total Protein: 7.1 g/dL (ref 6.0–8.3)

## 2014-01-30 LAB — HEMOGLOBIN A1C: HEMOGLOBIN A1C: 6.3 % (ref 4.6–6.5)

## 2014-01-30 NOTE — Progress Notes (Signed)
Subjective:    Patient ID: Nathan PillionHal S Beste, male    DOB: [birth year], 48 y.o.   MRN: [id number]  Diabetes He presents for his follow-up diabetic visit. He has type 2 diabetes mellitus. His disease course has been stable. There are no hypoglycemic associated symptoms. Pertinent negatives for diabetes include no blurred vision, no chest pain, no fatigue, no foot paresthesias, no foot ulcerations, no polydipsia, no polyphagia, no polyuria, no visual change, no weakness and no weight loss. There are no hypoglycemic complications. There are no diabetic complications. Current diabetic treatment includes oral agent (dual therapy). His weight is stable. He is following a generally healthy diet. Meal planning includes avoidance of concentrated sweets. He has not had a previous visit with a dietician. He participates in exercise intermittently. There is no change in his home blood glucose trend. An ACE inhibitor/angiotensin II receptor blocker is not being taken. He does not see a podiatrist.Eye exam is current.      Review of Systems  Constitutional: Negative.  Negative for weight loss and fatigue.  HENT: Negative.   Eyes: Negative.  Negative for blurred vision.  Respiratory: Negative.  Negative for cough, choking, shortness of breath and stridor.   Cardiovascular: Negative.  Negative for chest pain, palpitations and leg swelling.  Gastrointestinal: Negative.  Negative for nausea, vomiting, abdominal pain, diarrhea, constipation and blood in stool.  Endocrine: Negative.  Negative for polydipsia, polyphagia and polyuria.  Genitourinary: Negative.   Musculoskeletal: Negative.   Skin: Negative.   Allergic/Immunologic: Negative.   Neurological: Negative.  Negative for weakness.  Hematological: Negative.   Psychiatric/Behavioral: Negative.        Objective:   Physical Exam  Vitals reviewed. Constitutional: He is oriented to person, place, and time. He appears well-developed and well-nourished. No  distress.  HENT:  Head: Normocephalic and atraumatic.  Mouth/Throat: Oropharynx is clear and moist. No oropharyngeal exudate.  Eyes: Conjunctivae are normal. Right eye exhibits no discharge. Left eye exhibits no discharge. No scleral icterus.  Neck: Normal range of motion. Neck supple. No JVD present. No tracheal deviation present. No thyromegaly present.  Cardiovascular: Normal rate, regular rhythm, normal heart sounds and intact distal pulses.  Exam reveals no gallop and no friction rub.   No murmur heard. Pulmonary/Chest: Effort normal and breath sounds normal. No stridor. No respiratory distress. He has no wheezes. He has no rales. He exhibits no tenderness.  Abdominal: Soft. Bowel sounds are normal. He exhibits no distension and no mass. There is no tenderness. There is no rebound and no guarding.  Musculoskeletal: Normal range of motion. He exhibits no edema and no tenderness.  Lymphadenopathy:    He has no cervical adenopathy.  Neurological: He is oriented to person, place, and time.  Skin: Skin is warm and dry. No rash noted. He is not diaphoretic. No erythema. No pallor.     Lab Results  Component Value Date   WBC 8.1 10/26/2013   HGB 15.9 10/26/2013   HCT 46.2 10/26/2013   PLT 227.0 10/26/2013   GLUCOSE 166* 10/26/2013   CHOL 131 03/02/2013   TRIG 105.0 03/02/2013   HDL 42.20 03/02/2013   LDLCALC 68 03/02/2013   ALT 27 03/02/2013   AST 23 03/02/2013   NA 138 10/26/2013   K 4.2 10/26/2013   CL 102 10/26/2013   CREATININE 1.0 10/26/2013   BUN 16 10/26/2013   CO2 28 10/26/2013   TSH 0.74 10/26/2013   PSA 3.14 10/26/2013   INR 1.08 03/25/2010   HGBA1C  7.8* 10/26/2013   MICROALBUR 0.2 03/02/2013       Assessment & Plan:

## 2014-01-30 NOTE — Assessment & Plan Note (Signed)
The A1C shows that his blood sugars are well controlled 

## 2014-01-30 NOTE — Assessment & Plan Note (Signed)
His LDL is excellent He does not want to take a statin

## 2014-01-30 NOTE — Progress Notes (Signed)
Pre visit review using our clinic review tool, if applicable. No additional management support is needed unless otherwise documented below in the visit note. 

## 2014-01-30 NOTE — Patient Instructions (Signed)
Type 2 Diabetes Mellitus, Adult Type 2 diabetes mellitus, often simply referred to as type 2 diabetes, is a long-lasting (chronic) disease. In type 2 diabetes, the pancreas does not make enough insulin (a hormone), the cells are less responsive to the insulin that is made (insulin resistance), or both. Normally, insulin moves sugars from food into the tissue cells. The tissue cells use the sugars for energy. The lack of insulin or the lack of normal response to insulin causes excess sugars to build up in the blood instead of going into the tissue cells. As a result, high blood sugar (hyperglycemia) develops. The effect of high sugar (glucose) levels can cause many complications. Type 2 diabetes was also previously called adult-onset diabetes but it can occur at any age.  RISK FACTORS  A person is predisposed to developing type 2 diabetes if someone in the family has the disease and also has one or more of the following primary risk factors:  Overweight.  An inactive lifestyle.  A history of consistently eating high-calorie foods. Maintaining a normal weight and regular physical activity can reduce the chance of developing type 2 diabetes. SYMPTOMS  A person with type 2 diabetes may not show symptoms initially. The symptoms of type 2 diabetes appear slowly. The symptoms include:  Increased thirst (polydipsia).  Increased urination (polyuria).  Increased urination during the night (nocturia).  Weight loss. This weight loss may be rapid.  Frequent, recurring infections.  Tiredness (fatigue).  Weakness.  Vision changes, such as blurred vision.  Fruity smell to your breath.  Abdominal pain.  Nausea or vomiting.  Cuts or bruises which are slow to heal.  Tingling or numbness in the hands or feet. DIAGNOSIS Type 2 diabetes is frequently not diagnosed until complications of diabetes are present. Type 2 diabetes is diagnosed when symptoms or complications are present and when blood  glucose levels are increased. Your blood glucose level may be checked by one or more of the following blood tests:  A fasting blood glucose test. You will not be allowed to eat for at least 8 hours before a blood sample is taken.  A random blood glucose test. Your blood glucose is checked at any time of the day regardless of when you ate.  A hemoglobin A1c blood glucose test. A hemoglobin A1c test provides information about blood glucose control over the previous 3 months.  An oral glucose tolerance test (OGTT). Your blood glucose is measured after you have not eaten (fasted) for 2 hours and then after you drink a glucose-containing beverage. TREATMENT   You may need to take insulin or diabetes medicine daily to keep blood glucose levels in the desired range.  You will need to match insulin dosing with exercise and healthy food choices. The treatment goal is to maintain the before meal blood sugar (preprandial glucose) level at 70 130 mg/dL. HOME CARE INSTRUCTIONS   Have your hemoglobin A1c level checked twice a year.  Perform daily blood glucose monitoring as directed by your caregiver.  Monitor urine ketones when you are ill and as directed by your caregiver.  Take your diabetes medicine or insulin as directed by your caregiver to maintain your blood glucose levels in the desired range.  Never run out of diabetes medicine or insulin. It is needed every day.  Adjust insulin based on your intake of carbohydrates. Carbohydrates can raise blood glucose levels but need to be included in your diet. Carbohydrates provide vitamins, minerals, and fiber which are an essential part of   a healthy diet. Carbohydrates are found in fruits, vegetables, whole grains, dairy products, legumes, and foods containing added sugars.    Eat healthy foods. Alternate 3 meals with 3 snacks.  Lose weight if overweight.  Carry a medical alert card or wear your medical alert jewelry.  Carry a 15 gram  carbohydrate snack with you at all times to treat low blood glucose (hypoglycemia). Some examples of 15 gram carbohydrate snacks include:  Glucose tablets, 3 or 4   Glucose gel, 15 gram tube  Raisins, 2 tablespoons (24 grams)  Jelly beans, 6  Animal crackers, 8  Regular pop, 4 ounces (120 mL)  Gummy treats, 9  Recognize hypoglycemia. Hypoglycemia occurs with blood glucose levels of 70 mg/dL and below. The risk for hypoglycemia increases when fasting or skipping meals, during or after intense exercise, and during sleep. Hypoglycemia symptoms can include:  Tremors or shakes.  Decreased ability to concentrate.  Sweating.  Increased heart rate.  Headache.  Dry mouth.  Hunger.  Irritability.  Anxiety.  Restless sleep.  Altered speech or coordination.  Confusion.  Treat hypoglycemia promptly. If you are alert and able to safely swallow, follow the 15:15 rule:  Take 15 20 grams of rapid-acting glucose or carbohydrate. Rapid-acting options include glucose gel, glucose tablets, or 4 ounces (120 mL) of fruit juice, regular soda, or low fat milk.  Check your blood glucose level 15 minutes after taking the glucose.  Take 15 20 grams more of glucose if the repeat blood glucose level is still 70 mg/dL or below.  Eat a meal or snack within 1 hour once blood glucose levels return to normal.    Be alert to polyuria and polydipsia which are early signs of hyperglycemia. An early awareness of hyperglycemia allows for prompt treatment. Treat hyperglycemia as directed by your caregiver.  Engage in at least 150 minutes of moderate-intensity physical activity a week, spread over at least 3 days of the week or as directed by your caregiver. In addition, you should engage in resistance exercise at least 2 times a week or as directed by your caregiver.  Adjust your medicine and food intake as needed if you start a new exercise or sport.  Follow your sick day plan at any time you  are unable to eat or drink as usual.  Avoid tobacco use.  Limit alcohol intake to no more than 1 drink per day for nonpregnant women and 2 drinks per day for men. You should drink alcohol only when you are also eating food. Talk with your caregiver whether alcohol is safe for you. Tell your caregiver if you drink alcohol several times a week.  Follow up with your caregiver regularly.  Schedule an eye exam soon after the diagnosis of type 2 diabetes and then annually.  Perform daily skin and foot care. Examine your skin and feet daily for cuts, bruises, redness, nail problems, bleeding, blisters, or sores. A foot exam by a caregiver should be done annually.  Brush your teeth and gums at least twice a day and floss at least once a day. Follow up with your dentist regularly.  Share your diabetes management plan with your workplace or school.  Stay up-to-date with immunizations.  Learn to manage stress.  Obtain ongoing diabetes education and support as needed.  Participate in, or seek rehabilitation as needed to maintain or improve independence and quality of life. Request a physical or occupational therapy referral if you are having foot or hand numbness or difficulties with grooming,   dressing, eating, or physical activity. SEEK MEDICAL CARE IF:   You are unable to eat food or drink fluids for more than 6 hours.  You have nausea and vomiting for more than 6 hours.  Your blood glucose level is over 240 mg/dL.  There is a change in mental status.  You develop an additional serious illness.  You have diarrhea for more than 6 hours.  You have been sick or have had a fever for a couple of days and are not getting better.  You have pain during any physical activity.  SEEK IMMEDIATE MEDICAL CARE IF:  You have difficulty breathing.  You have moderate to large ketone levels. MAKE SURE YOU:  Understand these instructions.  Will watch your condition.  Will get help right away if  you are not doing well or get worse. Document Released: 08/04/2005 Document Revised: 04/28/2012 Document Reviewed: 03/02/2012 [employer] Patient Information 2014 [employer].  

## 2014-01-31 LAB — C-PEPTIDE: C PEPTIDE: 2.38 ng/mL (ref 0.80–3.90)

## 2014-03-28 ENCOUNTER — Ambulatory Visit (INDEPENDENT_AMBULATORY_CARE_PROVIDER_SITE_OTHER): Payer: 59 | Admitting: Ophthalmology

## 2014-04-13 ENCOUNTER — Encounter: Payer: Self-pay | Admitting: Internal Medicine

## 2014-04-14 ENCOUNTER — Other Ambulatory Visit: Payer: Self-pay | Admitting: Internal Medicine

## 2014-04-14 DIAGNOSIS — IMO0001 Reserved for inherently not codable concepts without codable children: Secondary | ICD-10-CM

## 2014-04-14 DIAGNOSIS — E1165 Type 2 diabetes mellitus with hyperglycemia: Principal | ICD-10-CM

## 2014-04-14 MED ORDER — METFORMIN HCL 1000 MG PO TABS: 1000.0000 mg | ORAL_TABLET | Freq: Two times a day (BID) | ORAL | Status: DC

## 2014-06-22 ENCOUNTER — Telehealth: Payer: Self-pay | Admitting: *Deleted

## 2014-06-22 DIAGNOSIS — J301 Allergic rhinitis due to pollen: Secondary | ICD-10-CM

## 2014-06-22 DIAGNOSIS — J309 Allergic rhinitis, unspecified: Secondary | ICD-10-CM

## 2014-06-22 DIAGNOSIS — J3089 Other allergic rhinitis: Secondary | ICD-10-CM

## 2014-06-22 MED ORDER — CETIRIZINE-PSEUDOEPHEDRINE ER 5-120 MG PO TB12
1.0000 | ORAL_TABLET | Freq: Two times a day (BID) | ORAL | Status: DC
Start: 2014-06-22 — End: 2014-06-27

## 2014-06-22 NOTE — Telephone Encounter (Signed)
Please advise refill for Allergy-D? Needs Dx code changed.

## 2014-06-25 ENCOUNTER — Encounter: Payer: Self-pay | Admitting: Internal Medicine

## 2014-06-26 ENCOUNTER — Telehealth: Payer: Self-pay | Admitting: *Deleted

## 2014-06-26 DIAGNOSIS — J309 Allergic rhinitis, unspecified: Secondary | ICD-10-CM

## 2014-06-26 DIAGNOSIS — J3089 Other allergic rhinitis: Secondary | ICD-10-CM

## 2014-06-26 DIAGNOSIS — J301 Allergic rhinitis due to pollen: Secondary | ICD-10-CM

## 2014-06-26 NOTE — Telephone Encounter (Signed)
From: Nathan Park   Sent: 06/25/2014 11:12 AM    To: Nathan Park Clinical Pool  Subject: Non-Urgent Medical Question                            Please send an approval to refill my prescription [facility]tec D [facility] [address] Since Zyrtec D is an "over the counter" medication my mail order pharmacy "[facility]" will NOT fill it and send it to me. The reason I need a prescription is so that I can obtain multiple month supply. Without a prescription [facility] will only allow me 1 month supply at a time per [place] regulations.

## 2014-06-26 NOTE — Telephone Encounter (Signed)
Rx has already bee sent to pharmacy.

## 2014-06-27 ENCOUNTER — Other Ambulatory Visit: Payer: Self-pay

## 2014-06-27 DIAGNOSIS — J301 Allergic rhinitis due to pollen: Secondary | ICD-10-CM

## 2014-06-27 DIAGNOSIS — J3089 Other allergic rhinitis: Secondary | ICD-10-CM

## 2014-06-27 DIAGNOSIS — J309 Allergic rhinitis, unspecified: Secondary | ICD-10-CM

## 2014-06-27 MED ORDER — CETIRIZINE-PSEUDOEPHEDRINE ER 5-120 MG PO TB12: 1.0000 | ORAL_TABLET | Freq: Two times a day (BID) | ORAL | Status: DC

## 2014-11-30 ENCOUNTER — Encounter: Payer: Self-pay | Admitting: Internal Medicine

## 2014-12-21 ENCOUNTER — Other Ambulatory Visit: Payer: Self-pay | Admitting: Internal Medicine

## 2014-12-29 ENCOUNTER — Telehealth: Payer: Self-pay

## 2014-12-29 MED ORDER — SUMATRIPTAN SUCCINATE 100 MG PO TABS: 100.0000 mg | ORAL_TABLET | ORAL | Status: DC | PRN

## 2014-12-29 NOTE — Telephone Encounter (Signed)
[facility] im[facility]

## 2015-01-02 ENCOUNTER — Other Ambulatory Visit: Payer: Self-pay | Admitting: Internal Medicine

## 2015-01-02 DIAGNOSIS — G43019 Migraine without aura, intractable, without status migrainosus: Secondary | ICD-10-CM

## 2015-01-02 MED ORDER — SUMATRIPTAN SUCCINATE 100 MG PO TABS
100.0000 mg | ORAL_TABLET | ORAL | Status: DC | PRN
Start: 2015-01-02 — End: 2016-03-25

## 2015-01-08 ENCOUNTER — Ambulatory Visit (INDEPENDENT_AMBULATORY_CARE_PROVIDER_SITE_OTHER): Payer: 59 | Admitting: Internal Medicine

## 2015-01-08 ENCOUNTER — Encounter: Payer: Self-pay | Admitting: Internal Medicine

## 2015-01-08 ENCOUNTER — Other Ambulatory Visit (INDEPENDENT_AMBULATORY_CARE_PROVIDER_SITE_OTHER): Payer: 59

## 2015-01-08 VITALS — BP 122/84 | HR 89 | Temp 97.6°F | Ht 66.0 in | Wt 181.0 lb

## 2015-01-08 DIAGNOSIS — J324 Chronic pansinusitis: Secondary | ICD-10-CM | POA: Diagnosis not present

## 2015-01-08 DIAGNOSIS — E785 Hyperlipidemia, unspecified: Secondary | ICD-10-CM

## 2015-01-08 DIAGNOSIS — E118 Type 2 diabetes mellitus with unspecified complications: Secondary | ICD-10-CM | POA: Diagnosis not present

## 2015-01-08 LAB — CBC WITH DIFFERENTIAL/PLATELET
BASOS ABS: 0 10*3/uL (ref 0.0–0.1)
Basophils Relative: 0.5 % (ref 0.0–3.0)
EOS ABS: 0.3 10*3/uL (ref 0.0–0.7)
Eosinophils Relative: 5.7 % — ABNORMAL HIGH (ref 0.0–5.0)
HCT: 43.7 % (ref 39.0–52.0)
HEMOGLOBIN: 15.1 g/dL (ref 13.0–17.0)
Lymphocytes Relative: 29.9 % (ref 12.0–46.0)
Lymphs Abs: 1.6 10*3/uL (ref 0.7–4.0)
MCHC: 34.6 g/dL (ref 30.0–36.0)
MCV: 86.5 fl (ref 78.0–100.0)
Monocytes Absolute: 0.5 10*3/uL (ref 0.1–1.0)
Monocytes Relative: 9.3 % (ref 3.0–12.0)
NEUTROS ABS: 3 10*3/uL (ref 1.4–7.7)
Neutrophils Relative %: 54.6 % (ref 43.0–77.0)
PLATELETS: 208 10*3/uL (ref 150.0–400.0)
RBC: 5.06 Mil/uL (ref 4.22–5.81)
RDW: 13.3 % (ref 11.5–15.5)
WBC: 5.4 10*3/uL (ref 4.0–10.5)

## 2015-01-08 LAB — COMPREHENSIVE METABOLIC PANEL
ALBUMIN: 4.5 g/dL (ref 3.5–5.2)
ALT: 19 U/L (ref 0–53)
AST: 16 U/L (ref 0–37)
Alkaline Phosphatase: 41 U/L (ref 39–117)
BILIRUBIN TOTAL: 0.5 mg/dL (ref 0.2–1.2)
BUN: 17 mg/dL (ref 6–23)
CO2: 29 mEq/L (ref 19–32)
CREATININE: 0.99 mg/dL (ref 0.40–1.50)
Calcium: 9 mg/dL (ref 8.4–10.5)
Chloride: 103 mEq/L (ref 96–112)
GFR: 84.73 mL/min (ref >60.00)
Glucose, Bld: 115 mg/dL — ABNORMAL HIGH (ref 70–99)
Potassium: 4.2 mEq/L (ref 3.5–5.1)
SODIUM: 137 meq/L (ref 135–145)
TOTAL PROTEIN: 6.9 g/dL (ref 6.0–8.3)

## 2015-01-08 LAB — URINALYSIS, ROUTINE W REFLEX MICROSCOPIC
BILIRUBIN URINE: NEGATIVE
Ketones, ur: NEGATIVE
LEUKOCYTES UA: NEGATIVE
Nitrite: NEGATIVE
Specific Gravity, Urine: 1.02 (ref 1.000–1.030)
Total Protein, Urine: NEGATIVE
URINE GLUCOSE: NEGATIVE
Urobilinogen, UA: 0.2 (ref 0.0–1.0)
pH: 7 (ref 5.0–8.0)

## 2015-01-08 LAB — MICROALBUMIN / CREATININE URINE RATIO
CREATININE, U: 126.2 mg/dL
Microalb Creat Ratio: 0.6 mg/g (ref 0.0–30.0)
Microalb, Ur: 0.8 mg/dL (ref 0.0–1.9)

## 2015-01-08 LAB — LIPID PANEL
CHOLESTEROL: 136 mg/dL (ref 0–200)
HDL: 43.1 mg/dL (ref >39.00)
LDL Cholesterol: 75 mg/dL (ref 0–99)
NonHDL: 92.9
TRIGLYCERIDES: 88 mg/dL (ref 0.0–149.0)
Total CHOL/HDL Ratio: 3
VLDL: 17.6 mg/dL (ref 0.0–40.0)

## 2015-01-08 LAB — TSH: TSH: 0.93 u[IU]/mL (ref 0.35–4.50)

## 2015-01-08 LAB — HEMOGLOBIN A1C: Hgb A1c MFr Bld: 6.3 % (ref 4.6–6.5)

## 2015-01-08 MED ORDER — METFORMIN HCL ER (OSM) 1000 MG PO TB24: 1000.0000 mg | ORAL_TABLET | Freq: Every day | ORAL | Status: DC

## 2015-01-08 NOTE — Progress Notes (Signed)
Subjective:  Patient ID: Nathan Park, male    DOB: [birth year]  Age: 49 y.o. MRN: [id number]  CC: Diabetes   HPI Nathan Park presents for f/up on DM2 - he complains of loose and frequent stools but otherwise he feels well.  Outpatient Prescriptions Prior to Visit  Medication Sig Dispense Refill  . acetaminophen (TYLENOL) 500 MG tablet Take 500-1,000 mg by mouth every 6 (six) hours as needed. For pain    . cetirizine-pseudoephedrine (ZYRTEC-D ALLERGY & CONGESTION) 5-120 MG per tablet Take 1 tablet by mouth 2 (two) times daily. 180 tablet 3  . glucose blood (ONE TOUCH ULTRA TEST) test strip Use as instructed 100 each 12  . ONETOUCH DELICA LANCETS 25Q MISC Test up to BID 200 each 4  . SUMAtriptan (IMITREX) 100 MG tablet Take 1 tablet (100 mg total) by mouth every 2 (two) hours as needed for migraine or headache. 90 tablet 3  . metFORMIN (GLUCOPHAGE) 1000 MG tablet TAKE 1 TABLET TWICE A DAY WITH MEALS 180 tablet 0  . Blood Glucose Monitoring Suppl (ONE TOUCH ULTRA SYSTEM KIT) W/DEVICE KIT 1 kit by Does not apply route once. 1 each 0  . fluticasone (FLONASE) 50 MCG/ACT nasal spray Place 2 sprays into both nostrils daily. 48 g 4  . Multiple Vitamin (MULTIVITAMIN WITH MINERALS) TABS Take 1 tablet by mouth daily.    . sildenafil (VIAGRA) 50 MG tablet Take 1 tablet (50 mg total) by mouth daily as needed for erectile dysfunction. 12 tablet 0   No facility-administered medications prior to visit.    ROS Review of Systems  Constitutional: Negative.  Negative for fever, chills, diaphoresis, appetite change and fatigue.  HENT: Positive for postnasal drip and rhinorrhea. Negative for congestion, nosebleeds, sinus pressure, sneezing, sore throat, tinnitus, trouble swallowing and voice change.   Eyes: Negative.   Respiratory: Negative.  Negative for cough, choking, chest tightness, shortness of breath and stridor.   Cardiovascular: Negative.  Negative for chest pain, palpitations and leg swelling.    Gastrointestinal: Positive for diarrhea. Negative for nausea, vomiting, abdominal pain, constipation, blood in stool, anal bleeding and rectal pain.  Endocrine: Negative.   Genitourinary: Negative.   Musculoskeletal: Negative.  Negative for myalgias, back pain, joint swelling and arthralgias.  Skin: Negative.  Negative for rash.  Allergic/Immunologic: Negative.   Neurological: Negative.   Hematological: Negative.  Negative for adenopathy. Does not bruise/bleed easily.  Psychiatric/Behavioral: Negative.     Objective:  BP 122/84 mmHg  Pulse 89  Temp(Src) 97.6 F (36.4 C) (Oral)  Ht _0  (1.676 m)  Wt 181 lb (82.101 kg)  BMI 29.23 kg/m2  SpO2 98%  BP Readings from Last 3 Encounters:  01/08/15 122/84  01/30/14 104/70  10/27/13 110/80    Wt Readings from Last 3 Encounters:  01/08/15 181 lb (82.101 kg)  01/30/14 184 lb (83.462 kg)  10/27/13 188 lb 12.8 oz (85.639 kg)    Physical Exam  Constitutional: He is oriented to person, place, and time. He appears well-developed and well-nourished.  Non-toxic appearance. He does not have a sickly appearance. He does not appear ill. No distress.  HENT:  Head: Normocephalic and atraumatic.  Mouth/Throat: Oropharynx is clear and moist. No oropharyngeal exudate.  Eyes: Conjunctivae are normal. Right eye exhibits no discharge. Left eye exhibits no discharge. No scleral icterus.  Neck: Normal range of motion. Neck supple. No JVD present. No tracheal deviation present. No thyromegaly present.  Cardiovascular: Normal rate, regular rhythm, normal heart sounds and  intact distal pulses.  Exam reveals no gallop and no friction rub.   No murmur heard. Pulmonary/Chest: Effort normal and breath sounds normal. No stridor. No respiratory distress. He has no wheezes. He has no rales. He exhibits no tenderness.  Abdominal: Soft. Bowel sounds are normal. He exhibits no distension and no mass. There is no tenderness. There is no rebound and no guarding.   Musculoskeletal: Normal range of motion. He exhibits no edema or tenderness.  Lymphadenopathy:    He has no cervical adenopathy.  Neurological: He is oriented to person, place, and time.  Skin: Skin is warm and dry. No rash noted. He is not diaphoretic. No erythema. No pallor.  Vitals reviewed.   Lab Results  Component Value Date   WBC 5.4 01/08/2015   HGB 15.1 01/08/2015   HCT 43.7 01/08/2015   PLT 208.0 01/08/2015   GLUCOSE 115* 01/08/2015   CHOL 136 01/08/2015   TRIG 88.0 01/08/2015   HDL 43.10 01/08/2015   LDLCALC 75 01/08/2015   ALT 19 01/08/2015   AST 16 01/08/2015   NA 137 01/08/2015   K 4.2 01/08/2015   CL 103 01/08/2015   CREATININE 0.99 01/08/2015   BUN 17 01/08/2015   CO2 29 01/08/2015   TSH 0.93 01/08/2015   PSA 3.14 10/26/2013   INR 1.08 03/25/2010   HGBA1C 6.3 01/08/2015   MICROALBUR 0.8 01/08/2015    Dg Chest 2 View  08/17/2012   *RADIOLOGY REPORT*  Clinical Data: Preoperative evaluation for Nissen fundoplication. Diabetes.  Nonsmoker.  No current chest complaints  CHEST - 2 VIEW  Comparison: 03/25/2010  Findings: Heart and mediastinal contours are within normal limits. The lung fields appear clear with no signs of focal infiltrate or congestive failure.  No pleural fluid or significant peribronchial cuffing is seen.  Bony structures appear intact.  IMPRESSION: Stable cardiopulmonary appearance with no new focal or acute abnormality identified.   Original Report Authenticated By: Ponciano Ort, M.D.    Assessment & Plan:   Nathan Park was seen today for diabetes.  Diagnoses and all orders for this visit:  Type II diabetes mellitus with manifestations - his A1C is down to 6.3% and he complains of loose BM's so will lower the metformin dose to 1000 mcg QD and will change to an XR formulation. Orders: -     Lipid panel; Future -     Comprehensive metabolic panel; Future -     CBC with Differential/Platelet; Future -     Microalbumin / creatinine urine ratio;  Future -     Hemoglobin A1c; Future -     Urinalysis, Routine w reflex microscopic; Future -     Ambulatory referral to Ophthalmology -     metformin (FORTAMET) 1000 MG (OSM) 24 hr tablet; Take 1 tablet (1,000 mg total) by mouth daily with breakfast.  Hyperlipidemia with target LDL less than 100 - he has achieved his LDL goal Orders: -     Lipid panel; Future -     Comprehensive metabolic panel; Future -     CBC with Differential/Platelet; Future -     TSH; Future  Chronic pansinusitis he wants to see ENT about this Orders: -     Ambulatory referral to ENT  I have discontinued Nathan Park multivitamin with minerals, sildenafil, ONE TOUCH ULTRA SYSTEM KIT, fluticasone, and metFORMIN. I am also having him start on metformin. Additionally, I am having him maintain his acetaminophen, glucose blood, ONETOUCH DELICA LANCETS 67E, cetirizine-pseudoephedrine, and SUMAtriptan.  Meds  ordered this encounter  Medications  . metformin (FORTAMET) 1000 MG (OSM) 24 hr tablet    Sig: Take 1 tablet (1,000 mg total) by mouth daily with breakfast.    Dispense:  90 tablet    Refill:  1     Follow-up: Return in about 4 months (around 05/11/2015).  Scarlette Calico, MD

## 2015-01-31 ENCOUNTER — Encounter (INDEPENDENT_AMBULATORY_CARE_PROVIDER_SITE_OTHER): Payer: Self-pay | Admitting: Ophthalmology

## 2015-03-02 LAB — HM DIABETES EYE EXAM

## 2015-03-08 ENCOUNTER — Encounter: Payer: Self-pay | Admitting: Internal Medicine

## 2015-08-06 ENCOUNTER — Encounter: Payer: Self-pay | Admitting: Internal Medicine

## 2015-08-06 ENCOUNTER — Other Ambulatory Visit: Payer: Self-pay | Admitting: Internal Medicine

## 2015-10-08 ENCOUNTER — Other Ambulatory Visit: Payer: Self-pay | Admitting: Internal Medicine

## 2015-10-08 NOTE — Telephone Encounter (Signed)
Last OV that could address the need for this med was march/2015---please advise, thanks

## 2016-01-05 ENCOUNTER — Other Ambulatory Visit: Payer: Self-pay | Admitting: Internal Medicine

## 2016-01-30 LAB — HM DIABETES EYE EXAM

## 2016-03-25 ENCOUNTER — Encounter: Payer: Self-pay | Admitting: Internal Medicine

## 2016-03-25 ENCOUNTER — Ambulatory Visit (INDEPENDENT_AMBULATORY_CARE_PROVIDER_SITE_OTHER): Admitting: Internal Medicine

## 2016-03-25 VITALS — BP 126/90 | HR 76 | Temp 98.3°F | Resp 16 | Ht 66.0 in | Wt 187.0 lb

## 2016-03-25 DIAGNOSIS — K4000 Bilateral inguinal hernia, with obstruction, without gangrene, not specified as recurrent: Secondary | ICD-10-CM | POA: Diagnosis not present

## 2016-03-25 DIAGNOSIS — K469 Unspecified abdominal hernia without obstruction or gangrene: Secondary | ICD-10-CM | POA: Insufficient documentation

## 2016-03-25 NOTE — Progress Notes (Signed)
Subjective:  Patient ID: Nathan Park, male    DOB: [birth year]  Age: 50 y.o. MRN: [id number]  CC: Inguinal Hernia   HPI Nathan Park presents for concerns about a 5 day hx of painless bulge in his right groin after doing some heavy lifting.  Outpatient Medications Prior to Visit  Medication Sig Dispense Refill  . acetaminophen (TYLENOL) 500 MG tablet Take 500-1,000 mg by mouth every 6 (six) hours as needed. For pain    . ALL DAY ALLERGY-D 5-120 MG tablet TAKE ONE TABLET BY MOUTH TWICE DAILY 180 tablet 3  . glucose blood (ONE TOUCH ULTRA TEST) test strip Use as instructed 100 each 12  . metformin (FORTAMET) 1000 MG (OSM) 24 hr tablet Take 1 tablet (1,000 mg total) by mouth daily with breakfast. Yearly physical w/labs are due must see MD for refills 90 tablet 0  . ONETOUCH DELICA LANCETS 33G MISC Test up to BID 200 each 4  . SUMAtriptan (IMITREX) 100 MG tablet Take 1 tablet (100 mg total) by mouth every 2 (two) hours as needed for migraine or headache. 90 tablet 3   No facility-administered medications prior to visit.     ROS Review of Systems  Constitutional: Negative.  Negative for chills and fever.  HENT: Negative.   Eyes: Negative.   Respiratory: Negative.  Negative for cough.   Cardiovascular: Negative.  Negative for chest pain.  Gastrointestinal: Negative.  Negative for abdominal pain, blood in stool, constipation, diarrhea, nausea and vomiting.  Endocrine: Negative.   Genitourinary: Negative for difficulty urinating, dysuria, flank pain, scrotal swelling, testicular pain and urgency.  Musculoskeletal: Negative.  Negative for back pain.  Skin: Negative.  Negative for rash.  Allergic/Immunologic: Negative.   Neurological: Negative.   Hematological: Negative.  Negative for adenopathy. Does not bruise/bleed easily.  Psychiatric/Behavioral: Negative.     Objective:  BP 126/90 (BP Location: Left Arm, Patient Position: Sitting, Cuff Size: Normal)   Pulse 76   Temp 98.3 F  (36.8 C) (Oral)   Resp 16   Ht 5\' 6"  (1.676 m)   Wt 187 lb (84.8 kg)   SpO2 98%   BMI 30.18 kg/m   BP Readings from Last 3 Encounters:  03/25/16 126/90  01/08/15 122/84  01/30/14 104/70    Wt Readings from Last 3 Encounters:  03/25/16 187 lb (84.8 kg)  01/08/15 181 lb (82.1 kg)  01/30/14 184 lb (83.5 kg)    Physical Exam  Constitutional: No distress.  HENT:  Mouth/Throat: Oropharynx is clear and moist. No oropharyngeal exudate.  Eyes: Conjunctivae are normal. Right eye exhibits no discharge. Left eye exhibits no discharge. No scleral icterus.  Neck: Normal range of motion. Neck supple. No JVD present. No tracheal deviation present. No thyromegaly present.  Cardiovascular: Normal rate, regular rhythm, normal heart sounds and intact distal pulses.  Exam reveals no gallop and no friction rub.   No murmur heard. Pulmonary/Chest: Effort normal and breath sounds normal. No stridor. No respiratory distress. He has no wheezes. He has no rales. He exhibits no tenderness.  Abdominal: Soft. Bowel sounds are normal. He exhibits no distension and no mass. There is no tenderness. There is no rebound and no guarding. A hernia is present. Hernia confirmed positive in the right inguinal area and confirmed positive in the left inguinal area.  Genitourinary: Testes normal and penis normal. Right testis shows no mass and no swelling. Right testis is descended. Left testis shows no mass, no swelling and no tenderness. Left testis  is descended. Circumcised. No penile erythema or penile tenderness. No discharge found.  Genitourinary Comments: There are bilateral indirect inguinal hernias that are only felt with Valsalva, they quickly and spontaneously reduce.  Lymphadenopathy:    He has no cervical adenopathy.       Right: No inguinal adenopathy present.       Left: No inguinal adenopathy present.  Skin: He is not diaphoretic.  Vitals reviewed.   Lab Results  Component Value Date   WBC 5.4  01/08/2015   HGB 15.1 01/08/2015   HCT 43.7 01/08/2015   PLT 208.0 01/08/2015   GLUCOSE 115 (H) 01/08/2015   CHOL 136 01/08/2015   TRIG 88.0 01/08/2015   HDL 43.10 01/08/2015   LDLCALC 75 01/08/2015   ALT 19 01/08/2015   AST 16 01/08/2015   NA 137 01/08/2015   K 4.2 01/08/2015   CL 103 01/08/2015   CREATININE 0.99 01/08/2015   BUN 17 01/08/2015   CO2 29 01/08/2015   TSH 0.93 01/08/2015   PSA 3.14 10/26/2013   INR 1.08 03/25/2010   HGBA1C 6.3 01/08/2015   MICROALBUR 0.8 01/08/2015    Dg Chest 2 View  Result Date: 08/17/2012 *RADIOLOGY REPORT* Clinical Data: Preoperative evaluation for Nissen fundoplication. Diabetes.  Nonsmoker.  No current chest complaints CHEST - 2 VIEW Comparison: 03/25/2010 Findings: Heart and mediastinal contours are within normal limits. The lung fields appear clear with no signs of focal infiltrate or congestive failure.  No pleural fluid or significant peribronchial cuffing is seen. Bony structures appear intact. IMPRESSION: Stable cardiopulmonary appearance with no new focal or acute abnormality identified. Original Report Authenticated By: Rhodia Albright, M.D.    Assessment & Plan:   Nathan Park was seen today for inguinal hernia.  Diagnoses and all orders for this visit:  Bilateral inguinal hernia with obstruction and without gangrene, recurrence not specified -     Ambulatory referral to General Surgery   I have discontinued Nathan Park's SUMAtriptan. I am also having him maintain his acetaminophen, glucose blood, ONETOUCH DELICA LANCETS 33G, ALL DAY ALLERGY-D, and metformin.  No orders of the defined types were placed in this encounter.    Follow-up: Return if symptoms worsen or fail to improve.  Sanda Linger, MD

## 2016-03-25 NOTE — Patient Instructions (Signed)

## 2016-03-25 NOTE — Progress Notes (Signed)
Pre visit review using our clinic review tool, if applicable. No additional management support is needed unless otherwise documented below in the visit note. 

## 2016-03-28 ENCOUNTER — Other Ambulatory Visit: Payer: Self-pay | Admitting: Internal Medicine

## 2016-03-28 ENCOUNTER — Telehealth: Payer: Self-pay

## 2016-03-28 MED ORDER — PROMETHAZINE HCL 12.5 MG PO TABS: 12.5000 mg | ORAL_TABLET | Freq: Four times a day (QID) | ORAL | 0 refills | Status: DC | PRN

## 2016-03-28 NOTE — Telephone Encounter (Addendum)
PCP returned form. Pt informed fax has been sent. Copy mailed to pt.   Paperwork has been also been mailed to employer and sent to scan.

## 2016-03-28 NOTE — Telephone Encounter (Signed)
Patient is requesting an rx for phenergan in the event he becomes nauseous. He stated #15 would last a long time. The request is due to finding of EDG patient had done.  Please advise.

## 2016-03-28 NOTE — Telephone Encounter (Signed)
I have not received any paperwork regarding this patient. Stef - are you aware of any paperwork?

## 2016-03-28 NOTE — Telephone Encounter (Signed)
RX sent

## 2016-03-28 NOTE — Telephone Encounter (Signed)
I have not seen any paper work for this patient. Forwarding to PCP.   Also, LVM for pt to call back as soon as possible.

## 2016-03-28 NOTE — Telephone Encounter (Signed)
Informed rx has been sent.  

## 2016-03-28 NOTE — Telephone Encounter (Signed)
Patient called back. Pt informed that front office staff scanned documents into the chart and that he was not aware of any forms that needed to be scanned until after the appt. I have the forms printed and completed with all information that I am able to fill out.   Leaving for PCP to sign.

## 2016-03-28 NOTE — Telephone Encounter (Signed)
Patient was hurt at work and came in for an office visit. He states he needs give us the paper work. He has not heard anything yet about where this paperwork is. HE states he needs it to be faxed over to the place dealing with all this. [phone] ATTN : CAROL D.

## 2016-04-06 ENCOUNTER — Other Ambulatory Visit: Payer: Self-pay | Admitting: Internal Medicine

## 2016-04-17 ENCOUNTER — Ambulatory Visit (INDEPENDENT_AMBULATORY_CARE_PROVIDER_SITE_OTHER): Payer: 59 | Admitting: Internal Medicine

## 2016-04-17 ENCOUNTER — Encounter: Payer: Self-pay | Admitting: Internal Medicine

## 2016-04-17 VITALS — BP 140/90 | HR 91 | Temp 97.9°F | Resp 16 | Ht 66.0 in | Wt 184.0 lb

## 2016-04-17 DIAGNOSIS — E118 Type 2 diabetes mellitus with unspecified complications: Secondary | ICD-10-CM

## 2016-04-17 DIAGNOSIS — Z794 Long term (current) use of insulin: Secondary | ICD-10-CM

## 2016-04-17 DIAGNOSIS — Z23 Encounter for immunization: Secondary | ICD-10-CM | POA: Diagnosis not present

## 2016-04-17 DIAGNOSIS — Z0000 Encounter for general adult medical examination without abnormal findings: Secondary | ICD-10-CM

## 2016-04-17 NOTE — Patient Instructions (Addendum)

## 2016-04-17 NOTE — Progress Notes (Signed)
Subjective:  Patient ID: Nathan Park, male    DOB: 23-Apr-1966  Age: 50 y.o. MRN: [id number]  CC: Annual Exam and Diabetes   HPI Nathan Park presents for a CPX.  He tells me his blood sugars been well controlled, he has had no recent episodes of polyuria/polydipsia/or polyphagia. He has had no recent changes in his visual acuity. He offers no symptoms today. He sees general surgery soon regarding the inguinal hernias.  Outpatient Medications Prior to Visit  Medication Sig Dispense Refill  . acetaminophen (TYLENOL) 500 MG tablet Take 500-1,000 mg by mouth every 6 (six) hours as needed. For pain    . ALL DAY ALLERGY-D 5-120 MG tablet TAKE ONE TABLET BY MOUTH TWICE DAILY 180 tablet 3  . glucose blood (ONE TOUCH ULTRA TEST) test strip Use as instructed 100 each 12  . metformin (FORTAMET) 1000 MG (OSM) 24 hr tablet TAKE 1 TABLET DAILY WITH BREAKFAST (YEARLY PHYSICAL WITH LABS ARE DUE. MUST SEE DOCTOR FOR REFILLS) 90 tablet 0  . ONETOUCH DELICA LANCETS 33G MISC Test up to BID 200 each 4  . promethazine (PHENERGAN) 12.5 MG tablet Take 1 tablet (12.5 mg total) by mouth every 6 (six) hours as needed for nausea or vomiting. (Patient not taking: Reported on 04/17/2016) 20 tablet 0   No facility-administered medications prior to visit.     ROS Review of Systems  Constitutional: Negative.  Negative for activity change, chills, diaphoresis, fatigue and unexpected weight change.  HENT: Negative.   Eyes: Negative.  Negative for visual disturbance.  Respiratory: Negative for cough, choking, chest tightness, shortness of breath and stridor.   Cardiovascular: Negative.  Negative for chest pain, palpitations and leg swelling.  Gastrointestinal: Negative.  Negative for abdominal pain, constipation, diarrhea, nausea and vomiting.  Endocrine: Negative.  Negative for polydipsia, polyphagia and polyuria.  Genitourinary: Negative.  Negative for difficulty urinating, dysuria, frequency, penile pain, penile  swelling, scrotal swelling, testicular pain and urgency.  Musculoskeletal: Negative.  Negative for arthralgias, back pain, joint swelling, myalgias and neck pain.  Skin: Negative.   Allergic/Immunologic: Negative.   Neurological: Negative.  Negative for dizziness, tremors, weakness, light-headedness, numbness and headaches.  Hematological: Negative.  Negative for adenopathy. Does not bruise/bleed easily.  Psychiatric/Behavioral: Negative.     Objective:  BP 140/90 (BP Location: Left Arm, Patient Position: Sitting, Cuff Size: Normal)   Pulse 91   Temp 97.9 F (36.6 C) (Oral)   Resp 16   Ht 5\' 6"  (1.676 m)   Wt 184 lb (83.5 kg)   SpO2 97%   BMI 29.70 kg/m   BP Readings from Last 3 Encounters:  04/17/16 140/90  03/25/16 126/90  01/08/15 122/84    Wt Readings from Last 3 Encounters:  04/17/16 184 lb (83.5 kg)  03/25/16 187 lb (84.8 kg)  01/08/15 181 lb (82.1 kg)    Physical Exam  Constitutional: He is oriented to person, place, and time. No distress.  HENT:  Mouth/Throat: Oropharynx is clear and moist. No oropharyngeal exudate.  Eyes: Conjunctivae are normal. Right eye exhibits no discharge. Left eye exhibits no discharge. No scleral icterus.  Neck: Normal range of motion. Neck supple. No JVD present. No tracheal deviation present. No thyromegaly present.  Cardiovascular: Normal rate, regular rhythm, normal heart sounds and intact distal pulses.  Exam reveals no gallop and no friction rub.   No murmur heard. Pulmonary/Chest: Effort normal and breath sounds normal. No stridor. No respiratory distress. He has no wheezes. He has no rales.  He exhibits no tenderness.  Abdominal: Soft. Bowel sounds are normal. He exhibits no distension and no mass. There is no tenderness. There is no rebound and no guarding. Hernia confirmed negative in the right inguinal area and confirmed negative in the left inguinal area.  Genitourinary: Rectum normal, prostate normal, testes normal and penis  normal. Rectal exam shows no external hemorrhoid, no internal hemorrhoid, no fissure, no mass, no tenderness, anal tone normal and guaiac negative stool. Prostate is not enlarged and not tender. Right testis shows no mass, no swelling and no tenderness. Right testis is descended. Left testis shows no mass, no swelling and no tenderness. Left testis is descended. Circumcised. No penile erythema or penile tenderness. No discharge found.  Musculoskeletal: Normal range of motion. He exhibits no edema, tenderness or deformity.  Lymphadenopathy:    He has no cervical adenopathy.       Right: No inguinal adenopathy present.       Left: No inguinal adenopathy present.  Neurological: He is oriented to person, place, and time.  Skin: Skin is warm and dry. No rash noted. He is not diaphoretic. No erythema. No pallor.  Psychiatric: He has a normal mood and affect. His behavior is normal. Judgment normal.  Vitals reviewed.   Lab Results  Component Value Date   WBC 5.4 01/08/2015   HGB 15.1 01/08/2015   HCT 43.7 01/08/2015   PLT 208.0 01/08/2015   GLUCOSE 115 (H) 01/08/2015   CHOL 136 01/08/2015   TRIG 88.0 01/08/2015   HDL 43.10 01/08/2015   LDLCALC 75 01/08/2015   ALT 19 01/08/2015   AST 16 01/08/2015   NA 137 01/08/2015   K 4.2 01/08/2015   CL 103 01/08/2015   CREATININE 0.99 01/08/2015   BUN 17 01/08/2015   CO2 29 01/08/2015   TSH 0.93 01/08/2015   PSA 3.14 10/26/2013   INR 1.08 03/25/2010   HGBA1C 6.3 01/08/2015   MICROALBUR 0.8 01/08/2015    Dg Chest 2 View  Result Date: 08/17/2012 *RADIOLOGY REPORT* Clinical Data: Preoperative evaluation for Nissen fundoplication. Diabetes.  Nonsmoker.  No current chest complaints CHEST - 2 VIEW Comparison: 03/25/2010 Findings: Heart and mediastinal contours are within normal limits. The lung fields appear clear with no signs of focal infiltrate or congestive failure.  No pleural fluid or significant peribronchial cuffing is seen. Bony structures  appear intact. IMPRESSION: Stable cardiopulmonary appearance with no new focal or acute abnormality identified. Original Report Authenticated By: Rhodia Albright, M.D.    Assessment & Plan:   Nathan Park was seen today for annual exam and diabetes.  Diagnoses and all orders for this visit:  Need for prophylactic vaccination and inoculation against influenza -     Flu Vaccine QUAD 36+ mos IM  Type 2 diabetes mellitus with complication, with long-term current use of insulin (HCC)- will continue metformin for now, he is due for an A1c check and will monitor his renal function. If his blood sugars are elevated then will consider additional pharmacological intervention to control his blood sugars. -     Cancel: Hemoglobin A1c; Future -     Cancel: Microalbumin / creatinine urine ratio; Future  Routine general medical examination at a health care facility- exam completed, labs ordered and will be reviewed, vaccines reviewed and updated, his colonoscopy is up-to-date, patient education material was given. -     Cancel: Lipid panel; Future -     Cancel: Comprehensive metabolic panel; Future -     Cancel: CBC with Differential/Platelet; Future -  PSA; Future -     Cancel: TSH; Future -     Cancel: Urinalysis, Routine w reflex microscopic ([facility]); Future   I am having Mr. Durene CalHunter maintain his acetaminophen, glucose blood, ONETOUCH DELICA LANCETS 33G, ALL DAY ALLERGY-D, promethazine, and metformin.  No orders of the defined types were placed in this encounter.    Follow-up: Return in about 6 months (around 10/15/2016).  Sanda Lingerhomas Couper Juncaj, MD

## 2016-04-17 NOTE — Progress Notes (Signed)
Pre visit review using our clinic review tool, if applicable. No additional management support is needed unless otherwise documented below in the visit note. 

## 2016-04-18 ENCOUNTER — Telehealth: Payer: Self-pay | Admitting: Emergency Medicine

## 2016-04-18 DIAGNOSIS — Z0000 Encounter for general adult medical examination without abnormal findings: Secondary | ICD-10-CM

## 2016-04-18 NOTE — Telephone Encounter (Signed)
Nathan Park from [facility] called and is asking for orders to be put in for the patient. Patient is there now to have this done. Please advise thanks.

## 2016-04-18 NOTE — Telephone Encounter (Signed)
Faxed req to [facility] for labs that were ordered during visit.

## 2016-04-19 LAB — TSH: TSH: 0.57 u[IU]/mL (ref 0.41–5.90)

## 2016-04-19 LAB — LIPID PANEL
CHOLESTEROL: 147 mg/dL (ref 0–200)
HDL: 43 mg/dL (ref 35–70)
LDL Cholesterol: 84 mg/dL
LDL/HDL RATIO: 3.4
TRIGLYCERIDES: 101 mg/dL (ref 40–160)

## 2016-04-19 LAB — BASIC METABOLIC PANEL
BUN: 16 mg/dL (ref 4–21)
CREATININE: 0.9 mg/dL (ref 0.6–1.3)
Glucose: 120 mg/dL
Potassium: 4.8 mmol/L (ref 3.4–5.3)
Sodium: 141 mmol/L (ref 137–147)

## 2016-04-19 LAB — CBC AND DIFFERENTIAL
HCT: 45 % (ref 41–53)
Hemoglobin: 15.4 g/dL (ref 13.5–17.5)
NEUTROS ABS: 4 /uL
PLATELETS: 236 10*3/uL (ref 150–399)
WBC: 7.4 10*3/mL

## 2016-04-19 LAB — HEPATIC FUNCTION PANEL
ALT: 24 U/L (ref 10–40)
AST: 19 U/L (ref 14–40)
BILIRUBIN, TOTAL: 0.5 mg/dL

## 2016-04-19 LAB — HEMOGLOBIN A1C: Hemoglobin A1C: 7.1

## 2016-04-19 LAB — PSA: PSA: 4

## 2016-04-22 ENCOUNTER — Encounter: Payer: Self-pay | Admitting: Internal Medicine

## 2016-05-06 ENCOUNTER — Encounter: Payer: Self-pay | Admitting: Internal Medicine

## 2016-05-26 ENCOUNTER — Encounter: Payer: Self-pay | Admitting: Internal Medicine

## 2016-07-05 ENCOUNTER — Other Ambulatory Visit: Payer: Self-pay | Admitting: Internal Medicine

## 2016-07-14 ENCOUNTER — Encounter: Payer: Self-pay | Admitting: Internal Medicine

## 2016-07-15 ENCOUNTER — Other Ambulatory Visit: Payer: Self-pay | Admitting: Internal Medicine

## 2016-07-15 MED ORDER — ZOSTER VACCINE LIVE 19400 UNT/0.65ML ~~LOC~~ SUSR: 0.6500 mL | Freq: Once | SUBCUTANEOUS | 0 refills | Status: AC

## 2016-08-16 ENCOUNTER — Encounter: Payer: Self-pay | Admitting: Internal Medicine

## 2016-08-16 ENCOUNTER — Ambulatory Visit (INDEPENDENT_AMBULATORY_CARE_PROVIDER_SITE_OTHER): Payer: 59 | Admitting: Internal Medicine

## 2016-08-16 VITALS — BP 138/78 | HR 84 | Temp 99.0°F | Resp 20 | Wt 185.0 lb

## 2016-08-16 DIAGNOSIS — R05 Cough: Secondary | ICD-10-CM

## 2016-08-16 DIAGNOSIS — R059 Cough, unspecified: Secondary | ICD-10-CM | POA: Insufficient documentation

## 2016-08-16 LAB — POCT INFLUENZA A/B
INFLUENZA A, POC: NEGATIVE
INFLUENZA B, POC: NEGATIVE

## 2016-08-16 MED ORDER — AZITHROMYCIN 250 MG PO TABS: ORAL_TABLET | ORAL | 1 refills | Status: DC

## 2016-08-16 MED ORDER — HYDROCODONE-HOMATROPINE 5-1.5 MG/5ML PO SYRP: 5.0000 mL | ORAL_SOLUTION | Freq: Four times a day (QID) | ORAL | 0 refills | Status: AC | PRN

## 2016-08-16 NOTE — Progress Notes (Signed)
Subjective:    Patient ID: Nathan Park, male    DOB: 1966/07/29, 50 y.o.   MRN: [id number]  HPI  Here with acute onset mild to mod 2-3 days ST, HA, general weakness and malaise, with prod cough greenish sputum, but Pt denies chest pain, increased sob or doe, wheezing, orthopnea, PND, increased LE swelling, palpitations, dizziness or syncope.  Pt denies polydipsia, polyuria, No other new history Past Medical History:  Diagnosis Date  . Barrett esophagus 01/31/10   at 32 cm  . Complication of anesthesia    slow to wake up  . Diabetes mellitus   . Diverticulosis   . GERD (gastroesophageal reflux disease)   . Headache(784.0)   . Hemorrhoids   . Hiatal hernia   . Hiatal hernia   . Nasal congestion   . Rectal pain   . Refusal of blood transfusions as patient is Jehovah's Witness   . Umbilical hernia    Past Surgical History:  Procedure Laterality Date  . CARPAL TUNNEL RELEASE    . LAPAROSCOPIC NISSEN FUNDOPLICATION  08/25/2012   Procedure: LAPAROSCOPIC NISSEN FUNDOPLICATION;  Surgeon: Valarie MerinoMatthew B Martin, MD;  Location: [facility];  Service: General;  Laterality: N/A;  . ORIF DISTAL RADIUS FRACTURE  2009  . repair umblical hernia repair  04/01/10  . sigmoid colectomy  04/01/10  . take down of colovesical fistula  04/01/10  . takedown of enterovesical fistula  04/01/10  . WRIST SURGERY     left    reports that he has never smoked. He has never used smokeless tobacco. He reports that he drinks about 3.0 oz of alcohol per week . He reports that he does not use drugs. family history includes Diabetes in his father. Allergies  Allergen Reactions  . Morphine And Related     "Does not work for me"  . Oxycodone Hcl Itching and Nausea Only   Current Outpatient Prescriptions on File Prior to Visit  Medication Sig Dispense Refill  . acetaminophen (TYLENOL) 500 MG tablet Take 500-1,000 mg by mouth every 6 (six) hours as needed. For pain    . ALL DAY ALLERGY-D 5-120 MG tablet TAKE ONE TABLET BY  MOUTH TWICE DAILY 180 tablet 3  . glucose blood (ONE TOUCH ULTRA TEST) test strip Use as instructed 100 each 12  . metformin (FORTAMET) 1000 MG (OSM) 24 hr tablet TAKE 1 TABLET DAILY WITH BREAKFAST (YEARLY PHYSICAL WITH LABS ARE DUE. MUST SEE DOCTOR FOR REFILLS) 90 tablet 1  . ONETOUCH DELICA LANCETS 33G MISC Test up to BID 200 each 4  . promethazine (PHENERGAN) 12.5 MG tablet Take 1 tablet (12.5 mg total) by mouth every 6 (six) hours as needed for nausea or vomiting. 20 tablet 0  . [DISCONTINUED] Azelastine-Fluticasone (DYMISTA) 137-50 MCG/ACT SUSP Place 1 Act into the nose 2 (two) times daily. 3 Bottle 3   No current facility-administered medications on file prior to visit.    Review of Systems  All otherwise neg per pt      Objective:   Physical Exam BP 138/78   Pulse 84   Temp 99 F (37.2 C) (Oral)   Resp 20   Wt 185 lb (83.9 kg)   SpO2 98%   BMI 29.86 kg/m  VS noted,  Constitutional: Pt appears in no apparent distress HENT: Head: NCAT.  Right Ear: External ear normal.  Left Ear: External ear normal.  Eyes: . Pupils are equal, round, and reactive to light. Conjunctivae and EOM are normal Bilat tm's  with mild erythema.  Max sinus areas mild tender.  Pharynx with mild erythema, no exudate Neck: Normal range of motion. Neck supple. with bilat submandibular LA Cardiovascular: Normal rate and regular rhythm.   Pulmonary/Chest: Effort normal and breath sounds without rales or wheezing.  Neurological: Pt is alert. Not confused , motor grossly intact Skin: Skin is warm. No rash, no LE edema Psychiatric: Pt behavior is normal. No agitation.  No other new exam findings    Assessment & Plan:

## 2016-08-16 NOTE — Patient Instructions (Signed)
Please take all new medication as prescribed - the antibiotic, and cough medicine if needed  Please continue all other medications as before, and refills have been done if requested.  Please have the pharmacy call with any other refills you may need.  Please keep your appointments with your specialists as you may have planned     

## 2016-08-16 NOTE — Progress Notes (Signed)
Pre visit review using our clinic review tool, if applicable. No additional management support is needed unless otherwise documented below in the visit note. 

## 2016-08-17 NOTE — Assessment & Plan Note (Signed)
Mild to mod, c/w bronchitis vs pna, declines cxr, for antibx course, cough med prn,  to f/u any worsening symptoms or concerns 

## 2017-01-01 ENCOUNTER — Other Ambulatory Visit: Payer: Self-pay | Admitting: Internal Medicine

## 2017-02-11 LAB — HM DIABETES EYE EXAM

## 2017-02-19 ENCOUNTER — Other Ambulatory Visit (INDEPENDENT_AMBULATORY_CARE_PROVIDER_SITE_OTHER): Payer: 59

## 2017-02-19 ENCOUNTER — Encounter: Payer: Self-pay | Admitting: Internal Medicine

## 2017-02-19 ENCOUNTER — Ambulatory Visit (INDEPENDENT_AMBULATORY_CARE_PROVIDER_SITE_OTHER): Payer: 59 | Admitting: Internal Medicine

## 2017-02-19 VITALS — BP 112/80 | HR 90 | Temp 98.1°F | Resp 16 | Ht 66.0 in | Wt 184.5 lb

## 2017-02-19 DIAGNOSIS — E785 Hyperlipidemia, unspecified: Secondary | ICD-10-CM

## 2017-02-19 DIAGNOSIS — Z794 Long term (current) use of insulin: Secondary | ICD-10-CM

## 2017-02-19 DIAGNOSIS — Z1159 Encounter for screening for other viral diseases: Secondary | ICD-10-CM

## 2017-02-19 DIAGNOSIS — E118 Type 2 diabetes mellitus with unspecified complications: Secondary | ICD-10-CM | POA: Diagnosis not present

## 2017-02-19 LAB — BASIC METABOLIC PANEL
BUN: 23 mg/dL (ref 6–23)
CO2: 30 mEq/L (ref 19–32)
Calcium: 9.4 mg/dL (ref 8.4–10.5)
Chloride: 103 mEq/L (ref 96–112)
Creatinine, Ser: 1.02 mg/dL (ref 0.40–1.50)
GFR: 81.18 mL/min (ref >60.00)
Glucose, Bld: 131 mg/dL — ABNORMAL HIGH (ref 70–99)
POTASSIUM: 4.8 meq/L (ref 3.5–5.1)
SODIUM: 141 meq/L (ref 135–145)

## 2017-02-19 LAB — MICROALBUMIN / CREATININE URINE RATIO
Creatinine,U: 202.5 mg/dL
MICROALB/CREAT RATIO: 1.1 mg/g (ref 0.0–30.0)
Microalb, Ur: 2.2 mg/dL — ABNORMAL HIGH (ref 0.0–1.9)

## 2017-02-19 LAB — HEMOGLOBIN A1C: HEMOGLOBIN A1C: 7.2 % — AB (ref 4.6–6.5)

## 2017-02-19 MED ORDER — ROSUVASTATIN CALCIUM 5 MG PO TABS: 5.0000 mg | ORAL_TABLET | Freq: Every day | ORAL | 3 refills | Status: DC

## 2017-02-19 MED ORDER — ASPIRIN EC 81 MG PO TBEC: 81.0000 mg | DELAYED_RELEASE_TABLET | Freq: Every day | ORAL | 3 refills | Status: DC

## 2017-02-19 NOTE — Progress Notes (Signed)
Subjective:  Patient ID: Nathan Park, male    DOB: 09-28-1965  Age: 51 y.o. MRN: [id number]  CC: Diabetes and Hyperlipidemia   HPI Nathan Park presents for f/up - He feels well and offers no complaints. He tells me his blood sugars have been well controlled. He has an aggressive walking program and has had no recent episodes of DOE, CP, SOB, palpitations, edema, or fatigue.  Outpatient Medications Prior to Visit  Medication Sig Dispense Refill  . glucose blood (ONE TOUCH ULTRA TEST) test strip Use as instructed 100 each 12  . metformin (FORTAMET) 1000 MG (OSM) 24 hr tablet TAKE 1 TABLET DAILY WITH BREAKFAST (YEARLY PHYSICAL WITH LABS ARE DUE. MUST SEE DOCTOR FOR REFILLS) 90 tablet 1  . ONETOUCH DELICA LANCETS 33G MISC Test up to BID 200 each 4  . acetaminophen (TYLENOL) 500 MG tablet Take 500-1,000 mg by mouth every 6 (six) hours as needed. For pain    . ALL DAY ALLERGY-D 5-120 MG tablet TAKE ONE TABLET BY MOUTH TWICE DAILY 180 tablet 3  . azithromycin (ZITHROMAX Z-PAK) 250 MG tablet 2 tablet on day 1, then 1 per day 6 tablet 1  . promethazine (PHENERGAN) 12.5 MG tablet Take 1 tablet (12.5 mg total) by mouth every 6 (six) hours as needed for nausea or vomiting. 20 tablet 0   No facility-administered medications prior to visit.     ROS Review of Systems  Constitutional: Negative for appetite change, diaphoresis, fatigue and unexpected weight change.  HENT: Negative.  Negative for trouble swallowing.   Eyes: Negative for visual disturbance.  Respiratory: Negative.  Negative for cough, chest tightness, shortness of breath and wheezing.   Cardiovascular: Negative for chest pain, palpitations and leg swelling.  Gastrointestinal: Negative for abdominal pain, constipation, diarrhea, nausea and vomiting.  Endocrine: Negative for polydipsia, polyphagia and polyuria.  Genitourinary: Negative.  Negative for difficulty urinating, dysuria, frequency and urgency.  Musculoskeletal: Negative.   Negative for arthralgias and myalgias.  Skin: Negative.  Negative for color change and rash.  Neurological: Negative.  Negative for dizziness and weakness.  Hematological: Negative for adenopathy. Does not bruise/bleed easily.  Psychiatric/Behavioral: Negative.     Objective:  BP 112/80 (BP Location: Left Arm, Patient Position: Sitting, Cuff Size: Normal)   Pulse 90   Temp 98.1 F (36.7 C) (Oral)   Resp 16   Ht 5\' 6"  (1.676 m)   Wt 184 lb 8 oz (83.7 kg)   SpO2 99%   BMI 29.78 kg/m   BP Readings from Last 3 Encounters:  02/19/17 112/80  08/16/16 138/78  04/17/16 140/90    Wt Readings from Last 3 Encounters:  02/19/17 184 lb 8 oz (83.7 kg)  08/16/16 185 lb (83.9 kg)  04/17/16 184 lb (83.5 kg)    Physical Exam  Constitutional: He is oriented to person, place, and time. No distress.  HENT:  Mouth/Throat: Oropharynx is clear and moist. No oropharyngeal exudate.  Eyes: Conjunctivae are normal. Right eye exhibits no discharge. Left eye exhibits no discharge. No scleral icterus.  Neck: Normal range of motion. Neck supple. No JVD present. No thyromegaly present.  Cardiovascular: Normal rate, regular rhythm and intact distal pulses.  Exam reveals no gallop and no friction rub.   No murmur heard. Pulmonary/Chest: Effort normal and breath sounds normal. No respiratory distress. He has no wheezes. He has no rales. He exhibits no tenderness.  Abdominal: Soft. Bowel sounds are normal. He exhibits no distension and no mass. There is no  tenderness. There is no rebound and no guarding.  Musculoskeletal: Normal range of motion. He exhibits no edema, tenderness or deformity.  Lymphadenopathy:    He has no cervical adenopathy.  Neurological: He is alert and oriented to person, place, and time.  Skin: Skin is warm and dry. No rash noted. He is not diaphoretic. No erythema. No pallor.  Vitals reviewed.   Lab Results  Component Value Date   WBC 7.4 04/19/2016   HGB 15.4 04/19/2016   HCT  45 04/19/2016   PLT 236 04/19/2016   GLUCOSE 131 (H) 02/19/2017   CHOL 147 04/19/2016   TRIG 101 04/19/2016   HDL 43 04/19/2016   LDLCALC 84 04/19/2016   ALT 24 04/19/2016   AST 19 04/19/2016   NA 141 02/19/2017   K 4.8 02/19/2017   CL 103 02/19/2017   CREATININE 1.02 02/19/2017   BUN 23 02/19/2017   CO2 30 02/19/2017   TSH 0.57 04/19/2016   PSA 4.0 04/19/2016   INR 1.08 03/25/2010   HGBA1C 7.2 (H) 02/19/2017   MICROALBUR 2.2 (H) 02/19/2017    Dg Chest 2 View  Result Date: 08/17/2012 *RADIOLOGY REPORT* Clinical Data: Preoperative evaluation for Nissen fundoplication. Diabetes.  Nonsmoker.  No current chest complaints CHEST - 2 VIEW Comparison: 03/25/2010 Findings: Heart and mediastinal contours are within normal limits. The lung fields appear clear with no signs of focal infiltrate or congestive failure.  No pleural fluid or significant peribronchial cuffing is seen. Bony structures appear intact. IMPRESSION: Stable cardiopulmonary appearance with no new focal or acute abnormality identified. Original Report Authenticated By: Rhodia Albright, M.D.    Assessment & Plan:   Nathan Park was seen today for diabetes and hyperlipidemia.  Diagnoses and all orders for this visit:  Type 2 diabetes mellitus with complication, with long-term current use of insulin (HCC)- his A1c is up to 7.2%, his blood sugars are not adequately well controlled on monotherapy with metformin however he is not willing to add another hypoglycemic agent at this time. He is willing to improve his lifestyle modifications. -     Basic metabolic panel; Future -     Hemoglobin A1c; Future -     Microalbumin / creatinine urine ratio; Future -     rosuvastatin (CRESTOR) 5 MG tablet; Take 1 tablet (5 mg total) by mouth daily. -     aspirin EC 81 MG tablet; Take 1 tablet (81 mg total) by mouth daily.  Hyperlipidemia LDL goal <70- I've asked him to start statin and aspirin therapy for CV risk reduction. -     rosuvastatin  (CRESTOR) 5 MG tablet; Take 1 tablet (5 mg total) by mouth daily. -     aspirin EC 81 MG tablet; Take 1 tablet (81 mg total) by mouth daily.  Need for hepatitis C screening test -     Hepatitis C antibody; Future   I have discontinued Nathan Park acetaminophen, ALL DAY ALLERGY-D, promethazine, and azithromycin. I am also having him start on rosuvastatin and aspirin EC. Additionally, I am having him maintain his glucose blood, ONETOUCH DELICA LANCETS 33G, and metformin.  Meds ordered this encounter  Medications  . rosuvastatin (CRESTOR) 5 MG tablet    Sig: Take 1 tablet (5 mg total) by mouth daily.    Dispense:  90 tablet    Refill:  3  . aspirin EC 81 MG tablet    Sig: Take 1 tablet (81 mg total) by mouth daily.    Dispense:  90 tablet  Refill:  3     Follow-up: Return in about 6 months (around 08/22/2017).  Sanda Linger, MD

## 2017-02-19 NOTE — Patient Instructions (Signed)

## 2017-02-20 ENCOUNTER — Encounter: Payer: Self-pay | Admitting: Internal Medicine

## 2017-02-20 LAB — HEPATITIS C ANTIBODY: HCV AB: NEGATIVE

## 2017-02-23 ENCOUNTER — Other Ambulatory Visit: Payer: Self-pay | Admitting: Internal Medicine

## 2017-02-23 DIAGNOSIS — E118 Type 2 diabetes mellitus with unspecified complications: Secondary | ICD-10-CM

## 2017-02-23 MED ORDER — SITAGLIPTIN PHOSPHATE 100 MG PO TABS: 100.0000 mg | ORAL_TABLET | Freq: Every day | ORAL | 1 refills | Status: DC

## 2017-02-24 MED ORDER — METFORMIN HCL ER (OSM) 1000 MG PO TB24: 1000.0000 mg | ORAL_TABLET | Freq: Every day | ORAL | 1 refills | Status: DC

## 2017-02-24 MED ORDER — SITAGLIPTIN PHOSPHATE 100 MG PO TABS: 100.0000 mg | ORAL_TABLET | Freq: Every day | ORAL | 1 refills | Status: DC

## 2017-02-27 ENCOUNTER — Telehealth: Payer: Self-pay

## 2017-02-27 NOTE — Telephone Encounter (Signed)
PA started KEY: [id number]

## 2017-03-02 ENCOUNTER — Telehealth: Payer: Self-pay

## 2017-03-02 NOTE — Telephone Encounter (Addendum)
Pt called back and has spoken to the ins company, it was denied because the Fortamet was not in his formulary and they do not cover 1000mg ,  He did find out that they will cover Glucophage 500mg  extended release and 750mg  extended release. Please advise. He would like a call back with what will be called in.   His insurance company did not give him any info about the PA for [place].

## 2017-03-02 NOTE — Telephone Encounter (Signed)
rf rx for one touch delica lancets.

## 2017-03-02 NOTE — Telephone Encounter (Signed)
PA was denied patient was contacted  Patient is going to call insurance to see why and call us back.

## 2017-03-03 ENCOUNTER — Telehealth: Payer: Self-pay

## 2017-03-03 ENCOUNTER — Other Ambulatory Visit: Payer: Self-pay | Admitting: Internal Medicine

## 2017-03-03 DIAGNOSIS — E118 Type 2 diabetes mellitus with unspecified complications: Secondary | ICD-10-CM

## 2017-03-03 MED ORDER — METFORMIN HCL ER 750 MG PO TB24: 1500.0000 mg | ORAL_TABLET | Freq: Every day | ORAL | 1 refills | Status: DC

## 2017-03-03 MED ORDER — ONETOUCH DELICA LANCETS 33G MISC: 4 refills | Status: AC

## 2017-03-03 NOTE — Telephone Encounter (Signed)
Patients insurance no longer covers Metformin (fortamet) 1000mg , but will cover Glucophage 500mg  extended release and 750mg  extended release. Please advise.

## 2017-03-03 NOTE — Telephone Encounter (Signed)
RX sent

## 2017-03-03 NOTE — Telephone Encounter (Signed)
erx sent

## 2017-03-04 ENCOUNTER — Telehealth: Payer: Self-pay

## 2017-03-04 ENCOUNTER — Other Ambulatory Visit: Payer: Self-pay | Admitting: Internal Medicine

## 2017-03-04 DIAGNOSIS — E118 Type 2 diabetes mellitus with unspecified complications: Secondary | ICD-10-CM

## 2017-03-04 MED ORDER — ALOGLIPTIN BENZOATE 25 MG PO TABS: 1.0000 | ORAL_TABLET | Freq: Every day | ORAL | 1 refills | Status: DC

## 2017-03-04 NOTE — Telephone Encounter (Signed)
Patients insurance prefers Nesina over ExlineJanuvia is this an appropriate change. Please advise

## 2017-03-04 NOTE — Telephone Encounter (Signed)
Patients One touch meter broke and wants to know if he can get another one sent in. Please advise

## 2017-03-04 NOTE — Telephone Encounter (Signed)
changed

## 2017-03-04 NOTE — Telephone Encounter (Signed)
LVM for patient stating Alogliptin and Glucophage was sent to the pharmacy and that if he has any questions to give us a call.

## 2017-03-04 NOTE — Telephone Encounter (Signed)
Called and left patient a VM stating prescription for Glucophage 750mg  er is ready for pick up and that and I am starting the PA for his Januvia

## 2017-03-19 ENCOUNTER — Encounter: Payer: Self-pay | Admitting: Neurology

## 2017-03-23 ENCOUNTER — Encounter: Payer: Self-pay | Admitting: Neurology

## 2017-03-23 ENCOUNTER — Ambulatory Visit (INDEPENDENT_AMBULATORY_CARE_PROVIDER_SITE_OTHER): Payer: 59 | Admitting: Neurology

## 2017-03-23 VITALS — BP 112/73 | HR 67 | Ht 66.0 in | Wt 187.0 lb

## 2017-03-23 DIAGNOSIS — K219 Gastro-esophageal reflux disease without esophagitis: Secondary | ICD-10-CM | POA: Diagnosis not present

## 2017-03-23 DIAGNOSIS — R51 Headache: Secondary | ICD-10-CM

## 2017-03-23 DIAGNOSIS — G4730 Sleep apnea, unspecified: Secondary | ICD-10-CM

## 2017-03-23 DIAGNOSIS — R0681 Apnea, not elsewhere classified: Secondary | ICD-10-CM | POA: Diagnosis not present

## 2017-03-23 DIAGNOSIS — G4710 Hypersomnia, unspecified: Secondary | ICD-10-CM

## 2017-03-23 DIAGNOSIS — R519 Headache, unspecified: Secondary | ICD-10-CM

## 2017-03-23 DIAGNOSIS — G479 Sleep disorder, unspecified: Secondary | ICD-10-CM | POA: Diagnosis not present

## 2017-03-23 DIAGNOSIS — R0683 Snoring: Secondary | ICD-10-CM

## 2017-03-23 DIAGNOSIS — R5383 Other fatigue: Secondary | ICD-10-CM | POA: Diagnosis not present

## 2017-03-23 DIAGNOSIS — G4719 Other hypersomnia: Secondary | ICD-10-CM | POA: Diagnosis not present

## 2017-03-23 NOTE — Progress Notes (Signed)
SLEEP MEDICINE CLINIC   Provider:  Melvyn Novas, M D  Primary Care Physician:  Etta Grandchild, MD   Referring Provider: Dentist Dr. Robbie Lis  @ [facility].   Chief Complaint  Patient presents with  . New Patient (Initial Visit)  Alone   HPI:  Nathan Park is a 51 y.o. male , seen here as in a referral from Dr. Earlene Plater, DDS, for a sleep evaluation,   Chief complaint according to patient : Mr. Earlene Plater is seen here today as a referral by his dentist Dr. Robbie Lis, he has a chief concern of being excessively daytime sleepy, drifting off to sleep whenever her situation allows. He finds himself falling asleep and physically not active and mentally not stimulated, such as in some business related meetings, watching TV, reading and maybe even listening to a conversation. His wife and daughter have advised him that he snores loudly, and only recently did she realize that he drinks exorbitant amounts of caffeine.His most difficult time to stay awake this afternoon lunch. He has been witnessed not just to snore but also to have apnea, snores on a regular basis, feels often tired and not refreshed in the morning and has frequent headaches, he also has GERD.   Sleep habits are as follows: The patient's bedtime is between 11 PM and midnight, with an elevated head of bed, he shares a bedroom with his wife. The couple uses a fan for white noise in the background, the bedroom is described as cool and dark. He prefers a supine sleep position, but his wife has noticed that she snores the loudest. When she not just him and he tries to sleep on his side he scoots down the elevated part of the mattress. He usually has to go to the bathroom around 4 AM, can go back to sleep and sleeps until he has to rise in the morning at 6 AM. He wakes up spontaneously before his alarm rings. His wife rises later than him at about 7. He has his biggest meal of the day in the morning with breakfast and  compresses grits, egg steak, coffee.   Sleep medical history and family sleep history: Mr. Sanluis is a diabetic, diagnosed in the year 2012. He endorsed frequent sinus trouble, sometimes hearing difficulties and he has gained weight over the last 24 month. He endorsed 19 points and the Epworth sleepiness score at his dentist's office. 3 points for all questions except question #6 and 8 at one point, a #7 at 2 points. His most difficult time to stay awake this afternoon lunch. He has been witnessed not just to snore but also to have apnea, snores on a regular basis, feels often tired and not refreshed in the morning and has frequent headaches, he also has GERD/ hiatal hernia.   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   3-4 cups of coffee in AM, at work he drinks 4- 6 cups more before noon, He drinks 2 bottled of wine a week. No tobacco use- Ever    Review of Systems: Out of a complete 14 system review, the patient complains of only the following symptoms, and all other reviewed systems are negative. Morning headaches, severe- GERD, alcohol user, caffeine user.  Palpitations, sleepiness, fatigue .witnessed apnea and crescendo snoring.   Epworth score  17-15  , Fatigue severity score 47  , depression score  Social History   Social History  . Marital status: Married    Spouse name: N/A  . Number of children: 4  . Years of education: N/A   Occupational History  .  [employer]   Social History Main Topics  . Smoking status: Never Smoker  . Smokeless tobacco: Never Used  . Alcohol use 3.0 oz/week    3 Glasses of wine, 2 Shots of liquor per week     Comment: 2 drinks a week   . Drug use: No  . Sexual activity: Yes   Other Topics Concern  . Not on file   Social History Narrative   No caffeine drinks     Family History  Problem Relation Age of Onset  . Diabetes Father   . Colon cancer  Neg Hx   . Cancer Neg Hx   . Heart disease Neg Hx   . Hypertension Neg Hx   . Kidney disease Neg Hx     Past Medical History:  Diagnosis Date  . Barrett esophagus 01/31/10   at 32 cm  . Complication of anesthesia    slow to wake up  . Diabetes mellitus   . Diverticulosis   . GERD (gastroesophageal reflux disease)   . Headache(784.0)   . Hemorrhoids   . Hiatal hernia   . Hiatal hernia   . Nasal congestion   . Rectal pain   . Refusal of blood transfusions as patient is Jehovah's Witness   . Umbilical hernia     Past Surgical History:  Procedure Laterality Date  . CARPAL TUNNEL RELEASE    . LAPAROSCOPIC NISSEN FUNDOPLICATION  08/25/2012   Procedure: LAPAROSCOPIC NISSEN FUNDOPLICATION;  Surgeon: Valarie Merino, MD;  Location: [facility];  Service: General;  Laterality: N/A;  . ORIF DISTAL RADIUS FRACTURE  2009  . repair umblical hernia repair  04/01/10  . sigmoid colectomy  04/01/10  . take down of colovesical fistula  04/01/10  . takedown of enterovesical fistula  04/01/10  . WRIST SURGERY     left    Current Outpatient Prescriptions  Medication Sig Dispense Refill  . aspirin EC 81 MG tablet Take 1 tablet (81 mg total) by mouth daily. 90 tablet 3  . glucose blood (ONE TOUCH ULTRA TEST) test strip Use as instructed 100 each 12  . metFORMIN (GLUCOPHAGE-XR) 750 MG 24 hr tablet Take 2 tablets (1,500 mg total) by mouth daily with breakfast. 180 tablet 1  . ONETOUCH DELICA LANCETS 33G MISC Test up to BID. DX: E11.9 200 each 4  . rosuvastatin (CRESTOR) 5 MG tablet Take 1 tablet (5 mg total) by mouth daily. 90 tablet 3   No current facility-administered medications for this visit.     Allergies as of 03/23/2017 - Review Complete 03/23/2017  Allergen Reaction Noted  . Morphine and related  08/17/2012  . Oxycodone hcl Itching and Nausea Only 05/07/2011    Vitals: BP 112/73   Pulse 67   Ht 5\' 6"  (1.676 m)   Wt 187 lb (84.8 kg)   BMI 30.18 kg/m  Last Weight:  Wt Readings from  Last 1 Encounters:  03/23/17 187 lb (84.8 kg)   UJW:JXBJ mass index is 30.18 kg/m.     Last Height:   Ht Readings from Last 1 Encounters:  03/23/17 5\' 6"  (1.676 m)    Physical exam:  General: The patient is awake, alert and appears not in acute distress. The patient is well groomed. Head: Normocephalic, atraumatic. Neck is  supple. Mallampati 2,  neck circumference:16. Nasal airflow congested, left nostril is blocked. Harlow Asa is seen.  Cardiovascular:  Regular rate and rhythm , without  murmurs or carotid bruit, and without distended neck veins. Respiratory: Lungs are clear to auscultation. Skin:  Without evidence of edema, or rash Trunk: BMI is 30. The patient's posture is erect  Neurologic exam : The patient is awake and alert, oriented to place and time.  Attention span & concentration ability appears normal. Speech is fluent,  without dysarthria, dysphonia or aphasia. Mood and affect are appropriate.  Cranial nerves: Pupils are equal and briskly reactive to light.  Extraocular movements  in vertical and horizontal planes intact and without nystagmus. Visual fields by finger perimetry are intact. Hearing to finger rub intact. Facial sensation intact to fine touch. Facial motor strength is symmetric and tongue and uvula move midline. Shoulder shrug was symmetrical.   Motor exam:  Normal tone, muscle bulk and symmetric strength in all extremities. Sensory:  Fine touch, pinprick and vibration were tested in all extremities. Proprioception tested in the upper extremities was normal. Coordination: Rapid alternating movements in the fingers/hands was normal. Finger-to-nose maneuver  normal without evidence of ataxia, dysmetria or tremor. Gait and station: Patient walks without assistive device - Strength within normal limits.Stance is stable and normal.  Turns with 3  Steps.  Deep tendon reflexes: in the  upper and lower extremities are symmetric and intact. Babinski maneuver response  is downgoing.  Assessment:  After physical and neurologic examination, review of laboratory studies,  Personal review of imaging studies, reports of other /same  Imaging studies, results of polysomnography and / or neurophysiology testing and pre-existing records as far as provided in visit., my assessment is   1)  Mr. Asahel Risden suffers from excessive daytime sleepiness, a high degree of daytime fatigue, in spite of using a significant amount of caffeine each night. He also drinks alcohol regularly in the evening hours. He does not consider his sleep fragmented. And he usually gets only 6 hours or less of nocturnal sleep, sleep deprivation is evident. His comorbidities of diabetes and GERD also will affect his sleep, the frequency of nocturia, the narrowing of the upper airway, and his sleep position to avoid acid reflux. He sleeps best with the head of bed elevated in supine but he snorts the loudest in this position. He also wakes up with morning headaches that resolve by themselves. These could be a sign of CO2 retention, it could also be caffeine withdrawl in the morning.  Since Mr. Ladnier is referred by Dr. Earlene Plater, DDS, my special attention will be to determine if he is a dental device candidate or in need of CPAP>   Sleep hygiene rules given and explained- advance bedtime to before midnight.  Alcohol not after 8 PM, one glass only, caffeine only 2 cups , only in AM>    The patient was advised of the nature of the diagnosed disorder , the treatment options and the  risks for general health and wellness arising from not treating the condition.   I spent more than 45 minutes of face to face time with the patient.  Greater than 50% of time was spent in counseling and coordination of care. We have discussed the diagnosis and differential and I answered the patient's questions.    Plan:  Treatment plan and additional workup :  SPLIT night polysomnography with capnography.    Melvyn Novas, MD  03/23/2017, 8:12 AM  Certified in Neurology  by ABPN Certified in Sleep Medicine by [facility]  [facility] [address], [address] [place], [place] [id number]

## 2017-03-23 NOTE — Patient Instructions (Signed)
Please remember to try to maintain good sleep hygiene, which means: Keep a regular sleep and wake schedule, try not to exercise or have a meal within 2 hours of your bedtime, try to keep your bedroom conducive for sleep, that is, cool and dark, without light distractors such as an illuminated alarm clock, and refrain from watching TV right before sleep or in the middle of the night and do not keep the TV or radio on during the night. Also, try not to use or play on electronic devices at bedtime, such as your cell phone, tablet PC or laptop. If you like to read at bedtime on an electronic device, try to dim the background light as much as possible. Do not eat in the middle of the night.   We will request a sleep study.    We will look for leg twitching and snoring or sleep apnea.   We will call you with the sleep study results and make a follow up appointment if needed.    

## 2017-04-16 ENCOUNTER — Encounter: Payer: Self-pay | Admitting: Family Medicine

## 2017-04-16 ENCOUNTER — Ambulatory Visit (INDEPENDENT_AMBULATORY_CARE_PROVIDER_SITE_OTHER): Payer: 59 | Admitting: Family Medicine

## 2017-04-16 VITALS — BP 112/80 | HR 66 | Temp 98.3°F | Ht 66.0 in | Wt 185.0 lb

## 2017-04-16 DIAGNOSIS — L03317 Cellulitis of buttock: Secondary | ICD-10-CM

## 2017-04-16 DIAGNOSIS — Z23 Encounter for immunization: Secondary | ICD-10-CM | POA: Diagnosis not present

## 2017-04-16 MED ORDER — CEPHALEXIN 500 MG PO CAPS: 500.0000 mg | ORAL_CAPSULE | Freq: Two times a day (BID) | ORAL | 0 refills | Status: DC

## 2017-04-16 NOTE — Assessment & Plan Note (Signed)
It appears that he has a skin infection. There does not appear to be an area that can be drained. There doesn't appear to be a cyst. - Initiate Keflex - Can take NSAIDs for pain. - Given indications for follow-up.

## 2017-04-16 NOTE — Patient Instructions (Addendum)
Thank you for coming in,   It appears that you have a skin infection. There doesn't appear to be a cyst or an abscess. Please try to take a probiotic with the antibiotic. We are open this Saturday in case this doesn't get any better.    Please feel free to call with any questions or concerns at any time, at [phone]. --Dr. Jordan LikesSchmitz

## 2017-04-16 NOTE — Progress Notes (Signed)
Nathan Park - 51 y.o. male MRN [id number]  Date of birth: 10-Jan-1966  SUBJECTIVE:  Including CC & ROS.  Chief Complaint  Patient presents with  . Cyst    on tailbone area. showed up sunday. patient states he has had this before and lasted a week and went away, but now it is back and has grown quickly. patient states it is more painful this time and it hurts to sit    Nathan Park is a 51 year old malepresenting with buttock pain. He reports symptoms have been there for a week and seemed to improve. His symptoms have returned and they seem to have gotten worse. It is occurring in the right buttock near the gluteal cleft. He denies any draining. He is having pain with sitting down. He denies any fevers or chills. He denies any penetrating wounds. He has been working a lot outside and sweating significantly.     Review of Systems  Constitutional: Negative for fever.  Musculoskeletal: Negative for gait problem.  Skin: Positive for rash.  Neurological: Negative for weakness and numbness.  Hematological: Negative for adenopathy.   otherwise negative  HISTORY: Past Medical, Surgical, Social, and Family History Reviewed & Updated per EMR.   Pertinent Historical Findings include:  Past Medical History:  Diagnosis Date  . Barrett esophagus 01/31/10   at 32 cm  . Complication of anesthesia    slow to wake up  . Diabetes mellitus   . Diverticulosis   . GERD (gastroesophageal reflux disease)   . Headache(784.0)   . Hemorrhoids   . Hiatal hernia   . Hiatal hernia   . Nasal congestion   . Rectal pain   . Refusal of blood transfusions as patient is Jehovah's Witness   . Umbilical hernia     Past Surgical History:  Procedure Laterality Date  . CARPAL TUNNEL RELEASE    . LAPAROSCOPIC NISSEN FUNDOPLICATION  08/25/2012   Procedure: LAPAROSCOPIC NISSEN FUNDOPLICATION;  Surgeon: Valarie Merino, MD;  Location: [facility];  Service: General;  Laterality: N/A;  . ORIF DISTAL RADIUS FRACTURE  2009  .  repair umblical hernia repair  04/01/10  . sigmoid colectomy  04/01/10  . take down of colovesical fistula  04/01/10  . takedown of enterovesical fistula  04/01/10  . WRIST SURGERY     left    Allergies  Allergen Reactions  . Morphine And Related     "Does not work for me"  . Oxycodone Hcl Itching and Nausea Only    Family History  Problem Relation Age of Onset  . Diabetes Father   . Colon cancer Neg Hx   . Cancer Neg Hx   . Heart disease Neg Hx   . Hypertension Neg Hx   . Kidney disease Neg Hx      Social History   Social History  . Marital status: Married    Spouse name: N/A  . Number of children: 4  . Years of education: N/A   Occupational History  .  [employer]   Social History Main Topics  . Smoking status: Never Smoker  . Smokeless tobacco: Never Used  . Alcohol use 3.0 oz/week    3 Glasses of wine, 2 Shots of liquor per week     Comment: 2 drinks a week   . Drug use: No  . Sexual activity: Yes   Other Topics Concern  . Not on file   Social History Narrative   No caffeine drinks  PHYSICAL EXAM:  VS: BP 112/80 (BP Location: Left Arm, Patient Position: Standing, Cuff Size: Normal)   Pulse 66   Temp 98.3 F (36.8 C) (Oral)   Ht 5\' 6"  (1.676 m)   Wt 185 lb (83.9 kg)   SpO2 99%   BMI 29.86 kg/m  Physical Exam Gen: NAD, alert, cooperative with exam, well-appearing ENT: normal lips, normal nasal mucosa,  Eye: normal EOM, normal conjunctiva and lids CV:  no edema, +2 pedal pulses   Resp: no accessory muscle use, non-labored,  Skin: Area of redness on the right buttock but not completely in the gluteal cleft. No areas of streaking. No purulence. No area of fluctuance but it is indurated. It is tender to touch. Does not appear to be a pilonidal cyst Neuro: normal tone, normal sensation to touch Psych:  normal insight, alert and oriented MSK: Normal gait, normal strength      ASSESSMENT & PLAN:   Cellulitis of buttock It appears that he  has a skin infection. There does not appear to be an area that can be drained. There doesn't appear to be a cyst. - Initiate Keflex - Can take NSAIDs for pain. - Given indications for follow-up.

## 2017-04-27 ENCOUNTER — Ambulatory Visit (INDEPENDENT_AMBULATORY_CARE_PROVIDER_SITE_OTHER): Payer: 59 | Admitting: Neurology

## 2017-04-27 DIAGNOSIS — G4710 Hypersomnia, unspecified: Secondary | ICD-10-CM | POA: Diagnosis not present

## 2017-04-27 DIAGNOSIS — G4730 Sleep apnea, unspecified: Principal | ICD-10-CM

## 2017-04-27 DIAGNOSIS — K219 Gastro-esophageal reflux disease without esophagitis: Secondary | ICD-10-CM

## 2017-04-27 DIAGNOSIS — G479 Sleep disorder, unspecified: Secondary | ICD-10-CM

## 2017-04-27 DIAGNOSIS — R519 Headache, unspecified: Secondary | ICD-10-CM

## 2017-04-27 DIAGNOSIS — R0681 Apnea, not elsewhere classified: Secondary | ICD-10-CM

## 2017-04-27 DIAGNOSIS — R5383 Other fatigue: Secondary | ICD-10-CM

## 2017-04-27 DIAGNOSIS — R51 Headache: Secondary | ICD-10-CM

## 2017-04-27 DIAGNOSIS — G4719 Other hypersomnia: Secondary | ICD-10-CM

## 2017-04-27 DIAGNOSIS — R0683 Snoring: Secondary | ICD-10-CM

## 2017-04-29 NOTE — Procedures (Signed)
[facility] [address] [place], [place] [address] NAME: Nathan AshingHal Goudeau      DOB: Dec 02, 1965 MEDICAL RECORD [phone]     DOS: 04/27/17 REFERRING PHYSICIAN: Robbie Lisourtney Davis, DDS STUDY PERFORMED: Home Sleep Study HISTORY:   Nathan Park is a [age] year male patient, seen here as in a referral from Dr. Earlene Plateravis, DDS, for a sleep evaluation. Chief complaint according to patient: being excessively daytime sleepy, drifting off to sleep whenever her situation allows. He finds himself falling asleep and physically not active and mentally not stimulated, such as in some business related meetings, watching TV, reading and maybe even listening to a conversation. His wife and daughter have advised him that he snores loudly. He drinks exorbitant amounts of caffeine. His most difficult time to stay awake is afternoon post lunch. He has been witnessed not just to snore but also to have apnea, snores on a regular basis, feels often tired and not refreshed in the morning and has frequent headaches, he also has GERD. Epworth score 17-15, Fatigue severity score 47, depression score. BMI 30.1   STUDY RESULTS: Total Recording:    6 hrs. 42 minutes Total Apnea/Hypopnea Index (AHI):  11.8 /hr.  RDI was 13.8 Average Oxygen Saturation: SpO2 92 %; Lowest Oxygen Saturation: 78 %  Time Oxygen Saturation Below 88%: 10 minutes = 2% Average Heart Rate:    65 bpm (47-100) IMPRESSION: This patient has indeed mild sleep apnea, without causing significant physiological stress signs such as tachy- bradycardia or prolonged hypoxemia.  Apneas are mainly obstructive.  RECOMMENDATION: This type of apnea should be treated due to the high level of daytime sleepiness, but mild apnea has a choice of treatment modalities. Dental device or CPAP are possible, weight loss is encouraged.   I certify that I have reviewed the raw data recording prior to the issuance of this report in accordance with the standards of  the American Academy of Sleep Medicine (AASM).  Melvyn Novasarmen Samrat Hayward, MD      04-29-2017   Medical Director of [facility]; Diplomat, ABPN and ABSM Member of and accredited by AASM

## 2017-04-30 ENCOUNTER — Telehealth: Payer: Self-pay | Admitting: Neurology

## 2017-04-30 ENCOUNTER — Other Ambulatory Visit: Payer: Self-pay | Admitting: Neurology

## 2017-04-30 DIAGNOSIS — G4733 Obstructive sleep apnea (adult) (pediatric): Secondary | ICD-10-CM

## 2017-04-30 NOTE — Telephone Encounter (Signed)
I called pt. I advised pt that Dr. Vickey Hugerohmeier reviewed their sleep study results and found that pt mild OSA. Dr. Vickey Hugerohmeier recommends that pt starts CPAP or dental device. Pt stated that he has used a dental device and it helped slightly with snoring but did not help with apneic episodes. Pt would like to proceed with CPAP. I reviewed PAP compliance expectations with the pt. Pt is agreeable to starting a CPAP. I advised pt that an order will be sent to a DME, [facility], and [facility] will call the pt within about one week after they file with the pt's insurance. [facility] will show the pt how to use the machine, fit for masks, and troubleshoot the CPAP if needed. A follow up appt was made for insurance purposes with Darrol Angelarolyn Martin, NP on Jul 29 2017 at 9:15 am . Pt verbalized understanding to arrive 15 minutes early and bring their CPAP. A letter with all of this information in it will be mailed to the pt as a reminder. I verified with the pt that the address we have on file is correct. Pt verbalized understanding of results. Pt had no questions at this time but was encouraged to call back if questions arise.

## 2017-04-30 NOTE — Telephone Encounter (Signed)
-----   Message from Melvyn Novasarmen Dohmeier, MD sent at 04/29/2017  5:49 PM EDT ----- This patient has mild sleep apnea, mainly obstructive apnea. It may not explain the degree of excessive sleepiness he reported.  He needs to treat this apnea to see if it makes a difference in his sleepiness - and he has options of dental device or CPAP .  Maryan Pulsasey , [please let him know that I give him these options and he can tell us his preference. If CPAP is chosen, I will order auto titration 5-12 cm water with mask of choice.  Cc Marcello Mooresom Jones MD

## 2017-06-22 ENCOUNTER — Ambulatory Visit: Payer: Self-pay | Admitting: Internal Medicine

## 2017-06-24 ENCOUNTER — Ambulatory Visit (INDEPENDENT_AMBULATORY_CARE_PROVIDER_SITE_OTHER): Payer: 59 | Admitting: Internal Medicine

## 2017-06-24 ENCOUNTER — Other Ambulatory Visit (INDEPENDENT_AMBULATORY_CARE_PROVIDER_SITE_OTHER): Payer: 59

## 2017-06-24 ENCOUNTER — Encounter: Payer: Self-pay | Admitting: Internal Medicine

## 2017-06-24 VITALS — BP 118/80 | HR 56 | Temp 98.6°F | Resp 16 | Ht 66.0 in | Wt 187.8 lb

## 2017-06-24 DIAGNOSIS — E118 Type 2 diabetes mellitus with unspecified complications: Secondary | ICD-10-CM

## 2017-06-24 DIAGNOSIS — Z23 Encounter for immunization: Secondary | ICD-10-CM | POA: Diagnosis not present

## 2017-06-24 LAB — BASIC METABOLIC PANEL
BUN: 20 mg/dL (ref 6–23)
CALCIUM: 9.2 mg/dL (ref 8.4–10.5)
CHLORIDE: 102 meq/L (ref 96–112)
CO2: 28 meq/L (ref 19–32)
Creatinine, Ser: 1.07 mg/dL (ref 0.40–1.50)
GFR: 76.72 mL/min (ref >60.00)
Glucose, Bld: 102 mg/dL — ABNORMAL HIGH (ref 70–99)
POTASSIUM: 4.3 meq/L (ref 3.5–5.1)
SODIUM: 138 meq/L (ref 135–145)

## 2017-06-24 LAB — HEMOGLOBIN A1C: HEMOGLOBIN A1C: 7 % — AB (ref 4.6–6.5)

## 2017-06-24 MED ORDER — ZOSTER VAC RECOMB ADJUVANTED 50 MCG/0.5ML IM SUSR: 0.5000 mL | Freq: Once | INTRAMUSCULAR | 1 refills | Status: AC

## 2017-06-24 NOTE — Patient Instructions (Signed)

## 2017-06-24 NOTE — Progress Notes (Signed)
Subjective:  Patient ID: Nathan PillionHal S Schweitzer, male    DOB: 19-Mar-1966  Age: 51 y.o. MRN: [id number]  CC: Diabetes   HPI Nathan Park Nathan Park presents for f/up on DM2.  He feels well and offers no complaints.  He has recently been successful with his lifestyle modifications.  Outpatient Medications Prior to Visit  Medication Sig Dispense Refill  . aspirin EC 81 MG tablet Take 1 tablet (81 mg total) by mouth daily. 90 tablet 3  . glucose blood (ONE TOUCH ULTRA TEST) test strip Use as instructed 100 each 12  . metFORMIN (GLUCOPHAGE-XR) 750 MG 24 hr tablet Take 2 tablets (1,500 mg total) by mouth daily with breakfast. 180 tablet 1  . ONETOUCH DELICA LANCETS 33G MISC Test up to BID. DX: E11.9 200 each 4  . rosuvastatin (CRESTOR) 5 MG tablet Take 1 tablet (5 mg total) by mouth daily. 90 tablet 3  . cephALEXin (KEFLEX) 500 MG capsule Take 1 capsule (500 mg total) by mouth 2 (two) times daily. For a total of 7 days. 14 capsule 0   No facility-administered medications prior to visit.     ROS Review of Systems  Constitutional: Negative.  Negative for appetite change, diaphoresis, fatigue and unexpected weight change.  HENT: Negative.   Eyes: Negative for visual disturbance.  Respiratory: Negative.  Negative for cough, chest tightness, shortness of breath and wheezing.   Cardiovascular: Negative.  Negative for chest pain, palpitations and leg swelling.  Gastrointestinal: Negative.  Negative for abdominal pain, diarrhea, nausea and vomiting.  Endocrine: Negative.  Negative for polydipsia, polyphagia and polyuria.  Genitourinary: Negative.  Negative for difficulty urinating.  Musculoskeletal: Negative.  Negative for arthralgias and myalgias.  Skin: Negative.  Negative for color change.  Allergic/Immunologic: Negative.   Neurological: Negative.  Negative for dizziness, weakness, numbness and headaches.  Hematological: Negative for adenopathy. Does not bruise/bleed easily.  Psychiatric/Behavioral: Negative.      Objective:  BP 118/80 (BP Location: Left Arm, Patient Position: Sitting, Cuff Size: Normal)   Pulse (!) 56   Temp 98.6 F (37 C) (Oral)   Resp 16   Ht 5\' 6"  (1.676 m)   Wt 187 lb 12 oz (85.2 kg)   SpO2 98%   BMI 30.30 kg/m   BP Readings from Last 3 Encounters:  06/24/17 118/80  04/16/17 112/80  03/23/17 112/73    Wt Readings from Last 3 Encounters:  06/24/17 187 lb 12 oz (85.2 kg)  04/16/17 185 lb (83.9 kg)  03/23/17 187 lb (84.8 kg)    Physical Exam  Constitutional: He is oriented to person, place, and time. No distress.  HENT:  Mouth/Throat: Oropharynx is clear and moist.  Eyes: Conjunctivae are normal. Right eye exhibits no discharge. Left eye exhibits no discharge. No scleral icterus.  Neck: Normal range of motion. Neck supple. No JVD present. No thyromegaly present.  Cardiovascular: Normal rate and regular rhythm. Exam reveals no gallop.  No murmur heard. Pulmonary/Chest: Effort normal and breath sounds normal. No respiratory distress. He has no wheezes. He has no rales. He exhibits no tenderness.  Abdominal: Soft. Bowel sounds are normal. He exhibits no distension and no mass. There is no tenderness. There is no rebound and no guarding.  Musculoskeletal: Normal range of motion. He exhibits no edema, tenderness or deformity.  Neurological: He is alert and oriented to person, place, and time.  Skin: Skin is warm and dry. No rash noted. He is not diaphoretic. No erythema. No pallor.  Vitals reviewed.  Lab Results  Component Value Date   WBC 7.4 04/19/2016   HGB 15.4 04/19/2016   HCT 45 04/19/2016   PLT 236 04/19/2016   GLUCOSE 102 (H) 06/24/2017   CHOL 147 04/19/2016   TRIG 101 04/19/2016   HDL 43 04/19/2016   LDLCALC 84 04/19/2016   ALT 24 04/19/2016   AST 19 04/19/2016   NA 138 06/24/2017   K 4.3 06/24/2017   CL 102 06/24/2017   CREATININE 1.07 06/24/2017   BUN 20 06/24/2017   CO2 28 06/24/2017   TSH 0.57 04/19/2016   PSA 4.0 04/19/2016   INR  1.08 03/25/2010   HGBA1C 7.0 (H) 06/24/2017   MICROALBUR 2.2 (H) 02/19/2017    Dg Chest 2 View  Result Date: 08/17/2012 *RADIOLOGY REPORT* Clinical Data: Preoperative evaluation for Nissen fundoplication. Diabetes.  Nonsmoker.  No current chest complaints CHEST - 2 VIEW Comparison: 03/25/2010 Findings: Heart and mediastinal contours are within normal limits. The lung fields appear clear with no signs of focal infiltrate or congestive failure.  No pleural fluid or significant peribronchial cuffing is seen. Bony structures appear intact. IMPRESSION: Stable cardiopulmonary appearance with no new focal or acute abnormality identified. Original Report Authenticated By: Rhodia Albrightebecca Kennedy, M.D.    Assessment & Plan:   Neizan was seen today for diabetes.  Diagnoses and all orders for this visit:  Type 2 diabetes mellitus with complication, without long-term current use of insulin (HCC)- his A1c is at 7.0%.  His blood sugars are adequately well controlled on the current dose of metformin. -     Basic metabolic panel; Future -     Hemoglobin A1c; Future  Other orders -     Zoster Vaccine Adjuvanted [facility](SHINGRIX) injection; Inject 0.5 mLs once for 1 dose into the muscle. -     Pneumococcal polysaccharide vaccine 23-valent greater than or equal to 2yo subcutaneous/IM   I have discontinued [name]'s cephALEXin. I am also having him start on Zoster Vaccine Adjuvanted. Additionally, I am having him maintain his glucose blood, rosuvastatin, aspirin EC, ONETOUCH DELICA LANCETS 33G, and metFORMIN.  Meds ordered this encounter  Medications  . Zoster Vaccine Adjuvanted [facility](SHINGRIX) injection    Sig: Inject 0.5 mLs once for 1 dose into the muscle.    Dispense:  0.5 mL    Refill:  1     Follow-up: Return in about 4 months (around 10/22/2017).  Sanda Lingerhomas Orie Baxendale, MD

## 2017-06-25 ENCOUNTER — Encounter: Payer: Self-pay | Admitting: Internal Medicine

## 2017-07-28 ENCOUNTER — Telehealth: Payer: Self-pay | Admitting: *Deleted

## 2017-07-28 NOTE — Telephone Encounter (Signed)
LMVM for pt that cancell appt due to weather and please cb to r/s.

## 2017-07-29 ENCOUNTER — Ambulatory Visit: Payer: Self-pay | Admitting: Nurse Practitioner

## 2017-07-29 DIAGNOSIS — K080 Exfoliation of teeth due to systemic causes: Secondary | ICD-10-CM | POA: Diagnosis not present

## 2017-08-20 NOTE — Telephone Encounter (Signed)
Called pt because he needed to be seen by jan 10 for insurance purposes for his initial cpap f/u. I have placed him on the schedule for Monday at 9:30 am on jan 7. Pt verbalized understanding.

## 2017-08-23 ENCOUNTER — Encounter: Payer: Self-pay | Admitting: Neurology

## 2017-08-24 ENCOUNTER — Ambulatory Visit (INDEPENDENT_AMBULATORY_CARE_PROVIDER_SITE_OTHER): Payer: 59 | Admitting: Neurology

## 2017-08-24 ENCOUNTER — Encounter: Payer: Self-pay | Admitting: Neurology

## 2017-08-24 VITALS — BP 141/86 | HR 82 | Ht 66.0 in | Wt 193.0 lb

## 2017-08-24 DIAGNOSIS — G43011 Migraine without aura, intractable, with status migrainosus: Secondary | ICD-10-CM

## 2017-08-24 DIAGNOSIS — G44019 Episodic cluster headache, not intractable: Secondary | ICD-10-CM | POA: Diagnosis not present

## 2017-08-24 DIAGNOSIS — G4733 Obstructive sleep apnea (adult) (pediatric): Secondary | ICD-10-CM | POA: Diagnosis not present

## 2017-08-24 DIAGNOSIS — Z9989 Dependence on other enabling machines and devices: Secondary | ICD-10-CM | POA: Diagnosis not present

## 2017-08-24 NOTE — Patient Instructions (Signed)

## 2017-08-24 NOTE — Progress Notes (Signed)
SLEEP MEDICINE CLINIC   Provider:  Melvyn Novas, M D  Primary Care Physician:  Etta Grandchild, MD   Referring Provider: Dentist Dr. Robbie Lis  @ [facility].   Chief Complaint  Patient presents with  . Follow-up    pt alone, rm 10. pt is using CPAP is working well. DME [facility]    HPI: Interval history from 24 August 2017, I had the pleasure of meeting with Nathan Park today who was diagnosed by a home sleep study on 27 April 2017 with mild obstructive sleep apnea and moderate snoring.  His AHI was 11.8/h, RDI was 13.8 oxygen nadir 78% SPO2 and he only had 10 minutes of hypoxemia.  Due to the high levels of daytime sleepiness I recommended CPAP but he could also use a dental device should CPAP not agree with him and weight loss was encouraged. Nathan Park was referred by his dentist after all.  He has been using his machine for 24 out of the last 30 days, but only 57% of the time was more than 4 hours of daily use.  He is currently on an AutoSet between 5 and 12 cmH2O was 1 cm EPR, his residual AHI is 5.0 he does have a much smaller number of central apneas.  At this time I feel that his autotitrator is covering his needs.  The 95th percentile pressure is 9.7 cm water.  He does have significant air leaks.  He has now developed a habit of shaving so that the machine has a air seal, he also has noticed that air leaking will wake him up. He decided to stay with CPAP instead of a dental device. I have explained how the dental - mandible advancement- can work for him.      Nathan Park is a 52 y.o. male , seen here as in a referral from Dr. Earlene Park, DDS, for a sleep evaluation, Chief complaint according to patient : Nathan Park is seen here today as a referral by his dentist Dr. Robbie Lis, he has a chief concern of being excessively daytime sleepy, drifting off to sleep whenever her situation allows. He finds himself falling asleep and physically not active and mentally not  stimulated, such as in some business related meetings, watching TV, reading and maybe even listening to a conversation. His wife and daughter have advised him that he snores loudly, and only recently did she realize that he drinks exorbitant amounts of caffeine.His most difficult time to stay awake this afternoon lunch. He has been witnessed not just to snore but also to have apnea, snores on a regular basis, feels often tired and not refreshed in the morning and has frequent headaches, he also has GERD.   Sleep habits are as follows: The patient's bedtime is between 11 PM and midnight, with an elevated head of bed, he shares a bedroom with his wife. The couple uses a fan for white noise in the background, the bedroom is described as cool and dark. He prefers a supine sleep position, but his wife has noticed that she snores the loudest. When she not just him and he tries to sleep on his side he scoots down the elevated part of the mattress. He usually has to go to the bathroom around 4 AM, can go back to sleep and sleeps until he has to rise in the morning at 6 AM. He wakes up spontaneously before his alarm rings. His wife rises later than him at about 7.  He has his biggest meal of the day in the morning with breakfast and compresses grits, egg steak, coffee.  Sleep medical history and family sleep history: Nathan Park is a diabetic, diagnosed in the year 2012. He endorsed frequent sinus trouble, sometimes hearing difficulties and he has gained weight over the last 24 month. He endorsed 19 points and the Epworth sleepiness score at his dentist's office. 3 points for all questions except question #6 and 8 at one point, a #7 at 2 points. His most difficult time to stay awake this afternoon lunch. He has been witnessed not just to snore but also to have apnea, snores on a regular basis, feels often tired and not refreshed in the morning and has frequent headaches, he also has GERD/ hiatal hernia.  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 He works on a  Anim[employer] , Copybuilding services- designing interiors.   3-4 cups of coffee in AM, at work he drinks 4- 6 cups more before noon, He drinks 2 bottled of wine a week. No tobacco use- Ever    Review of Systems: Out of a complete 14 system review, the patient complains of only the following symptoms, and all other reviewed systems are negative. Morning headaches, severe- GERD, alcohol user, caffeine user.  Palpitations, sleepiness, fatigue .witnessed apnea and crescendo snoring.   Epworth score  5 from 17 prior to CPAP use- he changed form nasal pillows to a gel mask, FFM.    Fatigue severity score:  now on CPAP 25 points from 47  , He feels well on CPAP- he will need a new FFM.   Social History   Socioeconomic History  . Marital status: Married    Spouse name: Not on file  . Number of children: 4  . Years of education: Not on file  . Highest education level: Not on file  Social Needs  . Financial resource strain: Not on file  . Food insecurity - worry: Not on file  . Food insecurity - inability: Not on file  . Transportation needs - medical: Not on file  . Transportation needs - non-medical: Not on file  Occupational History    Employer: [employer]  Tobacco Use  . Smoking status: Never Smoker  . Smokeless tobacco: Never Used  Substance and Sexual Activity  . Alcohol use: Yes    Alcohol/week: 3.0 oz    Types: 3 Glasses of wine, 2 Shots of liquor per week    Comment: 2 drinks a week   . Drug use: No  . Sexual activity: Yes  Other Topics Concern  . Not on file  Social History Narrative   No caffeine drinks     Family History  Problem Relation Age of Onset  . Diabetes Father   . Colon cancer Neg Hx   . Cancer Neg Hx   . Heart disease Neg Hx   . Hypertension Neg Hx   . Kidney disease Neg Hx     Past Medical History:  Diagnosis Date  . Barrett esophagus 01/31/10   at 32 cm  . Complication of anesthesia     slow to wake up  . Diabetes mellitus   . Diverticulosis   . GERD (gastroesophageal reflux disease)   . Headache(784.0)   . Hemorrhoids   . Hiatal hernia   . Hiatal hernia   . Nasal congestion   . Rectal pain   . Refusal of blood transfusions as patient is Jehovah's Witness   .  Umbilical hernia     Past Surgical History:  Procedure Laterality Date  . CARPAL TUNNEL RELEASE    . LAPAROSCOPIC NISSEN FUNDOPLICATION  08/25/2012   Procedure: LAPAROSCOPIC NISSEN FUNDOPLICATION;  Surgeon: Valarie Merino, MD;  Location: [facility];  Service: General;  Laterality: N/A;  . ORIF DISTAL RADIUS FRACTURE  2009  . repair umblical hernia repair  04/01/10  . sigmoid colectomy  04/01/10  . take down of colovesical fistula  04/01/10  . takedown of enterovesical fistula  04/01/10  . WRIST SURGERY     left    Current Outpatient Medications  Medication Sig Dispense Refill  . aspirin EC 81 MG tablet Take 1 tablet (81 mg total) by mouth daily. 90 tablet 3  . CINNAMON PO Take 2 tablets by mouth daily.    Marland Kitchen glucose blood (ONE TOUCH ULTRA TEST) test strip Use as instructed 100 each 12  . metFORMIN (GLUCOPHAGE-XR) 750 MG 24 hr tablet Take 2 tablets (1,500 mg total) by mouth daily with breakfast. 180 tablet 1  . Multiple Vitamin (MULTIVITAMIN) tablet Take 1 tablet by mouth daily.    Marland Kitchen omega-3 acid ethyl esters (LOVAZA) 1 g capsule Take 2 g by mouth 2 (two) times daily.    [name] Test up to BID. DX: E11.9 200 each 4  . rosuvastatin (CRESTOR) 5 MG tablet Take 1 tablet (5 mg total) by mouth daily. 90 tablet 3   No current facility-administered medications for this visit.     Allergies as of 08/24/2017 - Review Complete 08/24/2017  Allergen Reaction Noted  . Morphine and related  08/17/2012  . Oxycodone hcl Itching and Nausea Only 05/07/2011    Vitals: BP (!) 141/86   Pulse 82   Ht 5\' 6"  (1.676 m)   Wt 193 lb (87.5 kg)   BMI 31.15 kg/m  Last Weight:  Wt Readings from Last 1  Encounters:  08/24/17 193 lb (87.5 kg)   ZOX:WRUE mass index is 31.15 kg/m.     Last Height:   Ht Readings from Last 1 Encounters:  08/24/17 5\' 6"  (1.676 m)    Physical exam:  General: The patient is awake, alert and appears not in acute distress. The patient is well groomed. Head: Normocephalic, atraumatic. Neck is supple. Mallampati 2,  neck circumference:16. Nasal airflow congested, left nostril is blocked. Harlow Asa is seen.  Cardiovascular:  Regular rate and rhythm , without  murmurs or carotid bruit, and without distended neck veins. Respiratory: Lungs are clear to auscultation. Skin:  Without evidence of edema, or rash Trunk: BMI is 30. The patient's posture is erect  Neurologic exam : The patient is awake and alert, oriented to place and time.  Attention span & concentration ability appears normal. Speech is fluent,  without dysarthria, dysphonia or aphasia. Mood and affect are appropriate.  Cranial nerves: Pupils are equal and briskly reactive to light. No pallor or swelling of optic disc.  Extraocular movements  in vertical and horizontal planes intact and without nystagmus. Visual fields by finger perimetry are intact.Hearing to finger rub intact. Facial sensation intact to fine touch. Facial motor strength is symmetric , and his  tongue and uvula move midline.    Motor exam:  Normal tone, muscle bulk and symmetric strength in all extremities- without evidence of ataxia, dysmetria or tremor. Gait and station: Patient walks without assistive device - Strength within normal limits.Deep tendon reflexes: in the  upper and lower extremities are symmetric and intact.  Nathan Park suffers from excessive daytime sleepiness, a high degree of daytime fatigue, in spite of using a significant amount of caffeine each night. He also drinks alcohol regularly in the evening hours. He does not consider his sleep fragmented. And he usually gets only 6 hours or less of nocturnal sleep, sleep  deprivation is evident. His comorbidities of diabetes and GERD also will affect his sleep, the frequency of nocturia, the narrowing of the upper airway, and his sleep position to avoid acid reflux. He sleeps best with the head of bed elevated in supine but he snorts the loudest in this position. He also wakes up with morning headaches that resolve by themselves. These could be a sign of CO2 retention, it could also be caffeine withdrawl in the morning.  Since Nathan Park is referred by Dr. Earlene Park, DDS, my special attention will be to determine if he is a dental device candidate or in need of CPAP>   Assessment:  After physical and neurologic examination, review of laboratory studies,  Personal review of imaging studies, reports of other /same  Imaging studies, results of polysomnography and / or neurophysiology testing and pre-existing records as far as provided in visit., my assessment is   1)  OSA confirmed by HST, mild degree. CPAP tolerant and he feels good using the FFM. He is much less sleepy and fatigued and takes no daytime naps.   2)  no longer migraine and less frequent Sinus headaches since being on CPAP. He uses saline Nasal Spray with some improvement.  He suffers less frequent from CLUSTER headaches on CPAP . Was worse on Zyrtec D- he stopped using the decongestant.  He will go for a new opy tician evaluation- may be poor glasses contribute to his headaches.    3) He has not very good CPAP compliance. Will need to change gel FFM to a memory foam lined model- I suggest air touch. Discussed sleep hygiene and compliance.  A minimum time of 4 hours nightly use on average.  4) improved HBA1c since being on CPAP and sleeping longer, deeper and overall better !    Sleep hygiene rules given and explained- advance bedtime to before midnight. Alcohol not after 8 PM, one glass only, caffeine only 2 cups , only in AM- stop at lunchtime.  Nasal spray before CPAP use, changed mask to air-touch.    The patient was advised of the nature of the diagnosed disorder , the treatment options and the  risks for general health and wellness arising from not treating the condition.   I spent more than 45 minutes of face to face time with the patient.  Greater than 50% of time was spent in counseling and coordination of care. We have discussed the diagnosis and differential and I answered the patient's questions.    Rv with me in 6 month.  Keep a Cluster headache diary, sleep journal.  Compliance to CPAP will be reviewed.      Melvyn Novas, MD 08/24/2017, 9:53 AM  Certified in Neurology by ABPN Certified in Sleep Medicine by [facility] [address] [place], [place] [address]

## 2017-09-11 DIAGNOSIS — M9908 Segmental and somatic dysfunction of rib cage: Secondary | ICD-10-CM | POA: Diagnosis not present

## 2017-09-11 DIAGNOSIS — M791 Myalgia, unspecified site: Secondary | ICD-10-CM | POA: Diagnosis not present

## 2017-09-11 DIAGNOSIS — M5414 Radiculopathy, thoracic region: Secondary | ICD-10-CM | POA: Diagnosis not present

## 2017-09-11 DIAGNOSIS — M9902 Segmental and somatic dysfunction of thoracic region: Secondary | ICD-10-CM | POA: Diagnosis not present

## 2017-09-14 DIAGNOSIS — G4733 Obstructive sleep apnea (adult) (pediatric): Secondary | ICD-10-CM | POA: Diagnosis not present

## 2017-09-22 ENCOUNTER — Ambulatory Visit: Payer: Self-pay | Admitting: Nurse Practitioner

## 2017-09-25 DIAGNOSIS — M791 Myalgia, unspecified site: Secondary | ICD-10-CM | POA: Diagnosis not present

## 2017-09-25 DIAGNOSIS — M9902 Segmental and somatic dysfunction of thoracic region: Secondary | ICD-10-CM | POA: Diagnosis not present

## 2017-09-25 DIAGNOSIS — M9908 Segmental and somatic dysfunction of rib cage: Secondary | ICD-10-CM | POA: Diagnosis not present

## 2017-09-25 DIAGNOSIS — M5414 Radiculopathy, thoracic region: Secondary | ICD-10-CM | POA: Diagnosis not present

## 2017-09-27 DIAGNOSIS — G4733 Obstructive sleep apnea (adult) (pediatric): Secondary | ICD-10-CM | POA: Diagnosis not present

## 2017-10-09 DIAGNOSIS — M9908 Segmental and somatic dysfunction of rib cage: Secondary | ICD-10-CM | POA: Diagnosis not present

## 2017-10-09 DIAGNOSIS — M791 Myalgia, unspecified site: Secondary | ICD-10-CM | POA: Diagnosis not present

## 2017-10-09 DIAGNOSIS — M9902 Segmental and somatic dysfunction of thoracic region: Secondary | ICD-10-CM | POA: Diagnosis not present

## 2017-10-09 DIAGNOSIS — M5414 Radiculopathy, thoracic region: Secondary | ICD-10-CM | POA: Diagnosis not present

## 2017-10-20 ENCOUNTER — Encounter: Payer: Self-pay | Admitting: Internal Medicine

## 2017-10-25 DIAGNOSIS — G4733 Obstructive sleep apnea (adult) (pediatric): Secondary | ICD-10-CM | POA: Diagnosis not present

## 2017-10-30 DIAGNOSIS — M791 Myalgia, unspecified site: Secondary | ICD-10-CM | POA: Diagnosis not present

## 2017-10-30 DIAGNOSIS — M9902 Segmental and somatic dysfunction of thoracic region: Secondary | ICD-10-CM | POA: Diagnosis not present

## 2017-10-30 DIAGNOSIS — M5414 Radiculopathy, thoracic region: Secondary | ICD-10-CM | POA: Diagnosis not present

## 2017-10-30 DIAGNOSIS — M9908 Segmental and somatic dysfunction of rib cage: Secondary | ICD-10-CM | POA: Diagnosis not present

## 2017-11-25 DIAGNOSIS — G4733 Obstructive sleep apnea (adult) (pediatric): Secondary | ICD-10-CM | POA: Diagnosis not present

## 2017-12-06 ENCOUNTER — Other Ambulatory Visit: Payer: Self-pay | Admitting: Internal Medicine

## 2017-12-06 DIAGNOSIS — E118 Type 2 diabetes mellitus with unspecified complications: Secondary | ICD-10-CM

## 2017-12-25 DIAGNOSIS — G4733 Obstructive sleep apnea (adult) (pediatric): Secondary | ICD-10-CM | POA: Diagnosis not present

## 2018-01-03 ENCOUNTER — Other Ambulatory Visit: Payer: Self-pay | Admitting: Internal Medicine

## 2018-01-03 DIAGNOSIS — E118 Type 2 diabetes mellitus with unspecified complications: Secondary | ICD-10-CM

## 2018-01-12 ENCOUNTER — Encounter: Payer: Self-pay | Admitting: Internal Medicine

## 2018-01-12 ENCOUNTER — Ambulatory Visit: Payer: Federal, State, Local not specified - PPO | Admitting: Internal Medicine

## 2018-01-12 ENCOUNTER — Other Ambulatory Visit (INDEPENDENT_AMBULATORY_CARE_PROVIDER_SITE_OTHER): Payer: Federal, State, Local not specified - PPO

## 2018-01-12 VITALS — BP 130/80 | HR 64 | Temp 98.1°F | Resp 16 | Ht 66.0 in | Wt 192.5 lb

## 2018-01-12 DIAGNOSIS — Z0000 Encounter for general adult medical examination without abnormal findings: Secondary | ICD-10-CM | POA: Diagnosis not present

## 2018-01-12 DIAGNOSIS — E785 Hyperlipidemia, unspecified: Secondary | ICD-10-CM

## 2018-01-12 DIAGNOSIS — N5201 Erectile dysfunction due to arterial insufficiency: Secondary | ICD-10-CM

## 2018-01-12 DIAGNOSIS — Z0184 Encounter for antibody response examination: Secondary | ICD-10-CM

## 2018-01-12 DIAGNOSIS — E118 Type 2 diabetes mellitus with unspecified complications: Secondary | ICD-10-CM | POA: Diagnosis not present

## 2018-01-12 LAB — URINALYSIS, ROUTINE W REFLEX MICROSCOPIC
Bilirubin Urine: NEGATIVE
HGB URINE DIPSTICK: NEGATIVE
Ketones, ur: NEGATIVE
Leukocytes, UA: NEGATIVE
Nitrite: NEGATIVE
RBC / HPF: NONE SEEN (ref 0–?)
SPECIFIC GRAVITY, URINE: 1.02 (ref 1.000–1.030)
Total Protein, Urine: NEGATIVE
URINE GLUCOSE: NEGATIVE
Urobilinogen, UA: 0.2 (ref 0.0–1.0)
pH: 7.5 (ref 5.0–8.0)

## 2018-01-12 LAB — BASIC METABOLIC PANEL
BUN: 18 mg/dL (ref 6–23)
CO2: 29 meq/L (ref 19–32)
Calcium: 9.4 mg/dL (ref 8.4–10.5)
Chloride: 103 mEq/L (ref 96–112)
Creatinine, Ser: 0.92 mg/dL (ref 0.40–1.50)
GFR: 91.14 mL/min (ref >60.00)
GLUCOSE: 119 mg/dL — AB (ref 70–99)
POTASSIUM: 4 meq/L (ref 3.5–5.1)
SODIUM: 140 meq/L (ref 135–145)

## 2018-01-12 LAB — MICROALBUMIN / CREATININE URINE RATIO
Creatinine,U: 100.6 mg/dL
MICROALB UR: 1 mg/dL (ref 0.0–1.9)
Microalb Creat Ratio: 1 mg/g (ref 0.0–30.0)

## 2018-01-12 LAB — LIPID PANEL
CHOL/HDL RATIO: 4
Cholesterol: 151 mg/dL (ref 0–200)
HDL: 37 mg/dL — AB (ref >39.00)
NonHDL: 114.19
Triglycerides: 242 mg/dL — ABNORMAL HIGH (ref 0.0–149.0)
VLDL: 48.4 mg/dL — ABNORMAL HIGH (ref 0.0–40.0)

## 2018-01-12 LAB — POCT GLYCOSYLATED HEMOGLOBIN (HGB A1C): HEMOGLOBIN A1C: 7.7 % — AB (ref 4.0–5.6)

## 2018-01-12 LAB — LDL CHOLESTEROL, DIRECT: LDL DIRECT: 84 mg/dL

## 2018-01-12 MED ORDER — DAPAGLIFLOZIN PROPANEDIOL 5 MG PO TABS: 5.0000 mg | ORAL_TABLET | Freq: Every day | ORAL | 1 refills | Status: DC

## 2018-01-12 MED ORDER — SILDENAFIL CITRATE 20 MG PO TABS: 80.0000 mg | ORAL_TABLET | Freq: Every day | ORAL | 11 refills | Status: DC | PRN

## 2018-01-12 MED ORDER — METFORMIN HCL ER 750 MG PO TB24: ORAL_TABLET | ORAL | 1 refills | Status: DC

## 2018-01-12 NOTE — Progress Notes (Signed)
Subjective:  Patient ID: Nathan Park, male    DOB: [birth year]  Age: 52 y.o. MRN: [id number]  CC: Hyperlipidemia; Diabetes; and Annual Exam   HPI Nathan Park presents for a CPX.  His blood sugars have been a little higher than usual.  He admits to some recent dietary indiscretions and has not been able to lose weight.  He denies polys.  He is concerned about the measles outbreak in [place] and wants to have his measles titer checked.  He also complains of ED and wants to try a generic version of Viagra.  Outpatient Medications Prior to Visit  Medication Sig Dispense Refill  . aspirin EC 81 MG tablet Take 1 tablet (81 mg total) by mouth daily. 90 tablet 3  . CINNAMON PO Take 2 tablets by mouth daily.    Marland Kitchen glucose blood (ONE TOUCH ULTRA TEST) test strip Use as instructed 100 each 12  . Multiple Vitamin (MULTIVITAMIN) tablet Take 1 tablet by mouth daily.    Marland Kitchen omega-3 acid ethyl esters (LOVAZA) 1 g capsule Take 2 g by mouth 2 (two) times daily.    Nathan Park LANCETS 33G MISC Test up to BID. DX: E11.9 200 each 4  . rosuvastatin (CRESTOR) 5 MG tablet Take 1 tablet (5 mg total) by mouth daily. 90 tablet 3  . metFORMIN (GLUCOPHAGE-XR) 750 MG 24 hr tablet TAKE 2 TABLETS BY MOUTH WITH BREAKFAST 60 tablet 1   No facility-administered medications prior to visit.     ROS Review of Systems  Constitutional: Negative.  Negative for diaphoresis, fatigue and unexpected weight change.  HENT: Negative.   Eyes: Negative.   Respiratory: Negative.  Negative for cough, chest tightness, shortness of breath and wheezing.   Cardiovascular: Negative for chest pain, palpitations and leg swelling.  Gastrointestinal: Negative for abdominal pain, constipation, diarrhea, nausea and vomiting.  Endocrine: Negative.  Negative for polydipsia, polyphagia and polyuria.  Genitourinary: Negative.  Negative for difficulty urinating, dysuria, hematuria, penile swelling, scrotal swelling, testicular pain and  urgency.       ++ED  Musculoskeletal: Negative.  Negative for arthralgias and myalgias.  Skin: Negative.   Allergic/Immunologic: Negative.   Neurological: Negative.  Negative for dizziness, weakness and light-headedness.  Hematological: Negative for adenopathy. Does not bruise/bleed easily.  Psychiatric/Behavioral: Negative.     Objective:  BP 130/80 (BP Location: Left Arm, Patient Position: Sitting, Cuff Size: Normal)   Pulse 64   Temp 98.1 F (36.7 C) (Oral)   Resp 16   Ht  (1.676 m)   Wt 192 lb 8 oz (87.3 kg)   SpO2 97%   BMI 31.07 kg/m   BP Readings from Last 3 Encounters:  01/12/18 130/80  08/24/17 (!) 141/86  06/24/17 118/80    Wt Readings from Last 3 Encounters:  01/12/18 192 lb 8 oz (87.3 kg)  08/24/17 193 lb (87.5 kg)  06/24/17 187 lb 12 oz (85.2 kg)    Physical Exam  Constitutional: He is oriented to person, place, and time. No distress.  HENT:  Mouth/Throat: Oropharynx is clear and moist. No oropharyngeal exudate.  Eyes: Conjunctivae are normal. No scleral icterus.  Neck: Normal range of motion. Neck supple. No JVD present. No thyromegaly present.  Cardiovascular: Normal rate, regular rhythm and normal heart sounds. Exam reveals no gallop.  No murmur heard. Pulmonary/Chest: Effort normal and breath sounds normal. He has no wheezes. He has no rales.  Abdominal: Normal appearance and bowel sounds are normal. There is no hepatosplenomegaly.  There is no tenderness. Hernia confirmed negative in the right inguinal area and confirmed negative in the left inguinal area.  Genitourinary: Rectum normal, testes normal and penis normal. Rectal exam shows no internal hemorrhoid, no fissure and anal tone normal. Prostate is enlarged. Prostate is not tender. Right testis shows no mass, no swelling and no tenderness. Left testis shows no mass, no swelling and no tenderness. Circumcised. No penile erythema or penile tenderness. No discharge found.  Musculoskeletal: Normal  range of motion. He exhibits no edema, tenderness or deformity.  Lymphadenopathy:    He has no cervical adenopathy. No inguinal adenopathy noted on the right or left side.  Neurological: He is alert and oriented to person, place, and time.  Skin: Skin is warm and dry. No rash noted. He is not diaphoretic. No erythema.  Vitals reviewed.   Lab Results  Component Value Date   WBC 7.4 04/19/2016   HGB 15.4 04/19/2016   HCT 45 04/19/2016   PLT 236 04/19/2016   GLUCOSE 119 (H) 01/12/2018   CHOL 151 01/12/2018   TRIG 242.0 (H) 01/12/2018   HDL 37.00 (L) 01/12/2018   LDLDIRECT 84.0 01/12/2018   LDLCALC 84 04/19/2016   ALT 24 04/19/2016   AST 19 04/19/2016   NA 140 01/12/2018   K 4.0 01/12/2018   CL 103 01/12/2018   CREATININE 0.92 01/12/2018   BUN 18 01/12/2018   CO2 29 01/12/2018   TSH 0.57 04/19/2016   PSA 4.0 04/19/2016   INR 1.08 03/25/2010   HGBA1C 7.7 (A) 01/12/2018   MICROALBUR 1.0 01/12/2018    Dg Chest 2 View  Result Date: 08/17/2012 *RADIOLOGY REPORT* Clinical Data: Preoperative evaluation for Nissen fundoplication. Diabetes.  Nonsmoker.  No current chest complaints CHEST - 2 VIEW Comparison: 03/25/2010 Findings: Heart and mediastinal contours are within normal limits. The lung fields appear clear with no signs of focal infiltrate or congestive failure.  No pleural fluid or significant peribronchial cuffing is seen. Bony structures appear intact. IMPRESSION: Stable cardiopulmonary appearance with no new focal or acute abnormality identified. Original Report Authenticated By: Rhodia Albright, M.D.    Assessment & Plan:   [name] was seen today for hyperlipidemia, diabetes and annual exam.  Diagnoses and all orders for this visit:  Hyperlipidemia LDL goal <70- He has achieved his LDL goal and is doing well on the statin. -     Cancel: Lipid panel; Future  Type 2 diabetes mellitus with complication, without long-term current use of insulin (HCC)- His A1c is up to 7.7%.   His blood sugars are not adequately well controlled.  He will continue metformin at the current dose.  In addition to better lifestyle modifications will also add an SGLT2 inhibitor. -     Basic metabolic panel; Future -     Urinalysis, Routine w reflex microscopic; Future -     Microalbumin / creatinine urine ratio; Future -     metFORMIN (GLUCOPHAGE-XR) 750 MG 24 hr tablet; TAKE 2 TABLETS BY MOUTH WITH BREAKFAST -     dapagliflozin propanediol (FARXIGA) 5 MG TABS tablet; Take 5 mg by mouth daily. -     POCT glycosylated hemoglobin (Hb A1C)  Routine general medical examination at a health care facility- Exam completed, labs reviewed, vaccines reviewed, screening for colon cancer is up-to-date, patient education material was given. -     Lipid panel; Future -     PSA, total and free; Future  Immunity to measles determined by serologic test -  Rubeola antibody IgG; Future  Erectile dysfunction due to arterial insufficiency -     sildenafil (REVATIO) 20 MG tablet; Take 4 tablets (80 mg total) by mouth daily as needed.   I am having Daman S. Deshazo start on sildenafil and dapagliflozin propanediol. I am also having him maintain his glucose blood, rosuvastatin, aspirin EC, ONETOUCH Park LANCETS 33G, CINNAMON PO, omega-3 acid ethyl esters, multivitamin, and metFORMIN.  Meds ordered this encounter  Medications  . sildenafil (REVATIO) 20 MG tablet    Sig: Take 4 tablets (80 mg total) by mouth daily as needed.    Dispense:  60 tablet    Refill:  11  . metFORMIN (GLUCOPHAGE-XR) 750 MG 24 hr tablet    Sig: TAKE 2 TABLETS BY MOUTH WITH BREAKFAST    Dispense:  180 tablet    Refill:  1  . dapagliflozin propanediol (FARXIGA) 5 MG TABS tablet    Sig: Take 5 mg by mouth daily.    Dispense:  90 tablet    Refill:  1     Follow-up: Return in about 4 months (around 05/15/2018).  Sanda Linger, MD

## 2018-01-12 NOTE — Patient Instructions (Signed)

## 2018-01-14 ENCOUNTER — Encounter: Payer: Self-pay | Admitting: Internal Medicine

## 2018-01-14 DIAGNOSIS — E785 Hyperlipidemia, unspecified: Secondary | ICD-10-CM

## 2018-01-14 DIAGNOSIS — E118 Type 2 diabetes mellitus with unspecified complications: Secondary | ICD-10-CM

## 2018-01-14 DIAGNOSIS — Z794 Long term (current) use of insulin: Secondary | ICD-10-CM

## 2018-01-14 LAB — PSA, TOTAL AND FREE
PSA, % FREE: 14 % — AB (ref >25)
PSA, Free: 0.4 ng/mL
PSA, Total: 2.8 ng/mL (ref <=4.0)

## 2018-01-14 LAB — PSA: PSA: 2.8

## 2018-01-14 LAB — RUBEOLA ANTIBODY IGG

## 2018-01-14 MED ORDER — ROSUVASTATIN CALCIUM 5 MG PO TABS: 5.0000 mg | ORAL_TABLET | Freq: Every day | ORAL | 3 refills | Status: DC

## 2018-01-20 ENCOUNTER — Encounter: Payer: Self-pay | Admitting: Internal Medicine

## 2018-01-25 DIAGNOSIS — G4733 Obstructive sleep apnea (adult) (pediatric): Secondary | ICD-10-CM | POA: Diagnosis not present

## 2018-02-16 ENCOUNTER — Encounter: Payer: Self-pay | Admitting: Adult Health

## 2018-02-22 ENCOUNTER — Encounter: Payer: Self-pay | Admitting: Adult Health

## 2018-02-22 ENCOUNTER — Ambulatory Visit: Payer: Federal, State, Local not specified - PPO | Admitting: Adult Health

## 2018-02-22 VITALS — BP 123/78 | HR 70 | Ht 66.0 in | Wt 191.0 lb

## 2018-02-22 DIAGNOSIS — G4733 Obstructive sleep apnea (adult) (pediatric): Secondary | ICD-10-CM

## 2018-02-22 DIAGNOSIS — Z9989 Dependence on other enabling machines and devices: Secondary | ICD-10-CM | POA: Diagnosis not present

## 2018-02-22 NOTE — Progress Notes (Signed)
PATIENT: Nathan Park DOB: April 07, 1966  REASON FOR VISIT: follow up HISTORY FROM: patient  HISTORY OF PRESENT ILLNESS: Today 02/22/18:  Nathan Park is a 52 year old male with a history of obstructive sleep apnea on CPAP.  He returns today for follow-up.  His CPAP download shows that he uses machine 23 out of 30 days for compliance of 77%.  He uses machine greater than 4 hours each night.  On average he uses his machine 6 hours and 17 minutes.  His residual AHI is 3.2 on 5 to 12 cm of water.  His leak in the 95th percentile is 36.1 L/min.  He states that the leak was waking him up as it made a whistling noise.  However he found a CPAP gel from [employer] and that has eliminated him from hearing the leak.  His Epworth sleepiness score 7.   He feels that the CPAP continues to work well for him.  He returns today for an evaluation.  HISTORY Interval history from 24 August 2017, I had the pleasure of meeting with Nathan Park today who was diagnosed by a home sleep study on 27 April 2017 with mild obstructive sleep apnea and moderate snoring.  His AHI was 11.8/h, RDI was 13.8 oxygen nadir 78% SPO2 and he only had 10 minutes of hypoxemia.  Due to the high levels of daytime sleepiness I recommended CPAP but he could also use a dental device should CPAP not agree with him and weight loss was encouraged. Nathan Park was referred by his dentist after all.  He has been using his machine for 24 out of the last 30 days, but only 57% of the time was more than 4 hours of daily use.  He is currently on an AutoSet between 5 and 12 cmH2O was 1 cm EPR, his residual AHI is 5.0 he does have a much smaller number of central apneas.  At this time I feel that his autotitrator is covering his needs.  The 95th percentile pressure is 9.7 cm water.  He does have significant air leaks.  He has now developed a habit of shaving so that the machine has a air seal, he also has noticed that air leaking will wake him up. He decided to  stay with CPAP instead of a dental device. I have explained how the dental - mandible advancement- can work for him.    REVIEW OF SYSTEMS: Out of a complete 14 system review of symptoms, the patient complains only of the following symptoms, and all other reviewed systems are negative.  See HPI  ALLERGIES: Allergies  Allergen Reactions  . Morphine And Related     "Does not work for me"  . Oxycodone Hcl Itching and Nausea Only    HOME MEDICATIONS: Outpatient Medications Prior to Visit  Medication Sig Dispense Refill  . aspirin EC 81 MG tablet Take 1 tablet (81 mg total) by mouth daily. 90 tablet 3  . CINNAMON PO Take 2 tablets by mouth daily.    . dapagliflozin propanediol (FARXIGA) 5 MG TABS tablet Take 5 mg by mouth daily. 90 tablet 1  . glucose blood (ONE TOUCH ULTRA TEST) test strip Use as instructed 100 each 12  . metFORMIN (GLUCOPHAGE-XR) 750 MG 24 hr tablet TAKE 2 TABLETS BY MOUTH WITH BREAKFAST 180 tablet 1  . Multiple Vitamin (MULTIVITAMIN) tablet Take 1 tablet by mouth daily.    Marland Kitchen. omega-3 acid ethyl esters (LOVAZA) 1 g capsule Take 2 g by mouth 2 (two)  times daily.    [name] Test up to BID. DX: E11.9 200 each 4  . rosuvastatin (CRESTOR) 5 MG tablet Take 1 tablet (5 mg total) by mouth daily. 90 tablet 3  . sildenafil (REVATIO) 20 MG tablet Take 4 tablets (80 mg total) by mouth daily as needed. 60 tablet 11   No facility-administered medications prior to visit.     PAST MEDICAL HISTORY: Past Medical History:  Diagnosis Date  . Barrett esophagus 01/31/10   at 32 cm  . Complication of anesthesia    slow to wake up  . Diabetes mellitus   . Diverticulosis   . GERD (gastroesophageal reflux disease)   . Headache(784.0)   . Hemorrhoids   . Hiatal hernia   . Hiatal hernia   . Nasal congestion   . Rectal pain   . Refusal of blood transfusions as patient is Jehovah's Witness   . Umbilical hernia     PAST SURGICAL HISTORY: Past Surgical  History:  Procedure Laterality Date  . CARPAL TUNNEL RELEASE    . LAPAROSCOPIC NISSEN FUNDOPLICATION  08/25/2012   Procedure: LAPAROSCOPIC NISSEN FUNDOPLICATION;  Surgeon: Valarie Merino, MD;  Location: [facility];  Service: General;  Laterality: N/A;  . ORIF DISTAL RADIUS FRACTURE  2009  . repair umblical hernia repair  04/01/10  . sigmoid colectomy  04/01/10  . take down of colovesical fistula  04/01/10  . takedown of enterovesical fistula  04/01/10  . WRIST SURGERY     left    FAMILY HISTORY: Family History  Problem Relation Age of Onset  . Diabetes Father   . Colon cancer Neg Hx   . Cancer Neg Hx   . Heart disease Neg Hx   . Hypertension Neg Hx   . Kidney disease Neg Hx     SOCIAL HISTORY: Social History   Socioeconomic History  . Marital status: Married    Spouse name: Not on file  . Number of children: 4  . Years of education: Not on file  . Highest education level: Not on file  Occupational History    Employer: [employer]  Social Needs  . Financial resource strain: Not on file  . Food insecurity:    Worry: Not on file    Inability: Not on file  . Transportation needs:    Medical: Not on file    Non-medical: Not on file  Tobacco Use  . Smoking status: Never Smoker  . Smokeless tobacco: Never Used  Substance and Sexual Activity  . Alcohol use: Yes    Alcohol/week: 3.0 oz    Types: 3 Glasses of wine, 2 Shots of liquor per week    Comment: 2 drinks a week   . Drug use: No  . Sexual activity: Yes  Lifestyle  . Physical activity:    Days per week: Not on file    Minutes per session: Not on file  . Stress: Not on file  Relationships  . Social connections:    Talks on phone: Not on file    Gets together: Not on file    Attends religious service: Not on file    Active member of club or organization: Not on file    Attends meetings of clubs or organizations: Not on file    Relationship status: Not on file  . Intimate partner violence:    Fear of current or  ex partner: Not on file    Emotionally abused: Not on file  Physically abused: Not on file    Forced sexual activity: Not on file  Other Topics Concern  . Not on file  Social History Narrative   No caffeine drinks       PHYSICAL EXAM  Vitals:   02/22/18 1346  BP: 123/78  Pulse: 70  Weight: 191 lb (86.6 kg)  Height: 5\' 6"  (1.676 m)   Body mass index is 30.83 kg/m.  Generalized: Well developed, in no acute distress   Neurological examination  Mentation: Alert oriented to time, place, history taking. Follows all commands speech and language fluent Cranial nerve II-XII: Pupils were equal round reactive to light. Extraocular movements were full, visual field were full on confrontational test. Facial sensation and strength were normal. Uvula tongue midline. Head turning and shoulder shrug  were normal and symmetric. Neck circumference 16.5 inches. Motor: The motor testing reveals 5 over 5 strength of all 4 extremities. Good symmetric motor tone is noted throughout.  Sensory: Sensory testing is intact to soft touch on all 4 extremities. No evidence of extinction is noted.  Coordination: Cerebellar testing reveals good finger-nose-finger and heel-to-shin bilaterally.  Gait and station: Gait is normal.  Reflexes: Deep tendon reflexes are symmetric and normal bilaterally.   DIAGNOSTIC DATA (LABS, IMAGING, TESTING) - I reviewed patient records, labs, notes, testing and imaging myself where available.  Lab Results  Component Value Date   WBC 7.4 04/19/2016   HGB 15.4 04/19/2016   HCT 45 04/19/2016   MCV 86.5 01/08/2015   PLT 236 04/19/2016      Component Value Date/Time   NA 140 01/12/2018 1520   NA 141 04/19/2016   K 4.0 01/12/2018 1520   CL 103 01/12/2018 1520   CO2 29 01/12/2018 1520   GLUCOSE 119 (H) 01/12/2018 1520   BUN 18 01/12/2018 1520   BUN 16 04/19/2016   CREATININE 0.92 01/12/2018 1520   CALCIUM 9.4 01/12/2018 1520   PROT 6.9 01/08/2015 1141   ALBUMIN 4.5  01/08/2015 1141   AST 19 04/19/2016   ALT 24 04/19/2016   ALKPHOS 41 01/08/2015 1141   BILITOT 0.5 01/08/2015 1141   GFRNONAA >90 08/27/2012 0407   GFRAA >90 08/27/2012 0407   Lab Results  Component Value Date   CHOL 151 01/12/2018   HDL 37.00 (L) 01/12/2018   LDLCALC 84 04/19/2016   LDLDIRECT 84.0 01/12/2018   TRIG 242.0 (H) 01/12/2018   CHOLHDL 4 01/12/2018   Lab Results  Component Value Date   HGBA1C 7.7 (A) 01/12/2018   No results found for: VITAMINB12 Lab Results  Component Value Date   TSH 0.57 04/19/2016      ASSESSMENT AND PLAN 52 y.o. year old male  has a past medical history of Barrett esophagus (01/31/10), Complication of anesthesia, Diabetes mellitus, Diverticulosis, GERD (gastroesophageal reflux disease), Headache(784.0), Hemorrhoids, Hiatal hernia, Hiatal hernia, Nasal congestion, Rectal pain, Refusal of blood transfusions as patient is Jehovah's Witness, and Umbilical hernia. here with:  1. Obstructive sleep apnea on CPAP  The patient CPAP download shows good compliance and treatment of his apnea.  He does have a leak but this is not interfering with his sleep or residual AHI.  He does not want to have a mask refitting at this time.  I have advised that if his symptoms worsen or he develops new symptoms he should let us know.  He will follow-up in 1 year or sooner if needed.  I spent 15 minutes with the patient. 50% of this time was spent reviewing  his CPAP download  Nathan Penny, MSN, NP-C 02/22/2018, 1:53 PM [facility] [address] [place], [place] [address] [phone]

## 2018-02-22 NOTE — Patient Instructions (Signed)
Your Plan:  Continue using CPAP nightly and >4 hours each night If mask starts to leak call us If your symptoms worsen or you develop new symptoms please let us know.       Thank you for coming to see [facility] at [facility]. I hope we have been able to provide you high quality care today.  You may receive a patient satisfaction survey over the next few weeks. We would appreciate your feedback and comments so that we may continue to improve ourselves and the health of our patients.

## 2018-02-24 DIAGNOSIS — G4733 Obstructive sleep apnea (adult) (pediatric): Secondary | ICD-10-CM | POA: Diagnosis not present

## 2018-03-01 ENCOUNTER — Encounter: Payer: Self-pay | Admitting: Internal Medicine

## 2018-03-02 ENCOUNTER — Other Ambulatory Visit: Payer: Self-pay | Admitting: Internal Medicine

## 2018-03-04 ENCOUNTER — Ambulatory Visit: Payer: Federal, State, Local not specified - PPO | Admitting: Family Medicine

## 2018-03-04 DIAGNOSIS — Z0289 Encounter for other administrative examinations: Secondary | ICD-10-CM

## 2018-03-04 NOTE — Progress Notes (Deleted)
Nathan Park - 52 y.o. male MRN [id number]  Date of birth: 04-21-1966  SUBJECTIVE:  Including CC & ROS.  No chief complaint on file.   Nathan Park is a 52 y.o. male that is  ***.  ***   Review of Systems  HISTORY: Past Medical, Surgical, Social, and Family History Reviewed & Updated per EMR.   Pertinent Historical Findings include:  Past Medical History:  Diagnosis Date  . Barrett esophagus 01/31/10   at 32 cm  . Complication of anesthesia    slow to wake up  . Diabetes mellitus   . Diverticulosis   . GERD (gastroesophageal reflux disease)   . Headache(784.0)   . Hemorrhoids   . Hiatal hernia   . Hiatal hernia   . Nasal congestion   . Rectal pain   . Refusal of blood transfusions as patient is Jehovah's Witness   . Umbilical hernia     Past Surgical History:  Procedure Laterality Date  . CARPAL TUNNEL RELEASE    . LAPAROSCOPIC NISSEN FUNDOPLICATION  08/25/2012   Procedure: LAPAROSCOPIC NISSEN FUNDOPLICATION;  Surgeon: Valarie MerinoMatthew B Martin, MD;  Location: [facility];  Service: General;  Laterality: N/A;  . ORIF DISTAL RADIUS FRACTURE  2009  . repair umblical hernia repair  04/01/10  . sigmoid colectomy  04/01/10  . take down of colovesical fistula  04/01/10  . takedown of enterovesical fistula  04/01/10  . WRIST SURGERY     left    Allergies  Allergen Reactions  . Morphine And Related     "Does not work for me"  . Oxycodone Hcl Itching and Nausea Only    Family History  Problem Relation Age of Onset  . Diabetes Father   . Colon cancer Neg Hx   . Cancer Neg Hx   . Heart disease Neg Hx   . Hypertension Neg Hx   . Kidney disease Neg Hx      Social History   Socioeconomic History  . Marital status: Married    Spouse name: Not on file  . Number of children: 4  . Years of education: Not on file  . Highest education level: Not on file  Occupational History    Employer: [employer]  Social Needs  . Financial resource strain: Not on file  . Food insecurity:      Worry: Not on file    Inability: Not on file  . Transportation needs:    Medical: Not on file    Non-medical: Not on file  Tobacco Use  . Smoking status: Never Smoker  . Smokeless tobacco: Never Used  Substance and Sexual Activity  . Alcohol use: Yes    Alcohol/week: 3.0 oz    Types: 3 Glasses of wine, 2 Shots of liquor per week    Comment: 2 drinks a week   . Drug use: No  . Sexual activity: Yes  Lifestyle  . Physical activity:    Days per week: Not on file    Minutes per session: Not on file  . Stress: Not on file  Relationships  . Social connections:    Talks on phone: Not on file    Gets together: Not on file    Attends religious service: Not on file    Active member of club or organization: Not on file    Attends meetings of clubs or organizations: Not on file    Relationship status: Not on file  . Intimate partner violence:  Fear of current or ex partner: Not on file    Emotionally abused: Not on file    Physically abused: Not on file    Forced sexual activity: Not on file  Other Topics Concern  . Not on file  Social History Narrative   No caffeine drinks      PHYSICAL EXAM:  VS: There were no vitals taken for this visit. Physical Exam Gen: NAD, alert, cooperative with exam, well-appearing ENT: normal lips, normal nasal mucosa,  Eye: normal EOM, normal conjunctiva and lids CV:  no edema, +2 pedal pulses   Resp: no accessory muscle use, non-labored,  GI: no masses or tenderness, no hernia  Skin: no rashes, no areas of induration  Neuro: normal tone, normal sensation to touch Psych:  normal insight, alert and oriented MSK:  ***      ASSESSMENT & PLAN:   No problem-specific Assessment & Plan notes found for this encounter.

## 2018-03-09 DIAGNOSIS — K080 Exfoliation of teeth due to systemic causes: Secondary | ICD-10-CM | POA: Diagnosis not present

## 2018-03-10 ENCOUNTER — Encounter: Payer: Self-pay | Admitting: Internal Medicine

## 2018-03-17 LAB — COLOGUARD: COLOGUARD: NEGATIVE

## 2018-03-19 NOTE — Progress Notes (Signed)
I agree with the assessment and plan as directed by NP .The patient is known to me .   Corby Villasenor, MD  

## 2018-04-28 LAB — COLOGUARD: Cologuard: NEGATIVE

## 2018-05-05 DIAGNOSIS — E119 Type 2 diabetes mellitus without complications: Secondary | ICD-10-CM | POA: Diagnosis not present

## 2018-05-05 LAB — HM DIABETES EYE EXAM

## 2018-05-17 ENCOUNTER — Encounter: Payer: Self-pay | Admitting: Internal Medicine

## 2018-05-18 DIAGNOSIS — G4733 Obstructive sleep apnea (adult) (pediatric): Secondary | ICD-10-CM | POA: Diagnosis not present

## 2018-07-08 ENCOUNTER — Other Ambulatory Visit: Payer: Self-pay | Admitting: Internal Medicine

## 2018-07-08 DIAGNOSIS — E118 Type 2 diabetes mellitus with unspecified complications: Secondary | ICD-10-CM

## 2018-08-24 ENCOUNTER — Other Ambulatory Visit: Payer: Self-pay | Admitting: Internal Medicine

## 2018-08-24 DIAGNOSIS — E118 Type 2 diabetes mellitus with unspecified complications: Secondary | ICD-10-CM

## 2018-09-01 ENCOUNTER — Encounter: Payer: Self-pay | Admitting: Internal Medicine

## 2018-09-01 DIAGNOSIS — E118 Type 2 diabetes mellitus with unspecified complications: Secondary | ICD-10-CM

## 2018-09-01 MED ORDER — METFORMIN HCL ER 750 MG PO TB24: ORAL_TABLET | ORAL | 0 refills | Status: DC

## 2018-09-03 DIAGNOSIS — G4733 Obstructive sleep apnea (adult) (pediatric): Secondary | ICD-10-CM | POA: Diagnosis not present

## 2018-09-26 ENCOUNTER — Other Ambulatory Visit: Payer: Self-pay | Admitting: Internal Medicine

## 2018-09-26 DIAGNOSIS — E118 Type 2 diabetes mellitus with unspecified complications: Secondary | ICD-10-CM

## 2018-09-27 ENCOUNTER — Encounter: Payer: Self-pay | Admitting: Internal Medicine

## 2018-09-27 ENCOUNTER — Ambulatory Visit: Payer: Federal, State, Local not specified - PPO | Admitting: Internal Medicine

## 2018-09-27 ENCOUNTER — Other Ambulatory Visit (INDEPENDENT_AMBULATORY_CARE_PROVIDER_SITE_OTHER): Payer: Federal, State, Local not specified - PPO

## 2018-09-27 VITALS — BP 118/78 | HR 60 | Temp 98.0°F | Resp 16 | Ht 66.0 in | Wt 184.5 lb

## 2018-09-27 DIAGNOSIS — Z23 Encounter for immunization: Secondary | ICD-10-CM

## 2018-09-27 DIAGNOSIS — E118 Type 2 diabetes mellitus with unspecified complications: Secondary | ICD-10-CM

## 2018-09-27 DIAGNOSIS — E785 Hyperlipidemia, unspecified: Secondary | ICD-10-CM

## 2018-09-27 LAB — LIPID PANEL
Cholesterol: 105 mg/dL (ref 0–200)
HDL: 39.5 mg/dL (ref >39.00)
LDL Cholesterol: 49 mg/dL (ref 0–99)
NonHDL: 65.99
Total CHOL/HDL Ratio: 3
Triglycerides: 85 mg/dL (ref 0.0–149.0)
VLDL: 17 mg/dL (ref 0.0–40.0)

## 2018-09-27 LAB — BASIC METABOLIC PANEL
BUN: 19 mg/dL (ref 6–23)
CO2: 29 mEq/L (ref 19–32)
Calcium: 9.2 mg/dL (ref 8.4–10.5)
Chloride: 102 mEq/L (ref 96–112)
Creatinine, Ser: 1.01 mg/dL (ref 0.40–1.50)
GFR: 76.79 mL/min (ref >60.00)
Glucose, Bld: 116 mg/dL — ABNORMAL HIGH (ref 70–99)
Potassium: 3.9 mEq/L (ref 3.5–5.1)
SODIUM: 139 meq/L (ref 135–145)

## 2018-09-27 LAB — HEMOGLOBIN A1C: Hgb A1c MFr Bld: 7.2 % — ABNORMAL HIGH (ref 4.6–6.5)

## 2018-09-27 NOTE — Patient Instructions (Signed)
Type 2 Diabetes Mellitus, Diagnosis, Adult Type 2 diabetes (type 2 diabetes mellitus) is a long-term (chronic) disease. In type 2 diabetes, one or both of these problems may be present:  The pancreas does not make enough of a hormone called insulin.  Cells in the body do not respond properly to insulin that the body makes (insulin resistance). Normally, insulin allows blood sugar (glucose) to enter cells in the body. The cells use glucose for energy. Insulin resistance or lack of insulin causes excess glucose to build up in the blood instead of going into cells. As a result, high blood glucose (hyperglycemia) develops. What increases the risk? The following factors may make you more likely to develop type 2 diabetes:  Having a family member with type 2 diabetes.  Being overweight or obese.  Having an inactive (sedentary) lifestyle.  Having been diagnosed with insulin resistance.  Having a history of prediabetes, gestational diabetes, or polycystic ovary syndrome (PCOS).  Being of American-Indian, African-American, Hispanic/Latino, or Asian/Pacific Islander descent. What are the signs or symptoms? In the early stage of this condition, you may not have symptoms. Symptoms develop slowly and may include:  Increased thirst (polydipsia).  Increased hunger(polyphagia).  Increased urination (polyuria).  Increased urination during the night (nocturia).  Unexplained weight loss.  Frequent infections that keep coming back (recurring).  Fatigue.  Weakness.  Vision changes, such as blurry vision.  Cuts or bruises that are slow to heal.  Tingling or numbness in the hands or feet.  Dark patches on the skin (acanthosis nigricans). How is this diagnosed? This condition is diagnosed based on your symptoms, your medical history, a physical exam, and your blood glucose level. Your blood glucose may be checked with one or more of the following blood tests:  A fasting blood glucose (FBG)  test. You will not be allowed to eat (you will fast) for 8 hours or longer before a blood sample is taken.  A random blood glucose test. This test checks blood glucose at any time of day regardless of when you ate.  An A1c (hemoglobin A1c) blood test. This test provides information about blood glucose control over the previous 2-3 months.  An oral glucose tolerance test (OGTT). This test measures your blood glucose at two times: ? After fasting. This is your baseline blood glucose level. ? Two hours after drinking a beverage that contains glucose. You may be diagnosed with type 2 diabetes if:  Your FBG level is 126 mg/dL (7.0 mmol/L) or higher.  Your random blood glucose level is 200 mg/dL (11.1 mmol/L) or higher.  Your A1c level is 6.5% or higher.  Your OGTT result is higher than 200 mg/dL (11.1 mmol/L). These blood tests may be repeated to confirm your diagnosis. How is this treated? Your treatment may be managed by a specialist called an endocrinologist. Type 2 diabetes may be treated by following instructions from your health care provider about:  Making diet and lifestyle changes. This may include: ? Following an individualized nutrition plan that is developed by a diet and nutrition specialist (registered dietitian). ? Exercising regularly. ? Finding ways to manage stress.  Checking your blood glucose level as often as told.  Taking diabetes medicines or insulin daily. This helps to keep your blood glucose levels in the healthy range. ? If you use insulin, you may need to adjust the dosage depending on how physically active you are and what foods you eat. Your health care provider will tell you how to adjust your dosage.    Taking medicines to help prevent complications from diabetes, such as: ? Aspirin. ? Medicine to lower cholesterol. ? Medicine to control blood pressure. Your health care provider will set individualized treatment goals for you. Your goals will be based on  your age, other medical conditions you have, and how you respond to diabetes treatment. Generally, the goal of treatment is to maintain the following blood glucose levels:  Before meals (preprandial): 80-130 mg/dL (4.4-7.2 mmol/L).  After meals (postprandial): below 180 mg/dL (10 mmol/L).  A1c level: less than 7%. Follow these instructions at home: Questions to ask your health care provider  Consider asking the following questions: ? Do I need to meet with a diabetes educator? ? Where can I find a support group for people with diabetes? ? What equipment will I need to manage my diabetes at home? ? What diabetes medicines do I need, and when should I take them? ? How often do I need to check my blood glucose? ? What number can I call if I have questions? ? When is my next appointment? General instructions  Take over-the-counter and prescription medicines only as told by your health care provider.  Keep all follow-up visits as told by your health care provider. This is important.  For more information about diabetes, visit: ? American Diabetes Association (ADA): www.diabetes.org ? American Association of Diabetes Educators (AADE): www.diabeteseducator.org Contact a health care provider if:  Your blood glucose is at or above 240 mg/dL (13.3 mmol/L) for 2 days in a row.  You have been sick or have had a fever for 2 days or longer, and you are not getting better.  You have any of the following problems for more than 6 hours: ? You cannot eat or drink. ? You have nausea and vomiting. ? You have diarrhea. Get help right away if:  Your blood glucose is lower than 54 mg/dL (3.0 mmol/L).  You become confused or you have trouble thinking clearly.  You have difficulty breathing.  You have moderate or large ketone levels in your urine. Summary  Type 2 diabetes (type 2 diabetes mellitus) is a long-term (chronic) disease. In type 2 diabetes, the pancreas does not make enough of a  hormone called insulin, or cells in the body do not respond properly to insulin that the body makes (insulin resistance).  This condition is treated by making diet and lifestyle changes and taking diabetes medicines or insulin.  Your health care provider will set individualized treatment goals for you. Your goals will be based on your age, other medical conditions you have, and how you respond to diabetes treatment.  Keep all follow-up visits as told by your health care provider. This is important. This information is not intended to replace advice given to you by your health care provider. Make sure you discuss any questions you have with your health care provider. Document Released: 08/04/2005 Document Revised: 03/05/2017 Document Reviewed: 09/07/2015 Elsevier Interactive Patient Education  2019 [employer]  

## 2018-09-27 NOTE — Progress Notes (Signed)
Subjective:  Patient ID: Nathan Park, male    DOB: Jul 12, 1966  Age: 53 y.o. MRN: [id number]  CC: Diabetes and Hyperlipidemia   HPI Nathan Park presents for f/up - He has intentionally lost 7 pounds since I last saw him.  He runs about 2 miles a day with his dog and denies CP, DOE, palpitations, edema, or fatigue.  Outpatient Medications Prior to Visit  Medication Sig Dispense Refill  . aspirin EC 81 MG tablet Take 1 tablet (81 mg total) by mouth daily. 90 tablet 3  . CINNAMON PO Take 2 tablets by mouth daily.    Marland Kitchen glucose blood (ONE TOUCH ULTRA TEST) test strip Use as instructed 100 each 12  . Multiple Vitamin (MULTIVITAMIN) tablet Take 1 tablet by mouth daily.    Marland Kitchen omega-3 acid ethyl esters (LOVAZA) 1 g capsule Take 2 g by mouth 2 (two) times daily.    Nathan Park DELICA LANCETS 33G MISC Test up to BID. DX: E11.9 200 each 4  . rosuvastatin (CRESTOR) 5 MG tablet Take 1 tablet (5 mg total) by mouth daily. 90 tablet 3  . sildenafil (REVATIO) 20 MG tablet Take 4 tablets (80 mg total) by mouth daily as needed. 60 tablet 11  . FARXIGA 5 MG TABS tablet TAKE 1 TABLET BY MOUTH DAILY. 90 tablet 1  . metFORMIN (GLUCOPHAGE-XR) 750 MG 24 hr tablet TAKE 2 TABLETS BY MOUTH WITH BREAKFAST 60 tablet 0   No facility-administered medications prior to visit.     ROS Review of Systems  Constitutional: Negative for appetite change, diaphoresis, fatigue and unexpected weight change.  HENT: Negative.  Negative for trouble swallowing.   Respiratory: Negative for cough, chest tightness, shortness of breath and wheezing.   Cardiovascular: Negative for chest pain, palpitations and leg swelling.  Gastrointestinal: Negative for abdominal pain, constipation, diarrhea, nausea and vomiting.  Endocrine: Negative for polydipsia, polyphagia and polyuria.  Genitourinary: Negative.  Negative for difficulty urinating.  Musculoskeletal: Negative.  Negative for arthralgias, myalgias and neck pain.  Skin: Negative.     Neurological: Negative.  Negative for dizziness, weakness, light-headedness and headaches.  Hematological: Negative for adenopathy. Does not bruise/bleed easily.  Psychiatric/Behavioral: Negative.     Objective:  BP 118/78 (BP Location: Left Arm, Patient Position: Sitting, Cuff Size: Large)   Pulse 60   Temp 98 F (36.7 C) (Oral)   Resp 16   Ht 5\' 6"  (1.676 m)   Wt 184 lb 8 oz (83.7 kg)   SpO2 96%   BMI 29.78 kg/m   BP Readings from Last 3 Encounters:  09/27/18 118/78  02/22/18 123/78  01/12/18 130/80    Wt Readings from Last 3 Encounters:  09/27/18 184 lb 8 oz (83.7 kg)  02/22/18 191 lb (86.6 kg)  01/12/18 192 lb 8 oz (87.3 kg)    Physical Exam Vitals signs reviewed.  Constitutional:      Appearance: He is not ill-appearing or diaphoretic.  HENT:     Nose: Nose normal. No congestion.     Mouth/Throat:     Mouth: Mucous membranes are moist.     Pharynx: Oropharynx is clear. No oropharyngeal exudate or posterior oropharyngeal erythema.  Eyes:     General: No scleral icterus.    Conjunctiva/sclera: Conjunctivae normal.  Neck:     Musculoskeletal: Normal range of motion and neck supple. No neck rigidity or muscular tenderness.  Cardiovascular:     Rate and Rhythm: Normal rate and regular rhythm.     Pulses:  Normal pulses.     Heart sounds: No murmur. No gallop.   Pulmonary:     Effort: Pulmonary effort is normal. No respiratory distress.     Breath sounds: No stridor. No wheezing, rhonchi or rales.  Abdominal:     General: Bowel sounds are normal.     Palpations: There is no mass.     Tenderness: There is no abdominal tenderness. There is no guarding.  Musculoskeletal: Normal range of motion.        General: No swelling or deformity.     Right lower leg: No edema.     Left lower leg: No edema.  Lymphadenopathy:     Cervical: No cervical adenopathy.  Skin:    General: Skin is warm and dry.     Coloration: Skin is not jaundiced or pale.     Findings: No  bruising or lesion.  Neurological:     General: No focal deficit present.     Mental Status: He is oriented to person, place, and time. Mental status is at baseline.  Psychiatric:        Mood and Affect: Mood normal.        Behavior: Behavior normal.        Thought Content: Thought content normal.        Judgment: Judgment normal.     Lab Results  Component Value Date   WBC 7.4 04/19/2016   HGB 15.4 04/19/2016   HCT 45 04/19/2016   PLT 236 04/19/2016   GLUCOSE 116 (H) 09/27/2018   CHOL 105 09/27/2018   TRIG 85.0 09/27/2018   HDL 39.50 09/27/2018   LDLDIRECT 84.0 01/12/2018   LDLCALC 49 09/27/2018   ALT 24 04/19/2016   AST 19 04/19/2016   NA 139 09/27/2018   K 3.9 09/27/2018   CL 102 09/27/2018   CREATININE 1.01 09/27/2018   BUN 19 09/27/2018   CO2 29 09/27/2018   TSH 0.57 04/19/2016   PSA 2.8 01/14/2018   INR 1.08 03/25/2010   HGBA1C 7.2 (H) 09/27/2018   MICROALBUR 1.0 01/12/2018    Dg Chest 2 View  Result Date: 08/17/2012 *RADIOLOGY REPORT* Clinical Data: Preoperative evaluation for Nissen fundoplication. Diabetes.  Nonsmoker.  No current chest complaints CHEST - 2 VIEW Comparison: 03/25/2010 Findings: Heart and mediastinal contours are within normal limits. The lung fields appear clear with no signs of focal infiltrate or congestive failure.  No pleural fluid or significant peribronchial cuffing is seen. Bony structures appear intact. IMPRESSION: Stable cardiopulmonary appearance with no new focal or acute abnormality identified. Original Report Authenticated By: Nathan Park, M.D.    Assessment & Plan:   Nathan Park was seen today for diabetes and hyperlipidemia.  Diagnoses and all orders for this visit:  Type II diabetes mellitus with manifestations (HCC)- His A1c is lower at 7.2% but is still a little too high.  I have asked him to increase his Farxiga dose to 10 mg a day.  Will continue the current metformin dose.  He was praised for his lifestyle modifications. -      Basic metabolic panel; Future -     Hemoglobin A1c; Future -     metFORMIN (GLUCOPHAGE-XR) 750 MG 24 hr tablet; TAKE 2 TABLETS BY MOUTH WITH BREAKFAST -     dapagliflozin propanediol (FARXIGA) 10 MG TABS tablet; Take 10 mg by mouth daily.  Hyperlipidemia LDL goal <70- He has achieved his LDL goal and is doing well on the statin. -     Lipid panel;  Future  Need for immunization against influenza -     Flu Vaccine QUAD 36+ mos IM  Type 2 diabetes mellitus with complication, without long-term current use of insulin (HCC) -     metFORMIN (GLUCOPHAGE-XR) 750 MG 24 hr tablet; TAKE 2 TABLETS BY MOUTH WITH BREAKFAST -     dapagliflozin propanediol (FARXIGA) 10 MG TABS tablet; Take 10 mg by mouth daily.   I have discontinued Keylor S. Foisy's FARXIGA. I am also having him start on dapagliflozin propanediol. Additionally, I am having him maintain his glucose blood, aspirin EC, ONETOUCH DELICA LANCETS 33G, CINNAMON PO, omega-3 acid ethyl esters, multivitamin, sildenafil, rosuvastatin, and metFORMIN.  Meds ordered this encounter  Medications  . metFORMIN (GLUCOPHAGE-XR) 750 MG 24 hr tablet    Sig: TAKE 2 TABLETS BY MOUTH WITH BREAKFAST    Dispense:  180 tablet    Refill:  1  . dapagliflozin propanediol (FARXIGA) 10 MG TABS tablet    Sig: Take 10 mg by mouth daily.    Dispense:  90 tablet    Refill:  1     Follow-up: Return in about 6 months (around 03/28/2019).  Sanda Lingerhomas Alexey Rhoads, MD

## 2018-09-28 ENCOUNTER — Encounter: Payer: Self-pay | Admitting: Internal Medicine

## 2018-09-28 MED ORDER — DAPAGLIFLOZIN PROPANEDIOL 10 MG PO TABS: 10.0000 mg | ORAL_TABLET | Freq: Every day | ORAL | 1 refills | Status: DC

## 2018-09-28 MED ORDER — METFORMIN HCL ER 750 MG PO TB24: ORAL_TABLET | ORAL | 1 refills | Status: DC

## 2018-12-06 ENCOUNTER — Other Ambulatory Visit: Payer: Self-pay

## 2018-12-06 ENCOUNTER — Encounter: Payer: Self-pay | Admitting: Internal Medicine

## 2018-12-06 ENCOUNTER — Ambulatory Visit (INDEPENDENT_AMBULATORY_CARE_PROVIDER_SITE_OTHER)
Admission: RE | Admit: 2018-12-06 | Discharge: 2018-12-06 | Disposition: A | Discharge status: Home / Self Care | Payer: Federal, State, Local not specified - PPO | Source: Ambulatory Visit | Attending: Internal Medicine | Admitting: Internal Medicine

## 2018-12-06 ENCOUNTER — Ambulatory Visit: Payer: Federal, State, Local not specified - PPO | Admitting: Internal Medicine

## 2018-12-06 VITALS — BP 112/80 | HR 71 | Temp 98.0°F | Ht 66.0 in | Wt 185.0 lb

## 2018-12-06 DIAGNOSIS — M2392 Unspecified internal derangement of left knee: Secondary | ICD-10-CM

## 2018-12-06 DIAGNOSIS — M25562 Pain in left knee: Secondary | ICD-10-CM | POA: Diagnosis not present

## 2018-12-06 DIAGNOSIS — M7989 Other specified soft tissue disorders: Secondary | ICD-10-CM | POA: Diagnosis not present

## 2018-12-06 MED ORDER — MELOXICAM 15 MG PO TABS: 15.0000 mg | ORAL_TABLET | Freq: Every day | ORAL | 0 refills | Status: DC

## 2018-12-06 NOTE — Patient Instructions (Signed)

## 2018-12-06 NOTE — Progress Notes (Signed)
Subjective:  Patient ID: Nathan Park, male    DOB: 10-15-1965  Age: 53 y.o. MRN: [id number]  CC: Knee Pain   HPI Nathan Park presents for a 10 day hx of left knee pain.  He was cutting down a tree 10 days ago when he twisted his left knee.  He since then has had worsening soreness in the medial aspect of his left knee.  He describes the pain as sharp and stabbing.  He has tried Tylenol for the pain but has not received much benefit.  He has continued to do weightbearing activity without much difficulty.  The pain is worsened by laying down.  He denies knee instability, buckling, or locking.  Outpatient Medications Prior to Visit  Medication Sig Dispense Refill  . dapagliflozin propanediol (FARXIGA) 10 MG TABS tablet Take 10 mg by mouth daily. 90 tablet 1  . glucose blood (ONE TOUCH ULTRA TEST) test strip Use as instructed 100 each 12  . metFORMIN (GLUCOPHAGE-XR) 750 MG 24 hr tablet TAKE 2 TABLETS BY MOUTH WITH BREAKFAST 180 tablet 1  . ONETOUCH DELICA LANCETS 33G MISC Test up to BID. DX: E11.9 200 each 4  . rosuvastatin (CRESTOR) 5 MG tablet Take 1 tablet (5 mg total) by mouth daily. 90 tablet 3  . sildenafil (REVATIO) 20 MG tablet Take 4 tablets (80 mg total) by mouth daily as needed. 60 tablet 11  . Multiple Vitamin (MULTIVITAMIN) tablet Take 1 tablet by mouth daily.    Marland Kitchen. aspirin EC 81 MG tablet Take 1 tablet (81 mg total) by mouth daily. (Patient not taking: Reported on 12/06/2018) 90 tablet 3  . omega-3 acid ethyl esters (LOVAZA) 1 g capsule Take 2 g by mouth 2 (two) times daily.    Marland Kitchen. CINNAMON PO Take 2 tablets by mouth daily.     No facility-administered medications prior to visit.     ROS Review of Systems  Constitutional: Negative for chills and fever.  HENT: Negative.   Eyes: Negative.   Respiratory: Negative.  Negative for cough.   Cardiovascular: Negative for chest pain.  Gastrointestinal: Negative for abdominal pain and diarrhea.  Endocrine: Negative.   Genitourinary:  Negative.   Musculoskeletal: Positive for arthralgias. Negative for myalgias and neck pain.  Skin: Negative.   Neurological: Negative.   Hematological: Negative for adenopathy. Does not bruise/bleed easily.  Psychiatric/Behavioral: Negative.     Objective:  BP 112/80 (BP Location: Left Arm, Patient Position: Sitting, Cuff Size: Normal)   Pulse 71   Temp 98 F (36.7 C) (Oral)   Ht 5\' 6"  (1.676 m)   Wt 185 lb (83.9 kg)   SpO2 96%   BMI 29.86 kg/m   BP Readings from Last 3 Encounters:  12/06/18 112/80  09/27/18 118/78  02/22/18 123/78    Wt Readings from Last 3 Encounters:  12/06/18 185 lb (83.9 kg)  09/27/18 184 lb 8 oz (83.7 kg)  02/22/18 191 lb (86.6 kg)    Physical Exam HENT:     Mouth/Throat:     Mouth: Mucous membranes are moist.     Pharynx: No oropharyngeal exudate.  Eyes:     Conjunctiva/sclera: Conjunctivae normal.  Neck:     Musculoskeletal: No neck rigidity.  Cardiovascular:     Rate and Rhythm: Normal rate and regular rhythm.     Heart sounds: No murmur.  Pulmonary:     Effort: Pulmonary effort is normal.     Breath sounds: No stridor. No wheezing, rhonchi or rales.  Abdominal:     General: Bowel sounds are normal.     Palpations: There is no mass.     Tenderness: There is no abdominal tenderness.  Musculoskeletal: Normal range of motion.        General: No swelling.     Left knee: He exhibits normal range of motion, no swelling, no effusion, no deformity, normal alignment, no LCL laxity, no bony tenderness, normal meniscus and no MCL laxity. Tenderness found. Medial joint line and MCL tenderness noted. No lateral joint line, no LCL and no patellar tendon tenderness noted.     Lab Results  Component Value Date   WBC 7.4 04/19/2016   HGB 15.4 04/19/2016   HCT 45 04/19/2016   PLT 236 04/19/2016   GLUCOSE 116 (H) 09/27/2018   CHOL 105 09/27/2018   TRIG 85.0 09/27/2018   HDL 39.50 09/27/2018   LDLDIRECT 84.0 01/12/2018   LDLCALC 49 09/27/2018    ALT 24 04/19/2016   AST 19 04/19/2016   NA 139 09/27/2018   K 3.9 09/27/2018   CL 102 09/27/2018   CREATININE 1.01 09/27/2018   BUN 19 09/27/2018   CO2 29 09/27/2018   TSH 0.57 04/19/2016   PSA 2.8 01/14/2018   INR 1.08 03/25/2010   HGBA1C 7.2 (H) 09/27/2018   MICROALBUR 1.0 01/12/2018    Dg Chest 2 View  Result Date: 08/17/2012 *RADIOLOGY REPORT* Clinical Data: Preoperative evaluation for Nissen fundoplication. Diabetes.  Nonsmoker.  No current chest complaints CHEST - 2 VIEW Comparison: 03/25/2010 Findings: Heart and mediastinal contours are within normal limits. The lung fields appear clear with no signs of focal infiltrate or congestive failure.  No pleural fluid or significant peribronchial cuffing is seen. Bony structures appear intact. IMPRESSION: Stable cardiopulmonary appearance with no new focal or acute abnormality identified. Original Report Authenticated By: Rhodia Albright, M.D.    Assessment & Plan:   Nathan Park was seen today for knee pain.  Diagnoses and all orders for this visit:  Left medial knee pain- I recommended that he control the pain with a combination of Tylenol, meloxicam, and tramadol.  I am concerned about the medial meniscus and medial collateral ligament so I have asked him to undergo an MRI without contrast. -     DG Knee Complete 4 Views Left; Future -     meloxicam (MOBIC) 15 MG tablet; Take 1 tablet (15 mg total) by mouth daily. -     MR Knee Right Wo Contrast; Future -     traMADol (ULTRAM) 50 MG tablet; Take 1 tablet (50 mg total) by mouth every 6 (six) hours as needed for up to 7 days for moderate pain.  Internal derangement of knee, acute, left -     MR Knee Right Wo Contrast; Future   I have discontinued Nathan Park's CINNAMON PO and multivitamin. I am also having him start on meloxicam and traMADol. Additionally, I am having him maintain his glucose blood, aspirin EC, OneTouch Delica Lancets 33G, omega-3 acid ethyl esters, sildenafil,  rosuvastatin, metFORMIN, and dapagliflozin propanediol.  Meds ordered this encounter  Medications  . meloxicam (MOBIC) 15 MG tablet    Sig: Take 1 tablet (15 mg total) by mouth daily.    Dispense:  90 tablet    Refill:  0  . traMADol (ULTRAM) 50 MG tablet    Sig: Take 1 tablet (50 mg total) by mouth every 6 (six) hours as needed for up to 7 days for moderate pain.    Dispense:  35 tablet    Refill:  0     Follow-up: Return if symptoms worsen or fail to improve.  Sanda Linger, MD

## 2018-12-07 DIAGNOSIS — M2392 Unspecified internal derangement of left knee: Secondary | ICD-10-CM | POA: Insufficient documentation

## 2018-12-07 DIAGNOSIS — M2391 Unspecified internal derangement of right knee: Secondary | ICD-10-CM | POA: Insufficient documentation

## 2018-12-07 MED ORDER — TRAMADOL HCL 50 MG PO TABS: 50.0000 mg | ORAL_TABLET | Freq: Four times a day (QID) | ORAL | 0 refills | Status: AC | PRN

## 2019-01-07 DIAGNOSIS — G4733 Obstructive sleep apnea (adult) (pediatric): Secondary | ICD-10-CM | POA: Diagnosis not present

## 2019-01-13 ENCOUNTER — Other Ambulatory Visit: Payer: Self-pay | Admitting: Internal Medicine

## 2019-01-13 DIAGNOSIS — E118 Type 2 diabetes mellitus with unspecified complications: Secondary | ICD-10-CM

## 2019-01-13 DIAGNOSIS — E785 Hyperlipidemia, unspecified: Secondary | ICD-10-CM

## 2019-01-13 DIAGNOSIS — Z794 Long term (current) use of insulin: Secondary | ICD-10-CM

## 2019-01-19 ENCOUNTER — Other Ambulatory Visit: Payer: Self-pay

## 2019-01-19 ENCOUNTER — Other Ambulatory Visit: Payer: Self-pay | Admitting: Internal Medicine

## 2019-01-19 ENCOUNTER — Encounter: Payer: Self-pay | Admitting: Internal Medicine

## 2019-01-19 ENCOUNTER — Ambulatory Visit
Admission: RE | Admit: 2019-01-19 | Discharge: 2019-01-19 | Disposition: A | Discharge status: Home / Self Care | Payer: Federal, State, Local not specified - PPO | Source: Ambulatory Visit | Attending: Internal Medicine | Admitting: Internal Medicine

## 2019-01-19 DIAGNOSIS — S83242A Other tear of medial meniscus, current injury, left knee, initial encounter: Secondary | ICD-10-CM | POA: Insufficient documentation

## 2019-01-19 DIAGNOSIS — M23222 Derangement of posterior horn of medial meniscus due to old tear or injury, left knee: Secondary | ICD-10-CM | POA: Diagnosis not present

## 2019-01-19 DIAGNOSIS — S83242D Other tear of medial meniscus, current injury, left knee, subsequent encounter: Secondary | ICD-10-CM

## 2019-01-19 DIAGNOSIS — M25562 Pain in left knee: Secondary | ICD-10-CM

## 2019-01-19 DIAGNOSIS — M2392 Unspecified internal derangement of left knee: Secondary | ICD-10-CM

## 2019-02-02 ENCOUNTER — Other Ambulatory Visit: Payer: Self-pay

## 2019-02-02 ENCOUNTER — Encounter: Payer: Self-pay | Admitting: Orthopaedic Surgery

## 2019-02-02 ENCOUNTER — Ambulatory Visit (INDEPENDENT_AMBULATORY_CARE_PROVIDER_SITE_OTHER): Payer: Federal, State, Local not specified - PPO | Admitting: Orthopaedic Surgery

## 2019-02-02 VITALS — Ht 66.0 in | Wt 180.0 lb

## 2019-02-02 DIAGNOSIS — S83242D Other tear of medial meniscus, current injury, left knee, subsequent encounter: Secondary | ICD-10-CM

## 2019-02-02 NOTE — Progress Notes (Signed)
Office Visit Note   Patient: Nathan Park           Date of Birth: 08-03-1966           MRN: [id number] Visit Date: 02/02/2019              Requested by: Janith Lima, MD [address] [place],  [place] [address] PCP: Janith Lima, MD   Assessment & Plan: Visit Diagnoses:  1. Acute medial meniscus tear of left knee, subsequent encounter     Plan: Impression is asymptomatic left medial meniscus tear.  I reviewed the MRI images and the tear appears to be minor.  He is not having any mechanical symptoms or effusion.  He is not really limited in any way in terms of activities.  I know him and think that he would benefit from cortisone injection.  I think the best thing to do is to do this and see how he does with resuming his normal activities.  He really does not seem to be bothered all that much by it therefore I do not think we need to intervene in any way today.  We will see the patient back as needed.  Follow-Up Instructions: Return if symptoms worsen or fail to improve.   Orders:  No orders of the defined types were placed in this encounter.  No orders of the defined types were placed in this encounter.     Procedures: No procedures performed   Clinical Data: No additional findings.   Subjective: Chief Complaint  Patient presents with   Left Knee - Pain    Nathan Park is a very pleasant 53 year old gentleman who comes in for evaluation of left knee pain since April.  He states that he is not exactly sure what happened but it may have happened when he was cutting down trees 1 day and the following day he states that he had a lot of pain and could barely walk.  He got better for a week then he started having pain again.  The pain is worse at the end of the day.  He normally runs for exercise but he has now decided to ride a stationary bike for exercise.  He denies any mechanical symptoms or swelling.  He states that he only has aching discomfort that is  worse at the end of the day and a very occasional quick sudden sharp pain on the medial aspect of the knee.  He is not really limited in any way.  He had an MRI done recently which showed a small tear of the medial meniscus.  He has been referred here by his PCP for evaluation for this.   Review of Systems  Constitutional: Negative.   All other systems reviewed and are negative.    Objective: Vital Signs: Ht 5\' 6"  (1.676 m)    Wt 180 lb (81.6 kg)    BMI 29.05 kg/m   Physical Exam Vitals signs and nursing note reviewed.  Constitutional:      Appearance: He is well-developed.  HENT:     Head: Normocephalic and atraumatic.  Eyes:     Pupils: Pupils are equal, round, and reactive to light.  Neck:     Musculoskeletal: Neck supple.  Pulmonary:     Effort: Pulmonary effort is normal.  Abdominal:     Palpations: Abdomen is soft.  Musculoskeletal: Normal range of motion.  Skin:    General: Skin is warm.  Neurological:  Mental Status: He is alert and oriented to person, place, and time.  Psychiatric:        Behavior: Behavior normal.        Thought Content: Thought content normal.        Judgment: Judgment normal.     Ortho Exam Left knee exam shows no joint effusion.  Normal range of motion.  No significant medial joint line tenderness.  Negative McMurray.  Collaterals and cruciates are stable. Specialty Comments:  No specialty comments available.  Imaging: No results found.   PMFS History: Patient Active Problem List   Diagnosis Date Noted   Acute medial meniscus tear of left knee 01/19/2019   Internal derangement of knee joint, right 12/07/2018   Internal derangement of knee, acute, left 12/07/2018   Left medial knee pain 12/06/2018   Immunity to measles determined by serologic test 01/12/2018   Episodic cluster headache, not intractable 08/24/2017   Routine general medical examination at a health care facility 04/17/2016   Hernia of abdominal cavity  03/25/2016   Chronic pansinusitis 01/08/2015   Erectile dysfunction 10/26/2013   Migraine headache without aura 05/04/2013   Refusal of blood transfusions as patient is Jehovah's Witness 03/12/2012   Barrett's esophagus 12/03/2011   Allergic rhinitis 10/16/2011   Hyperlipidemia LDL goal <70 10/16/2011   Type II diabetes mellitus with manifestations (HCC) 05/22/2011   Past Medical History:  Diagnosis Date   Barrett esophagus 01/31/10   at 32 cm   Complication of anesthesia    slow to wake up   Diabetes mellitus    Diverticulosis    GERD (gastroesophageal reflux disease)    Headache(784.0)    Hemorrhoids    Hiatal hernia    Hiatal hernia    Nasal congestion    Rectal pain    Refusal of blood transfusions as patient is Jehovah's Witness    Umbilical hernia     Family History  Problem Relation Age of Onset   Diabetes Father    Colon cancer Neg Hx    Cancer Neg Hx    Heart disease Neg Hx    Hypertension Neg Hx    Kidney disease Neg Hx     Past Surgical History:  Procedure Laterality Date   CARPAL TUNNEL RELEASE     LAPAROSCOPIC NISSEN FUNDOPLICATION  08/25/2012   Procedure: LAPAROSCOPIC NISSEN FUNDOPLICATION;  Surgeon: Valarie MerinoMatthew B Martin, MD;  Location: [facility];  Service: General;  Laterality: N/A;   ORIF DISTAL RADIUS FRACTURE  2009   repair umblical hernia repair  04/01/10   sigmoid colectomy  04/01/10   take down of colovesical fistula  04/01/10   takedown of enterovesical fistula  04/01/10   WRIST SURGERY     left   Social History   Occupational History    Employer: [employer]  Tobacco Use   Smoking status: Never Smoker   Smokeless tobacco: Never Used  Substance and Sexual Activity   Alcohol use: Yes    Alcohol/week: 5.0 standard drinks    Types: 3 Glasses of wine, 2 Shots of liquor per week    Comment: 2 drinks a week    Drug use: No   Sexual activity: Yes

## 2019-02-27 ENCOUNTER — Other Ambulatory Visit: Payer: Self-pay | Admitting: Internal Medicine

## 2019-02-27 DIAGNOSIS — M25562 Pain in left knee: Secondary | ICD-10-CM

## 2019-02-28 ENCOUNTER — Ambulatory Visit: Payer: Federal, State, Local not specified - PPO | Admitting: Adult Health

## 2019-02-28 ENCOUNTER — Encounter: Payer: Self-pay | Admitting: Adult Health

## 2019-02-28 ENCOUNTER — Other Ambulatory Visit: Payer: Self-pay

## 2019-02-28 VITALS — BP 114/74 | HR 66 | Temp 97.8°F | Ht 66.0 in | Wt 187.6 lb

## 2019-02-28 DIAGNOSIS — G4733 Obstructive sleep apnea (adult) (pediatric): Secondary | ICD-10-CM | POA: Diagnosis not present

## 2019-02-28 DIAGNOSIS — Z9989 Dependence on other enabling machines and devices: Secondary | ICD-10-CM

## 2019-02-28 NOTE — Progress Notes (Signed)
PATIENT: Nathan Park DOB: 1966-07-28  REASON FOR VISIT: follow up HISTORY FROM: patient  HISTORY OF PRESENT ILLNESS: Today 02/28/19:  Nathan Park is a 53 year old male with a history of obstructive sleep apnea on CPAP.  He returns today for follow-up.  He reports that he is not been using the CPAP machine.  States that he had lost weight and stop snoring so he stopped using it.  He states that since COVID-19 he has gained weight and began snoring again.  He states he also was waiting on new supplies and stopped using it because his mask was leaking.  He states that he now notices the benefit of the CPAP since he stopped using it.  He has got new supplies and plans to restart the CPAP.  HISTORY 02/22/18:  Nathan Park is a 53 year old male with a history of obstructive sleep apnea on CPAP.  He returns today for follow-up.  His CPAP download shows that he uses machine 23 out of 30 days for compliance of 77%.  He uses machine greater than 4 hours each night.  On average he uses his machine 6 hours and 17 minutes.  His residual AHI is 3.2 on 5 to 12 cm of water.  His leak in the 95th percentile is 36.1 L/min.  He states that the leak was waking him up as it made a whistling noise.  However he found a CPAP gel from [place] and that has eliminated him from hearing the leak.  His Epworth sleepiness score 7.   He feels that the CPAP continues to work well for him.  He returns today for an evaluation.  REVIEW OF SYSTEMS: Out of a complete 14 system review of symptoms, the patient complains only of the following symptoms, and all other reviewed systems are negative.  See HPI Epworth sleepiness score 12, fatigue severity score 23  ALLERGIES: Allergies  Allergen Reactions  . Morphine And Related     "Does not work for me"  . Oxycodone Hcl Itching and Nausea Only    HOME MEDICATIONS: Outpatient Medications Prior to Visit  Medication Sig Dispense Refill  . dapagliflozin propanediol (FARXIGA) 10  MG TABS tablet Take 10 mg by mouth daily. 90 tablet 1  . glucose blood (ONE TOUCH ULTRA TEST) test strip Use as instructed 100 each 12  . metFORMIN (GLUCOPHAGE-XR) 750 MG 24 hr tablet TAKE 2 TABLETS BY MOUTH WITH BREAKFAST 180 tablet 1  . ONETOUCH DELICA LANCETS 03K MISC Test up to BID. DX: E11.9 200 each 4  . rosuvastatin (CRESTOR) 5 MG tablet TAKE 1 TABLET BY MOUTH EVERY DAY 90 tablet 1  . sildenafil (REVATIO) 20 MG tablet Take 4 tablets (80 mg total) by mouth daily as needed. 60 tablet 11  . aspirin EC 81 MG tablet Take 1 tablet (81 mg total) by mouth daily. (Patient not taking: Reported on 12/06/2018) 90 tablet 3  . meloxicam (MOBIC) 15 MG tablet TAKE 1 TABLET BY MOUTH EVERY DAY 90 tablet 0  . omega-3 acid ethyl esters (LOVAZA) 1 g capsule Take 2 g by mouth 2 (two) times daily.     No facility-administered medications prior to visit.     PAST MEDICAL HISTORY: Past Medical History:  Diagnosis Date  . Barrett esophagus 01/31/10   at 32 cm  . Complication of anesthesia    slow to wake up  . Diabetes mellitus   . Diverticulosis   . GERD (gastroesophageal reflux disease)   . Headache(784.0)   .  Hemorrhoids   . Hiatal hernia   . Hiatal hernia   . Nasal congestion   . Rectal pain   . Refusal of blood transfusions as patient is Jehovah's Witness   . Umbilical hernia     PAST SURGICAL HISTORY: Past Surgical History:  Procedure Laterality Date  . CARPAL TUNNEL RELEASE    . LAPAROSCOPIC NISSEN FUNDOPLICATION  08/25/2012   Procedure: LAPAROSCOPIC NISSEN FUNDOPLICATION;  Surgeon: Valarie MerinoMatthew B Martin, MD;  Location: [facility];  Service: General;  Laterality: N/A;  . ORIF DISTAL RADIUS FRACTURE  2009  . repair umblical hernia repair  04/01/10  . sigmoid colectomy  04/01/10  . take down of colovesical fistula  04/01/10  . takedown of enterovesical fistula  04/01/10  . WRIST SURGERY     left    FAMILY HISTORY: Family History  Problem Relation Age of Onset  . Diabetes Father   . Colon cancer  Neg Hx   . Cancer Neg Hx   . Heart disease Neg Hx   . Hypertension Neg Hx   . Kidney disease Neg Hx     SOCIAL HISTORY: Social History   Socioeconomic History  . Marital status: Married    Spouse name: Not on file  . Number of children: 4  . Years of education: Not on file  . Highest education level: Not on file  Occupational History    Employer: [employer]  Social Needs  . Financial resource strain: Not on file  . Food insecurity    Worry: Not on file    Inability: Not on file  . Transportation needs    Medical: Not on file    Non-medical: Not on file  Tobacco Use  . Smoking status: Never Smoker  . Smokeless tobacco: Never Used  Substance and Sexual Activity  . Alcohol use: Yes    Alcohol/week: 5.0 standard drinks    Types: 3 Glasses of wine, 2 Shots of liquor per week    Comment: 2 drinks a week   . Drug use: No  . Sexual activity: Yes  Lifestyle  . Physical activity    Days per week: Not on file    Minutes per session: Not on file  . Stress: Not on file  Relationships  . Social Musicianconnections    Talks on phone: Not on file    Gets together: Not on file    Attends religious service: Not on file    Active member of club or organization: Not on file    Attends meetings of clubs or organizations: Not on file    Relationship status: Not on file  . Intimate partner violence    Fear of current or ex partner: Not on file    Emotionally abused: Not on file    Physically abused: Not on file    Forced sexual activity: Not on file  Other Topics Concern  . Not on file  Social History Narrative   No caffeine drinks       PHYSICAL EXAM  Vitals:   02/28/19 0751  BP: 114/74  Pulse: 66  Temp: 97.8 F (36.6 C)  Weight: 187 lb 9.6 oz (85.1 kg)  Height: 5\' 6"  (1.676 m)   Body mass index is 30.28 kg/m.  Generalized: Well developed, in no acute distress  Chest: Lungs clear to auscultation bilaterally  Neurological examination  Mentation: Alert oriented to  time, place, history taking. Follows all commands speech and language fluent Cranial nerve II-XII: . Extraocular movements were  full. Facial sensation and strength were normal.  Motor: The motor testing reveals 5 over 5 strength of all 4 extremities. Good symmetric motor tone is noted throughout.  Sensory: Sensory testing is intact to soft touch on all 4 extremities. No evidence of extinction is noted.  Gait and station: Gait is normal. .    DIAGNOSTIC DATA (LABS, IMAGING, TESTING) - I reviewed patient records, labs, notes, testing and imaging myself where available.  Lab Results  Component Value Date   WBC 7.4 04/19/2016   HGB 15.4 04/19/2016   HCT 45 04/19/2016   MCV 86.5 01/08/2015   PLT 236 04/19/2016      Component Value Date/Time   NA 139 09/27/2018 1636   NA 141 04/19/2016   K 3.9 09/27/2018 1636   CL 102 09/27/2018 1636   CO2 29 09/27/2018 1636   GLUCOSE 116 (H) 09/27/2018 1636   BUN 19 09/27/2018 1636   BUN 16 04/19/2016   CREATININE 1.01 09/27/2018 1636   CALCIUM 9.2 09/27/2018 1636   PROT 6.9 01/08/2015 1141   ALBUMIN 4.5 01/08/2015 1141   AST 19 04/19/2016   ALT 24 04/19/2016   ALKPHOS 41 01/08/2015 1141   BILITOT 0.5 01/08/2015 1141   GFRNONAA >90 08/27/2012 0407   GFRAA >90 08/27/2012 0407   Lab Results  Component Value Date   CHOL 105 09/27/2018   HDL 39.50 09/27/2018   LDLCALC 49 09/27/2018   LDLDIRECT 84.0 01/12/2018   TRIG 85.0 09/27/2018   CHOLHDL 3 09/27/2018   Lab Results  Component Value Date   HGBA1C 7.2 (H) 09/27/2018   No results found for: VITAMINB12 Lab Results  Component Value Date   TSH 0.57 04/19/2016      ASSESSMENT AND PLAN 53 y.o. year old male  has a past medical history of Barrett esophagus (01/31/10), Complication of anesthesia, Diabetes mellitus, Diverticulosis, GERD (gastroesophageal reflux disease), Headache(784.0), Hemorrhoids, Hiatal hernia, Hiatal hernia, Nasal congestion, Rectal pain, Refusal of blood transfusions  as patient is Jehovah's Witness, and Umbilical hernia. here with:  1.  Obstructive sleep apnea on CPAP  The patient has not been using his CPAP machine.  He is advised to restart CPAP therapy.  If his symptoms worsen or he develops new symptoms he should let us know.  He will follow-up in 6 months or sooner if needed.   I spent 15 minutes with the patient. 50% of this time was spent discussing the risk associated with untreated sleep apnea   Butch PennyMegan Elhadj Girton, MSN, NP-C 02/28/2019, 8:05 AM [facility] [address], [address] [place], [place] [address] [phone]

## 2019-02-28 NOTE — Patient Instructions (Signed)
Your Plan:  Restart CPAP therapy If your symptoms worsen or you develop new symptoms please let us know.   Thank you for coming to see us at [facility]. I hope we have been able to provide you high quality care today.  You may receive a patient satisfaction survey over the next few weeks. We would appreciate your feedback and comments so that we may continue to improve ourselves and the health of our patients.  

## 2019-03-10 ENCOUNTER — Encounter: Payer: Self-pay | Admitting: Internal Medicine

## 2019-03-10 ENCOUNTER — Other Ambulatory Visit: Payer: Self-pay

## 2019-03-10 ENCOUNTER — Ambulatory Visit (INDEPENDENT_AMBULATORY_CARE_PROVIDER_SITE_OTHER): Payer: Federal, State, Local not specified - PPO | Admitting: Internal Medicine

## 2019-03-10 ENCOUNTER — Other Ambulatory Visit (INDEPENDENT_AMBULATORY_CARE_PROVIDER_SITE_OTHER): Payer: Federal, State, Local not specified - PPO

## 2019-03-10 ENCOUNTER — Ambulatory Visit (INDEPENDENT_AMBULATORY_CARE_PROVIDER_SITE_OTHER)
Admission: RE | Admit: 2019-03-10 | Discharge: 2019-03-10 | Disposition: A | Discharge status: Home / Self Care | Payer: Federal, State, Local not specified - PPO | Source: Ambulatory Visit | Attending: Internal Medicine | Admitting: Internal Medicine

## 2019-03-10 VITALS — BP 118/78 | HR 66 | Temp 98.1°F | Ht 66.0 in | Wt 187.1 lb

## 2019-03-10 DIAGNOSIS — E781 Pure hyperglyceridemia: Secondary | ICD-10-CM | POA: Diagnosis not present

## 2019-03-10 DIAGNOSIS — Z125 Encounter for screening for malignant neoplasm of prostate: Secondary | ICD-10-CM

## 2019-03-10 DIAGNOSIS — E785 Hyperlipidemia, unspecified: Secondary | ICD-10-CM

## 2019-03-10 DIAGNOSIS — Z0000 Encounter for general adult medical examination without abnormal findings: Secondary | ICD-10-CM | POA: Diagnosis not present

## 2019-03-10 DIAGNOSIS — R6884 Jaw pain: Secondary | ICD-10-CM

## 2019-03-10 DIAGNOSIS — E118 Type 2 diabetes mellitus with unspecified complications: Secondary | ICD-10-CM

## 2019-03-10 DIAGNOSIS — Z0001 Encounter for general adult medical examination with abnormal findings: Secondary | ICD-10-CM | POA: Diagnosis not present

## 2019-03-10 LAB — URINALYSIS, ROUTINE W REFLEX MICROSCOPIC
Bilirubin Urine: NEGATIVE
Leukocytes,Ua: NEGATIVE
Nitrite: NEGATIVE
Specific Gravity, Urine: 1.02 (ref 1.000–1.030)
Total Protein, Urine: NEGATIVE
Urine Glucose: >=1000 — AB
Urobilinogen, UA: 0.2 (ref 0.0–1.0)
WBC, UA: NONE SEEN (ref 0–?)
pH: 6 (ref 5.0–8.0)

## 2019-03-10 LAB — LDL CHOLESTEROL, DIRECT: Direct LDL: 54 mg/dL

## 2019-03-10 LAB — LIPID PANEL
Cholesterol: 130 mg/dL (ref 0–200)
HDL: 42.6 mg/dL (ref >39.00)
NonHDL: 87.7
Total CHOL/HDL Ratio: 3
Triglycerides: 362 mg/dL — ABNORMAL HIGH (ref 0.0–149.0)
VLDL: 72.4 mg/dL — ABNORMAL HIGH (ref 0.0–40.0)

## 2019-03-10 LAB — BASIC METABOLIC PANEL
BUN: 21 mg/dL (ref 6–23)
CO2: 30 mEq/L (ref 19–32)
Calcium: 10.4 mg/dL (ref 8.4–10.5)
Chloride: 103 mEq/L (ref 96–112)
Creatinine, Ser: 0.98 mg/dL (ref 0.40–1.50)
GFR: 79.38 mL/min (ref >60.00)
Glucose, Bld: 133 mg/dL — ABNORMAL HIGH (ref 70–99)
Potassium: 4.7 mEq/L (ref 3.5–5.1)
Sodium: 142 mEq/L (ref 135–145)

## 2019-03-10 LAB — CBC WITH DIFFERENTIAL/PLATELET
Basophils Absolute: 0.1 10*3/uL (ref 0.0–0.1)
Basophils Relative: 0.7 % (ref 0.0–3.0)
Eosinophils Absolute: 0.3 10*3/uL (ref 0.0–0.7)
Eosinophils Relative: 4.5 % (ref 0.0–5.0)
HCT: 49.6 % (ref 39.0–52.0)
Hemoglobin: 16.8 g/dL (ref 13.0–17.0)
Lymphocytes Relative: 24 % (ref 12.0–46.0)
Lymphs Abs: 1.9 10*3/uL (ref 0.7–4.0)
MCHC: 33.9 g/dL (ref 30.0–36.0)
MCV: 90.1 fl (ref 78.0–100.0)
Monocytes Absolute: 0.8 10*3/uL (ref 0.1–1.0)
Monocytes Relative: 10.6 % (ref 3.0–12.0)
Neutro Abs: 4.6 10*3/uL (ref 1.4–7.7)
Neutrophils Relative %: 60.2 % (ref 43.0–77.0)
Platelets: 201 10*3/uL (ref 150.0–400.0)
RBC: 5.51 Mil/uL (ref 4.22–5.81)
RDW: 13.2 % (ref 11.5–15.5)
WBC: 7.7 10*3/uL (ref 4.0–10.5)

## 2019-03-10 LAB — HEPATIC FUNCTION PANEL
ALT: 36 U/L (ref 0–53)
AST: 24 U/L (ref 0–37)
Albumin: 5 g/dL (ref 3.5–5.2)
Alkaline Phosphatase: 60 U/L (ref 39–117)
Bilirubin, Direct: 0.1 mg/dL (ref 0.0–0.3)
Total Bilirubin: 0.5 mg/dL (ref 0.2–1.2)
Total Protein: 7.1 g/dL (ref 6.0–8.3)

## 2019-03-10 LAB — MICROALBUMIN / CREATININE URINE RATIO
Creatinine,U: 63 mg/dL
Microalb Creat Ratio: 2.4 mg/g (ref 0.0–30.0)
Microalb, Ur: 1.5 mg/dL (ref 0.0–1.9)

## 2019-03-10 LAB — SEDIMENTATION RATE: Sed Rate: 2 mm/hr (ref 0–20)

## 2019-03-10 LAB — TSH: TSH: 0.74 u[IU]/mL (ref 0.35–4.50)

## 2019-03-10 LAB — PSA: PSA: 3.57 ng/mL (ref 0.10–4.00)

## 2019-03-10 LAB — HEMOGLOBIN A1C: Hgb A1c MFr Bld: 7.3 % — ABNORMAL HIGH (ref 4.6–6.5)

## 2019-03-10 MED ORDER — OMEGA-3-ACID ETHYL ESTERS 1 G PO CAPS: 2.0000 g | ORAL_CAPSULE | Freq: Two times a day (BID) | ORAL | 1 refills | Status: DC

## 2019-03-10 NOTE — Patient Instructions (Signed)
Parotitis  Parotitis is inflammation of one or both of your parotid glands. These glands produce saliva. They are found on each side of your face, below and in front of your earlobes. The saliva that they produce comes out of tiny openings (ducts) inside your cheeks. Parotitis may cause sudden swelling and pain (acute parotitis). It can also cause repeated episodes of swelling and pain or continued swelling that may or may not be painful (chronic parotitis). What are the causes? This condition may be caused by:  Infections from bacteria.  Infections from viruses, such as mumps or HIV.  Blockage (obstruction) of saliva flow through the parotid glands. This can be from a stone, scar tissue, or a tumor.  Diseases that cause your body's defense system (immune system) to attack healthy cells in your salivary glands. These are called autoimmune diseases. What increases the risk? You are more likely to develop this condition if:  You are 50 years old or older.  You do not drink enough fluids (are dehydrated).  You drink too much alcohol.  You have: ? A dry mouth. ? Poor dental hygiene. ? Diabetes. ? Gout. ? A long-term illness.  You have had radiation treatments to the head and neck.  You take certain medicines. What are the signs or symptoms? Symptoms of this condition depend on the cause. Symptoms may include:  Swelling under and in front of the ear. This may get worse after eating.  Redness of the skin over the parotid gland.  Pain and tenderness over the parotid gland. This may get worse after eating.  Fever or chills.  Pus coming from the ducts inside the mouth.  Dry mouth.  A bad taste in the mouth. How is this diagnosed? This condition may be diagnosed based on:  Your medical history.  A physical exam.  Tests to find the cause of the parotitis. These may include: ? Doing blood tests to check for an autoimmune disease or infections from a virus. ? Taking a  fluid sample from the parotid gland and testing it for infection. ? Injecting the ducts of the parotid gland with a dye and then taking X-rays (sialogram). ? Having other imaging tests of the gland, such as X-rays, ultrasound, MRI, or CT scan. ? Checking the opening of the gland for a stone or obstruction. ? Placing a needle into the gland to remove tissue for a biopsy (fine needle aspiration). How is this treated? Treatment for this condition depends on the cause. Treatment may include:  Antibiotic medicine for a bacterial infection.  Drinking more fluids.  Removing a stone or obstruction.  Treating an underlying disease that is causing parotitis.  Surgery to drain an infection, remove a growth, or remove the whole gland (parotidectomy). Treatment may not be needed if parotid swelling goes away with home care. Follow these instructions at home: Medicines   Take over-the-counter and prescription medicines only as told by your health care provider.  If you were prescribed an antibiotic medicine, take it as told by your health care provider. Do not stop taking the antibiotic even if you start to feel better. Managing pain and swelling  If directed, apply heat to the affected area as often as told by your health care provider. Use the heat source that your health care provider recommends, such as a moist heat pack or a heating pad. To apply the heat: ? Place a towel between your skin and the heat source. ? Leave the heat on for 20-30 minutes. ?   Remove the heat if your skin turns bright red. This is especially important if you are unable to feel pain, heat, or cold. You may have a greater risk of getting burned.  Gargle with a salt-water mixture 3-4 times a day or as needed. To make a salt-water mixture, completely dissolve -1 tsp (3-6 g) of salt in 1 cup (237 mL) of warm water.  Gently massage the parotid glands as told by your health care provider. General instructions   Drink  enough fluid to keep your urine pale yellow.  Keep your mouth clean and moist.  Try sucking on sour candy. This may help to make your mouth less dry by stimulating the flow of saliva.  Maintain good oral health. ? Brush your teeth at least two times a day. ? Floss your teeth every day. ? See your dentist regularly.  Do not use any products that contain nicotine or tobacco, such as cigarettes, e-cigarettes, and chewing tobacco. If you need help quitting, ask your health care provider.  Do not drink alcohol.  Keep all follow-up visits as told by your health care provider. This is important. Contact a health care provider if:  You have a fever or chills.  You have new symptoms.  Your symptoms get worse.  Your symptoms do not improve with treatment. Get help right away if:  You have difficulty breathing or swallowing because of the swollen gland. Summary  Parotitis is inflammation of one or both of your parotid glands.  Symptoms include pain and swelling under and in front of the ear. They may also include a fever and a bad taste in your mouth.  This condition may be treated with antibiotics, increasing fluids, or surgery.  In some cases, parotitis may go away on its own without treatment.  You should drink plenty of fluids, maintain good oral hygiene, and avoid tobacco products. This information is not intended to replace advice given to you by your health care provider. Make sure you discuss any questions you have with your health care provider. Document Released: 01/24/2002 Document Revised: 03/02/2018 Document Reviewed: 03/02/2018 [employer] Patient Education  2020 [employer]  

## 2019-03-10 NOTE — Progress Notes (Signed)
Subjective:  Patient ID: Nathan Park, male    DOB: 20-May-1966  Age: 53 y.o. MRN: [id number]  CC: Jaw Pain (Right sided jaw pain), Annual Exam, Hyperlipidemia, and Diabetes   HPI Nathan Park presents for a CPX.  He complains of a 5-day history of right jaw pain.  He points to the angle of the mandible.  He has not noticed any swelling or skin lesions.  He said it hurts most when he eats and chews.  His symptoms have gradually resolved after a few doses of Tylenol.  He otherwise feels well and offers no other complaints.  Outpatient Medications Prior to Visit  Medication Sig Dispense Refill   dapagliflozin propanediol (FARXIGA) 10 MG TABS tablet Take 10 mg by mouth daily. 90 tablet 1   glucose blood (ONE TOUCH ULTRA TEST) test strip Use as instructed 100 each 12   meloxicam (MOBIC) 15 MG tablet Take 15 mg by mouth daily.     metFORMIN (GLUCOPHAGE-XR) 750 MG 24 hr tablet TAKE 2 TABLETS BY MOUTH WITH BREAKFAST 180 tablet 1   ONETOUCH DELICA LANCETS 33G MISC Test up to BID. DX: E11.9 200 each 4   rosuvastatin (CRESTOR) 5 MG tablet TAKE 1 TABLET BY MOUTH EVERY DAY 90 tablet 1   sildenafil (REVATIO) 20 MG tablet Take 4 tablets (80 mg total) by mouth daily as needed. 60 tablet 11   No facility-administered medications prior to visit.     ROS Review of Systems  Constitutional: Positive for unexpected weight change (wt gain). Negative for chills, fatigue and fever.  HENT: Negative.  Negative for drooling, ear discharge, ear pain, facial swelling, mouth sores, sinus pressure, sore throat and trouble swallowing.   Eyes: Negative.  Negative for visual disturbance.  Respiratory: Negative.  Negative for cough, chest tightness, shortness of breath and wheezing.   Cardiovascular: Negative for chest pain, palpitations and leg swelling.  Gastrointestinal: Negative for abdominal pain, constipation, diarrhea, nausea and vomiting.  Endocrine: Negative.   Genitourinary: Negative.  Negative for  difficulty urinating, discharge, penile pain, penile swelling, scrotal swelling and testicular pain.  Musculoskeletal: Negative.  Negative for arthralgias and myalgias.  Skin: Negative for color change, pallor, rash and wound.  Neurological: Negative.  Negative for dizziness, weakness, light-headedness and headaches.  Hematological: Negative for adenopathy. Does not bruise/bleed easily.  Psychiatric/Behavioral: Negative.     Objective:  BP 118/78 (BP Location: Left Arm, Patient Position: Sitting, Cuff Size: Normal)    Pulse 66    Temp 98.1 F (36.7 C) (Oral)    Ht 5\' 6"  (1.676 m)    Wt 187 lb 1.9 oz (84.9 kg)    SpO2 97%    BMI 30.20 kg/m   BP Readings from Last 3 Encounters:  03/10/19 118/78  02/28/19 114/74  12/06/18 112/80    Wt Readings from Last 3 Encounters:  03/10/19 187 lb 1.9 oz (84.9 kg)  02/28/19 187 lb 9.6 oz (85.1 kg)  02/02/19 180 lb (81.6 kg)    Physical Exam Vitals signs reviewed.  Constitutional:      General: He is not in acute distress.    Appearance: He is obese. He is not ill-appearing, toxic-appearing or diaphoretic.  HENT:     Head: Normocephalic.     Jaw: No trismus, tenderness, swelling, pain on movement or malocclusion.     Salivary Glands: Right salivary gland is not diffusely enlarged or tender. Left salivary gland is not diffusely enlarged or tender.      Nose: Nose  normal.     Mouth/Throat:     Lips: Pink.     Mouth: Mucous membranes are moist. No oral lesions.     Dentition: Normal dentition. No gingival swelling or gum lesions.     Tongue: No lesions.     Pharynx: Oropharynx is clear. No pharyngeal swelling, oropharyngeal exudate or posterior oropharyngeal erythema.     Tonsils: No tonsillar exudate.  Eyes:     General: No scleral icterus.    Conjunctiva/sclera: Conjunctivae normal.  Neck:     Musculoskeletal: Normal range of motion. No edema, erythema, neck rigidity, crepitus, pain with movement or torticollis.     Thyroid: No thyroid  mass, thyromegaly or thyroid tenderness.     Vascular: Normal carotid pulses. No carotid bruit or JVD.     Trachea: Trachea normal.  Cardiovascular:     Rate and Rhythm: Normal rate and regular rhythm.     Pulses:          Carotid pulses are 1+ on the right side and 1+ on the left side.      Radial pulses are 1+ on the right side and 1+ on the left side.       Femoral pulses are 1+ on the right side and 1+ on the left side.      Popliteal pulses are 1+ on the right side and 1+ on the left side.       Dorsalis pedis pulses are 1+ on the right side and 1+ on the left side.       Posterior tibial pulses are 1+ on the right side and 1+ on the left side.     Heart sounds: No murmur. No gallop.   Pulmonary:     Effort: Pulmonary effort is normal.     Breath sounds: No stridor. No wheezing, rhonchi or rales.  Abdominal:     General: Abdomen is flat. There is no distension.     Palpations: There is no hepatomegaly, splenomegaly or mass.     Tenderness: There is no abdominal tenderness. There is no guarding.  Genitourinary:    Pubic Area: No rash.      Penis: Normal. No discharge, swelling or lesions.      Scrotum/Testes: Normal.        Right: Mass, tenderness or swelling not present.        Left: Mass, tenderness or swelling not present.     Epididymis:     Right: Normal. Not inflamed or enlarged.     Left: Normal. Not inflamed or enlarged.     Prostate: Normal. Not enlarged, not tender and no nodules present.     Rectum: Normal. Guaiac result negative. No mass, tenderness, anal fissure, external hemorrhoid or internal hemorrhoid. Normal anal tone.  Lymphadenopathy:     Cervical: No cervical adenopathy.     Right cervical: No superficial, deep or posterior cervical adenopathy.    Left cervical: No superficial, deep or posterior cervical adenopathy.  Neurological:     Mental Status: He is alert.     Lab Results  Component Value Date   WBC 7.7 03/10/2019   HGB 16.8 03/10/2019   HCT  49.6 03/10/2019   PLT 201.0 03/10/2019   GLUCOSE 133 (H) 03/10/2019   CHOL 130 03/10/2019   TRIG 362.0 (H) 03/10/2019   HDL 42.60 03/10/2019   LDLDIRECT 54.0 03/10/2019   LDLCALC 49 09/27/2018   ALT 36 03/10/2019   AST 24 03/10/2019   NA 142 03/10/2019  K 4.7 03/10/2019   CL 103 03/10/2019   CREATININE 0.98 03/10/2019   BUN 21 03/10/2019   CO2 30 03/10/2019   TSH 0.74 03/10/2019   PSA 3.57 03/10/2019   INR 1.08 03/25/2010   HGBA1C 7.3 (H) 03/10/2019   MICROALBUR 1.5 03/10/2019    Mr Knee Left  Wo Contrast  Result Date: 01/19/2019 CLINICAL DATA:  Left posterior knee pain and weakness. EXAM: MRI OF THE LEFT KNEE WITHOUT CONTRAST TECHNIQUE: Multiplanar, multisequence MR imaging of the knee was performed. No intravenous contrast was administered. COMPARISON:  None. FINDINGS: MENISCI Medial meniscus: Oblique tear of the body of medial meniscus extending into the posterior horn and to the inferior articular surface. Lateral meniscus:  Intact. LIGAMENTS Cruciates:  Intact ACL and PCL. Collaterals: Medial collateral ligament is intact. Lateral collateral ligament complex is intact. CARTILAGE Patellofemoral: Mild partial-thickness cartilage loss of the trochlear groove. Medial: Mild partial-thickness cartilage loss of the medial femorotibial compartment. Lateral:  No chondral defect. Joint: No joint effusion. Mild edema in Hoffa's fat. No plical thickening. Popliteal Fossa: Tiny Baker's cyst. 4.6 x 0.9 cm cystic mass with a few loculations between the semimembranosus and medial head of the gastrocnemius muscles likely reflecting a ganglion cyst. Intact popliteus tendon. Extensor Mechanism: Intact quadriceps tendon. Intact patellar tendon. Intact medial patellar retinaculum. Intact lateral patellar retinaculum. Intact MPFL. Bones:  No acute osseous abnormality.  No aggressive osseous lesion. Other: No fluid collection or hematoma. IMPRESSION: 1. Oblique tear of the body of medial meniscus extending  into the posterior horn and to the inferior articular surface. 2. Mild partial-thickness cartilage loss of the trochlear groove. 3. Mild partial-thickness cartilage loss of the medial femorotibial compartment. Electronically Signed   By: Kathreen Devoid   On: 01/19/2019 11:03    Assessment & Plan:   Nathan Park was seen today for jaw pain, annual exam, hyperlipidemia and diabetes.  Diagnoses and all orders for this visit:  Type II diabetes mellitus with manifestations ([place])- His A1c is up to 7.3%.  His blood sugars are not quite adequately well controlled.  I have asked him to continue the current regimen and to improve his lifestyle modifications. -     CBC with Differential/Platelet; Future -     Basic metabolic panel; Future -     Hemoglobin A1c; Future -     Urinalysis, Routine w reflex microscopic; Future -     Microalbumin / creatinine urine ratio; Future -     C-peptide; Future -     HM Diabetes Foot Exam  Hyperlipidemia LDL goal <70- He has achieved his LDL goal and is doing well on the statin. -     Hepatic function panel; Future -     TSH; Future  Routine general medical examination at a health care facility- Exam completed, labs reviewed, vaccines reviewed, screening for colon cancer is up-to-date, patient education material was given. -     Lipid panel; Future -     PSA; Future  Pain in lower jaw-symptoms are improving.  Examination of the area is normal.  Plain films are normal.  Lab work does not raise any suspicion for systemic illness such as inflammation or infection.  I suspect this is musculoskeletal pain and have offered him reassurance.  I have asked him to take Motrin as needed in addition to the Tylenol. -     Sedimentation rate; Future -     DG Mandible 4 Views; Future  Pure hypertriglyceridemia- His triglycerides are too high.  In  addition to lifestyle modifications I have asked him to start taking an omega-3 acid ethyl ester fish oil supplement. -     omega-3 acid ethyl  esters (LOVAZA) 1 g capsule; Take 2 capsules (2 g total) by mouth 2 (two) times daily.   I am having Nathan Park start on omega-3 acid ethyl esters. I am also having him maintain his glucose blood, OneTouch Delica Lancets 33G, sildenafil, metFORMIN, dapagliflozin propanediol, rosuvastatin, and meloxicam.  Meds ordered this encounter  Medications   omega-3 acid ethyl esters (LOVAZA) 1 g capsule    Sig: Take 2 capsules (2 g total) by mouth 2 (two) times daily.    Dispense:  360 capsule    Refill:  1     Follow-up: Return in about 3 weeks (around 03/31/2019).  Sanda Lingerhomas Burlin Mcnair, MD

## 2019-03-11 LAB — C-PEPTIDE: C-Peptide: 3.86 ng/mL — ABNORMAL HIGH (ref 0.80–3.85)

## 2019-03-19 ENCOUNTER — Other Ambulatory Visit: Payer: Self-pay | Admitting: Internal Medicine

## 2019-03-19 DIAGNOSIS — E118 Type 2 diabetes mellitus with unspecified complications: Secondary | ICD-10-CM

## 2019-03-21 ENCOUNTER — Other Ambulatory Visit: Payer: Self-pay | Admitting: Internal Medicine

## 2019-03-21 DIAGNOSIS — E118 Type 2 diabetes mellitus with unspecified complications: Secondary | ICD-10-CM

## 2019-03-26 DIAGNOSIS — M9908 Segmental and somatic dysfunction of rib cage: Secondary | ICD-10-CM | POA: Diagnosis not present

## 2019-03-26 DIAGNOSIS — M5414 Radiculopathy, thoracic region: Secondary | ICD-10-CM | POA: Diagnosis not present

## 2019-03-26 DIAGNOSIS — M9902 Segmental and somatic dysfunction of thoracic region: Secondary | ICD-10-CM | POA: Diagnosis not present

## 2019-03-26 DIAGNOSIS — M9903 Segmental and somatic dysfunction of lumbar region: Secondary | ICD-10-CM | POA: Diagnosis not present

## 2019-04-14 ENCOUNTER — Ambulatory Visit (INDEPENDENT_AMBULATORY_CARE_PROVIDER_SITE_OTHER): Payer: Federal, State, Local not specified - PPO | Admitting: Internal Medicine

## 2019-04-14 ENCOUNTER — Encounter: Payer: Self-pay | Admitting: Internal Medicine

## 2019-04-14 ENCOUNTER — Other Ambulatory Visit: Payer: Self-pay

## 2019-04-14 VITALS — BP 122/82 | HR 79 | Temp 98.3°F | Resp 16 | Ht 66.0 in | Wt 188.0 lb

## 2019-04-14 DIAGNOSIS — G43011 Migraine without aura, intractable, with status migrainosus: Secondary | ICD-10-CM

## 2019-04-14 DIAGNOSIS — G44019 Episodic cluster headache, not intractable: Secondary | ICD-10-CM | POA: Diagnosis not present

## 2019-04-14 MED ORDER — UBRELVY 100 MG PO TABS: 1.0000 | ORAL_TABLET | ORAL | 5 refills | Status: DC | PRN

## 2019-04-14 MED ORDER — EMGALITY 120 MG/ML ~~LOC~~ SOAJ: 1.0000 | SUBCUTANEOUS | 1 refills | Status: DC

## 2019-04-14 NOTE — Progress Notes (Signed)
Subjective:  Patient ID: Nathan Park, male    DOB: 1965/12/06  Age: 53 y.o. MRN: [id number]  CC: Headache   HPI Nathan Park presents for concerns about a headache.  He has had a right-sided headache for about 4 days.  This is not the worst headache he has ever had. He has had a headache similar to this in the past, his only concern at this point is that the HA has lasted longer than any other headache he has had.  He describes it as a pressure and a stabbing sensation in his right temple and around his right eye.  He has not gotten symptom relief with Tylenol or Advil.  He has about 4-5 headaches per month.  He has had no nausea, vomiting, visual disturbance, neck pain, paresthesias, dizziness, lightheadedness, fever, or chills.  Outpatient Medications Prior to Visit  Medication Sig Dispense Refill  . FARXIGA 10 MG TABS tablet TAKE 1 TABLET EVERY DAY 90 tablet 1  . glucose blood (ONE TOUCH ULTRA TEST) test strip Use as instructed 100 each 12  . meloxicam (MOBIC) 15 MG tablet Take 15 mg by mouth daily.    . metFORMIN (GLUCOPHAGE-XR) 750 MG 24 hr tablet TAKE 2 TABLETS EVERY DAY WITH BREAKFAST 180 tablet 1  . omega-3 acid ethyl esters (LOVAZA) 1 g capsule Take 2 capsules (2 g total) by mouth 2 (two) times daily. 360 capsule 1  . ONETOUCH DELICA LANCETS 40J MISC Test up to BID. DX: E11.9 200 each 4  . rosuvastatin (CRESTOR) 5 MG tablet TAKE 1 TABLET BY MOUTH EVERY DAY 90 tablet 1  . sildenafil (REVATIO) 20 MG tablet Take 4 tablets (80 mg total) by mouth daily as needed. 60 tablet 11   No facility-administered medications prior to visit.     ROS Review of Systems  Constitutional: Negative.  Negative for chills, fatigue and fever.  HENT: Negative.  Negative for sore throat and trouble swallowing.   Eyes: Negative for photophobia, pain and visual disturbance.  Respiratory: Negative for cough, chest tightness, shortness of breath and wheezing.   Cardiovascular: Negative for chest pain,  palpitations and leg swelling.  Gastrointestinal: Negative for abdominal pain, diarrhea and nausea.  Endocrine: Negative.   Genitourinary: Negative.  Negative for difficulty urinating.  Musculoskeletal: Negative for arthralgias, back pain and neck pain.  Skin: Negative.  Negative for rash.  Neurological: Positive for headaches. Negative for dizziness, speech difficulty, weakness, light-headedness and numbness.  Hematological: Negative for adenopathy. Does not bruise/bleed easily.  Psychiatric/Behavioral: Negative.  Negative for sleep disturbance. The patient is not nervous/anxious.     Objective:  BP 122/82 (BP Location: Left Arm, Patient Position: Sitting, Cuff Size: Large)   Pulse 79   Temp 98.3 F (36.8 C) (Oral)   Resp 16   Ht 5\' 6"  (1.676 m)   Wt 188 lb (85.3 kg)   SpO2 97%   BMI 30.34 kg/m   BP Readings from Last 3 Encounters:  04/14/19 122/82  03/10/19 118/78  02/28/19 114/74    Wt Readings from Last 3 Encounters:  04/14/19 188 lb (85.3 kg)  03/10/19 187 lb 1.9 oz (84.9 kg)  02/28/19 187 lb 9.6 oz (85.1 kg)    Physical Exam Vitals signs reviewed.  Constitutional:      General: He is not in acute distress.    Appearance: He is well-developed. He is not ill-appearing, toxic-appearing or diaphoretic.  HENT:     Nose: Nose normal.     Mouth/Throat:  Mouth: Mucous membranes are moist.  Eyes:     General: No scleral icterus.    Conjunctiva/sclera: Conjunctivae normal.  Neck:     Musculoskeletal: Normal range of motion. No neck rigidity.     Meningeal: Brudzinski's sign and Kernig's sign absent.  Cardiovascular:     Rate and Rhythm: Normal rate and regular rhythm.     Heart sounds: No murmur.  Pulmonary:     Effort: Pulmonary effort is normal. No respiratory distress.     Breath sounds: No stridor. No wheezing, rhonchi or rales.  Abdominal:     General: Abdomen is flat. There is no distension.     Palpations: Abdomen is soft. There is no hepatomegaly,  splenomegaly or mass.     Tenderness: There is no abdominal tenderness. There is no guarding.  Musculoskeletal: Normal range of motion.     Right lower leg: No edema.     Left lower leg: No edema.  Lymphadenopathy:     Cervical: No cervical adenopathy.  Skin:    General: Skin is warm and dry.  Neurological:     General: No focal deficit present.     Mental Status: He is alert and oriented to person, place, and time. Mental status is at baseline.     Cranial Nerves: Cranial nerves are intact.     Sensory: Sensation is intact.     Motor: Motor function is intact.     Coordination: Coordination is intact.     Deep Tendon Reflexes: Reflexes normal. Babinski sign absent on the right side. Babinski sign absent on the left side.     Reflex Scores:      Tricep reflexes are 0 on the right side and 0 on the left side.      Bicep reflexes are 1+ on the right side and 1+ on the left side.      Brachioradialis reflexes are 0 on the right side and 0 on the left side.      Patellar reflexes are 1+ on the right side and 1+ on the left side.      Achilles reflexes are 0 on the right side and 0 on the left side. Psychiatric:        Mood and Affect: Mood normal. Mood is not anxious or depressed.        Behavior: Behavior normal.        Thought Content: Thought content normal.        Judgment: Judgment normal.     Lab Results  Component Value Date   WBC 7.7 03/10/2019   HGB 16.8 03/10/2019   HCT 49.6 03/10/2019   PLT 201.0 03/10/2019   GLUCOSE 133 (H) 03/10/2019   CHOL 130 03/10/2019   TRIG 362.0 (H) 03/10/2019   HDL 42.60 03/10/2019   LDLDIRECT 54.0 03/10/2019   LDLCALC 49 09/27/2018   ALT 36 03/10/2019   AST 24 03/10/2019   NA 142 03/10/2019   K 4.7 03/10/2019   CL 103 03/10/2019   CREATININE 0.98 03/10/2019   BUN 21 03/10/2019   CO2 30 03/10/2019   TSH 0.74 03/10/2019   PSA 3.57 03/10/2019   INR 1.08 03/25/2010   HGBA1C 7.3 (H) 03/10/2019   MICROALBUR 1.5 03/10/2019    Dg  Mandible 4 Views  Result Date: 03/10/2019 CLINICAL DATA:  Right-sided jaw pain for 4-5 days. EXAM: MANDIBLE - 4+ VIEW COMPARISON:  None. FINDINGS: There is no evidence of fracture or other focal bone lesions. IMPRESSION: Normal mandible. Electronically Signed  By: Francene BoyersJames  Maxwell M.D.   On: 03/10/2019 16:07    Assessment & Plan:   Davone was seen today for headache.  Diagnoses and all orders for this visit:  Intractable migraine without aura and with status migrainosus- He has at least one headache each week so I have asked him to start using a migraine preventative medication with a monthly injectable CGRP antagonist.  I have also recommended that he use an oral CGRP antagonist as needed for migraines. -     Galcanezumab-gnlm (EMGALITY) 120 MG/ML SOAJ; Inject 1 Act into the skin every 30 (thirty) days. -     Ubrogepant (UBRELVY) 100 MG TABS; Take 1 tablet by mouth as needed.  Episodic cluster headache, not intractable -     Galcanezumab-gnlm (EMGALITY) 120 MG/ML SOAJ; Inject 1 Act into the skin every 30 (thirty) days. -     Ubrogepant (UBRELVY) 100 MG TABS; Take 1 tablet by mouth as needed.   I am having Alwin S. Scafidi start on [place] and Ubrelvy. I am also having him maintain his glucose blood, OneTouch Delica Lancets 33G, sildenafil, rosuvastatin, meloxicam, omega-3 acid ethyl esters, Farxiga, and metFORMIN.  Meds ordered this encounter  Medications  . Galcanezumab-gnlm (EMGALITY) 120 MG/ML SOAJ    Sig: Inject 1 Act into the skin every 30 (thirty) days.    Dispense:  12 pen    Refill:  1  . Ubrogepant (UBRELVY) 100 MG TABS    Sig: Take 1 tablet by mouth as needed.    Dispense:  8 tablet    Refill:  5     Follow-up: No follow-ups on file.  Sanda Lingerhomas Davionte Lusby, MD

## 2019-04-14 NOTE — Patient Instructions (Signed)

## 2019-04-15 ENCOUNTER — Telehealth: Payer: Self-pay

## 2019-04-15 NOTE — Telephone Encounter (Signed)
Key: [id number]

## 2019-04-20 ENCOUNTER — Ambulatory Visit: Payer: Federal, State, Local not specified - PPO

## 2019-05-21 ENCOUNTER — Other Ambulatory Visit: Payer: Self-pay | Admitting: Internal Medicine

## 2019-05-21 DIAGNOSIS — N5201 Erectile dysfunction due to arterial insufficiency: Secondary | ICD-10-CM

## 2019-05-26 ENCOUNTER — Other Ambulatory Visit: Payer: Self-pay | Admitting: Internal Medicine

## 2019-05-26 DIAGNOSIS — M25562 Pain in left knee: Secondary | ICD-10-CM

## 2019-06-17 ENCOUNTER — Other Ambulatory Visit: Payer: Self-pay | Admitting: Internal Medicine

## 2019-06-17 DIAGNOSIS — E118 Type 2 diabetes mellitus with unspecified complications: Secondary | ICD-10-CM

## 2019-06-17 DIAGNOSIS — Z794 Long term (current) use of insulin: Secondary | ICD-10-CM

## 2019-06-17 DIAGNOSIS — E785 Hyperlipidemia, unspecified: Secondary | ICD-10-CM

## 2019-06-17 DIAGNOSIS — E781 Pure hyperglyceridemia: Secondary | ICD-10-CM

## 2019-07-08 ENCOUNTER — Other Ambulatory Visit: Payer: Self-pay | Admitting: Internal Medicine

## 2019-07-08 DIAGNOSIS — E118 Type 2 diabetes mellitus with unspecified complications: Secondary | ICD-10-CM

## 2019-07-12 ENCOUNTER — Telehealth: Payer: Self-pay

## 2019-07-12 NOTE — Telephone Encounter (Signed)
Key: [id number]

## 2019-08-18 ENCOUNTER — Encounter: Payer: Self-pay | Admitting: Internal Medicine

## 2019-08-19 ENCOUNTER — Other Ambulatory Visit: Payer: Self-pay | Admitting: Internal Medicine

## 2019-08-19 DIAGNOSIS — E118 Type 2 diabetes mellitus with unspecified complications: Secondary | ICD-10-CM

## 2019-08-25 DIAGNOSIS — G4733 Obstructive sleep apnea (adult) (pediatric): Secondary | ICD-10-CM | POA: Diagnosis not present

## 2019-08-31 ENCOUNTER — Ambulatory Visit: Payer: Federal, State, Local not specified - PPO | Admitting: Adult Health

## 2019-09-27 ENCOUNTER — Other Ambulatory Visit: Payer: Federal, State, Local not specified - PPO

## 2019-09-27 ENCOUNTER — Encounter: Payer: Self-pay | Admitting: Internal Medicine

## 2019-09-27 ENCOUNTER — Ambulatory Visit: Payer: Federal, State, Local not specified - PPO | Admitting: Internal Medicine

## 2019-09-27 ENCOUNTER — Other Ambulatory Visit: Payer: Self-pay

## 2019-09-27 VITALS — BP 118/76 | HR 78 | Temp 98.0°F | Ht 66.0 in | Wt 183.5 lb

## 2019-09-27 DIAGNOSIS — E781 Pure hyperglyceridemia: Secondary | ICD-10-CM

## 2019-09-27 DIAGNOSIS — N5201 Erectile dysfunction due to arterial insufficiency: Secondary | ICD-10-CM | POA: Diagnosis not present

## 2019-09-27 DIAGNOSIS — E118 Type 2 diabetes mellitus with unspecified complications: Secondary | ICD-10-CM

## 2019-09-27 LAB — BASIC METABOLIC PANEL
BUN: 20 mg/dL (ref 6–23)
CO2: 27 mEq/L (ref 19–32)
Calcium: 8.5 mg/dL (ref 8.4–10.5)
Chloride: 106 mEq/L (ref 96–112)
Creatinine, Ser: 0.88 mg/dL (ref 0.40–1.50)
GFR: 89.7 mL/min (ref >60.00)
Glucose, Bld: 150 mg/dL — ABNORMAL HIGH (ref 70–99)
Potassium: 3.8 mEq/L (ref 3.5–5.1)
Sodium: 138 mEq/L (ref 135–145)

## 2019-09-27 LAB — TRIGLYCERIDES: Triglycerides: 91 mg/dL (ref 0.0–149.0)

## 2019-09-27 LAB — HEMOGLOBIN A1C: Hgb A1c MFr Bld: 8.5 % — ABNORMAL HIGH (ref 4.6–6.5)

## 2019-09-27 MED ORDER — RYBELSUS 3 MG PO TABS: 1.0000 | ORAL_TABLET | Freq: Every day | ORAL | 0 refills | Status: DC

## 2019-09-27 NOTE — Progress Notes (Signed)
Subjective:  Patient ID: Nathan Park, male    DOB: June 27, 1966  Age: 54 y.o. MRN: [id number]  CC: Hyperlipidemia and Diabetes  This visit occurred during the SARS-CoV-2 public health emergency.  Safety protocols were in place, including screening questions prior to the visit, additional usage of staff PPE, and extensive cleaning of exam room while observing appropriate contact time as indicated for disinfecting solutions.    HPI Nathan Park presents for f/up - He complains of erectile dysfunction and wants to know if his testosterone level is low.  He says his libido is pretty good.  He gets moderate improvement in erections with sildenafil.  He feels like his blood sugars have been well controlled but he has not been exercising much recently.  He denies polys.  Outpatient Medications Prior to Visit  Medication Sig Dispense Refill  . FARXIGA 10 MG TABS tablet TAKE 1 TABLET EVERY DAY 90 tablet 1  . Galcanezumab-gnlm (EMGALITY) 120 MG/ML SOAJ Inject 1 Act into the skin every 30 (thirty) days. 12 pen 1  . glucose blood (ONE TOUCH ULTRA TEST) test strip Use as instructed 100 each 12  . metFORMIN (GLUCOPHAGE-XR) 750 MG 24 hr tablet TAKE 2 TABLETS EVERY DAY WITH BREAKFAST 180 tablet 0  . omega-3 acid ethyl esters (LOVAZA) 1 g capsule TAKE 2 CAPSULES (2 G TOTAL) BY MOUTH 2 (TWO) TIMES DAILY. 360 capsule 1  . ONETOUCH DELICA LANCETS 36R MISC Test up to BID. DX: E11.9 200 each 4  . rosuvastatin (CRESTOR) 5 MG tablet TAKE 1 TABLET BY MOUTH EVERY DAY 90 tablet 1  . sildenafil (REVATIO) 20 MG tablet TAKE 4 TABLETS (80 MG TOTAL) BY MOUTH DAILY AS NEEDED. 60 tablet 11  . Ubrogepant (UBRELVY) 100 MG TABS Take 1 tablet by mouth as needed. (Patient not taking: Reported on 09/27/2019) 8 tablet 5  . meloxicam (MOBIC) 15 MG tablet TAKE 1 TABLET BY MOUTH EVERY DAY (Patient not taking: Reported on 09/27/2019) 90 tablet 0   No facility-administered medications prior to visit.    ROS Review of Systems   Constitutional: Negative for diaphoresis, fatigue and unexpected weight change.  HENT: Negative.   Eyes: Negative for visual disturbance.  Respiratory: Negative for cough, chest tightness, shortness of breath and wheezing.   Cardiovascular: Negative for chest pain, palpitations and leg swelling.  Gastrointestinal: Negative for abdominal pain, constipation, diarrhea, nausea and vomiting.  Endocrine: Negative.  Negative for polydipsia, polyphagia and polyuria.  Genitourinary: Negative.  Negative for difficulty urinating.       +ED  Musculoskeletal: Negative for arthralgias and myalgias.  Skin: Negative.  Negative for color change.  Neurological: Negative.  Negative for dizziness, weakness and light-headedness.  Hematological: Negative for adenopathy. Does not bruise/bleed easily.  Psychiatric/Behavioral: Negative.     Objective:  BP 118/76 (BP Location: Left Arm, Patient Position: Sitting, Cuff Size: Large)   Pulse 78   Temp 98 F (36.7 C) (Oral)   Ht 5\' 6"  (1.676 m)   Wt 183 lb 8 oz (83.2 kg)   SpO2 97%   BMI 29.62 kg/m   BP Readings from Last 3 Encounters:  09/27/19 118/76  04/14/19 122/82  03/10/19 118/78    Wt Readings from Last 3 Encounters:  09/27/19 183 lb 8 oz (83.2 kg)  04/14/19 188 lb (85.3 kg)  03/10/19 187 lb 1.9 oz (84.9 kg)    Physical Exam Vitals reviewed.  Constitutional:      Appearance: Normal appearance.  HENT:     Nose:  Nose normal.     Mouth/Throat:     Mouth: Mucous membranes are moist.  Eyes:     General: No scleral icterus.    Conjunctiva/sclera: Conjunctivae normal.  Cardiovascular:     Rate and Rhythm: Normal rate and regular rhythm.     Heart sounds: No murmur.  Pulmonary:     Effort: Pulmonary effort is normal.     Breath sounds: No stridor. No wheezing, rhonchi or rales.  Abdominal:     General: Abdomen is flat. Bowel sounds are normal. There is no distension.     Palpations: Abdomen is soft. There is no hepatomegaly, splenomegaly  or mass.     Tenderness: There is no abdominal tenderness.  Musculoskeletal:        General: Normal range of motion.     Cervical back: Neck supple.     Right lower leg: No edema.     Left lower leg: No edema.  Lymphadenopathy:     Cervical: No cervical adenopathy.  Skin:    General: Skin is warm and dry.  Neurological:     General: No focal deficit present.     Mental Status: He is alert.  Psychiatric:        Mood and Affect: Mood normal.        Behavior: Behavior normal.     Lab Results  Component Value Date   WBC 7.7 03/10/2019   HGB 16.8 03/10/2019   HCT 49.6 03/10/2019   PLT 201.0 03/10/2019   GLUCOSE 150 (H) 09/27/2019   CHOL 130 03/10/2019   TRIG 91.0 09/27/2019   HDL 42.60 03/10/2019   LDLDIRECT 54.0 03/10/2019   LDLCALC 49 09/27/2018   ALT 36 03/10/2019   AST 24 03/10/2019   NA 138 09/27/2019   K 3.8 09/27/2019   CL 106 09/27/2019   CREATININE 0.88 09/27/2019   BUN 20 09/27/2019   CO2 27 09/27/2019   TSH 0.74 03/10/2019   PSA 3.57 03/10/2019   INR 1.08 03/25/2010   HGBA1C 8.5 (H) 09/27/2019   MICROALBUR 1.5 03/10/2019    DG Mandible 4 Views  Result Date: 03/10/2019 CLINICAL DATA:  Right-sided jaw pain for 4-5 days. EXAM: MANDIBLE - 4+ VIEW COMPARISON:  None. FINDINGS: There is no evidence of fracture or other focal bone lesions. IMPRESSION: Normal mandible. Electronically Signed   By: Francene Boyers M.D.   On: 03/10/2019 16:07    Assessment & Plan:   Raza was seen today for hyperlipidemia and diabetes.  Diagnoses and all orders for this visit:  Type II diabetes mellitus with manifestations (HCC)- His A1c is up to 8.5%.  In addition to lifestyle modifications I have asked him to add a GLP-1 agonist to his current regimen. -     Basic metabolic panel; Future -     Hemoglobin A1c; Future -     Ambulatory referral to Ophthalmology -     Semaglutide (RYBELSUS) 3 MG TABS; Take 1 tablet by mouth daily.  Pure hypertriglyceridemia- Improvement noted. -      Triglycerides; Future  Erectile dysfunction due to arterial insufficiency- His testosterone level is normal.  Will continue the current dose of sildenafil. -     Cancel: Testosterone Total,Free,Bio, Males -     Testosterone Total,Free,Bio, Males; Future   I have discontinued Nathan Park's meloxicam. I am also having him start on Rybelsus. Additionally, I am having him maintain his glucose blood, OneTouch Delica Lancets 33G, Emgality, Ubrelvy, sildenafil, rosuvastatin, omega-3 acid ethyl esters, Marcelline Deist,  and metFORMIN.  Meds ordered this encounter  Medications  . Semaglutide (RYBELSUS) 3 MG TABS    Sig: Take 1 tablet by mouth daily.    Dispense:  30 tablet    Refill:  0     Follow-up: Return in about 6 months (around 03/26/2020).  Sanda Linger, MD

## 2019-09-27 NOTE — Patient Instructions (Signed)
Type 2 Diabetes Mellitus, Diagnosis, Adult Type 2 diabetes (type 2 diabetes mellitus) is a long-term (chronic) disease. In type 2 diabetes, one or both of these problems may be present:  The pancreas does not make enough of a hormone called insulin.  Cells in the body do not respond properly to insulin that the body makes (insulin resistance). Normally, insulin allows blood sugar (glucose) to enter cells in the body. The cells use glucose for energy. Insulin resistance or lack of insulin causes excess glucose to build up in the blood instead of going into cells. As a result, high blood glucose (hyperglycemia) develops. What increases the risk? The following factors may make you more likely to develop type 2 diabetes:  Having a family member with type 2 diabetes.  Being overweight or obese.  Having an inactive (sedentary) lifestyle.  Having been diagnosed with insulin resistance.  Having a history of prediabetes, gestational diabetes, or polycystic ovary syndrome (PCOS).  Being of American-Indian, African-American, Hispanic/Latino, or Asian/Pacific Islander descent. What are the signs or symptoms? In the early stage of this condition, you may not have symptoms. Symptoms develop slowly and may include:  Increased thirst (polydipsia).  Increased hunger(polyphagia).  Increased urination (polyuria).  Increased urination during the night (nocturia).  Unexplained weight loss.  Frequent infections that keep coming back (recurring).  Fatigue.  Weakness.  Vision changes, such as blurry vision.  Cuts or bruises that are slow to heal.  Tingling or numbness in the hands or feet.  Dark patches on the skin (acanthosis nigricans). How is this diagnosed? This condition is diagnosed based on your symptoms, your medical history, a physical exam, and your blood glucose level. Your blood glucose may be checked with one or more of the following blood tests:  A fasting blood glucose (FBG)  test. You will not be allowed to eat (you will fast) for 8 hours or longer before a blood sample is taken.  A random blood glucose test. This test checks blood glucose at any time of day regardless of when you ate.  An A1c (hemoglobin A1c) blood test. This test provides information about blood glucose control over the previous 2-3 months.  An oral glucose tolerance test (OGTT). This test measures your blood glucose at two times: ? After fasting. This is your baseline blood glucose level. ? Two hours after drinking a beverage that contains glucose. You may be diagnosed with type 2 diabetes if:  Your FBG level is 126 mg/dL (7.0 mmol/L) or higher.  Your random blood glucose level is 200 mg/dL (11.1 mmol/L) or higher.  Your A1c level is 6.5% or higher.  Your OGTT result is higher than 200 mg/dL (11.1 mmol/L). These blood tests may be repeated to confirm your diagnosis. How is this treated? Your treatment may be managed by a specialist called an endocrinologist. Type 2 diabetes may be treated by following instructions from your health care provider about:  Making diet and lifestyle changes. This may include: ? Following an individualized nutrition plan that is developed by a diet and nutrition specialist (registered dietitian). ? Exercising regularly. ? Finding ways to manage stress.  Checking your blood glucose level as often as told.  Taking diabetes medicines or insulin daily. This helps to keep your blood glucose levels in the healthy range. ? If you use insulin, you may need to adjust the dosage depending on how physically active you are and what foods you eat. Your health care provider will tell you how to adjust your dosage.    Taking medicines to help prevent complications from diabetes, such as: ? Aspirin. ? Medicine to lower cholesterol. ? Medicine to control blood pressure. Your health care provider will set individualized treatment goals for you. Your goals will be based on  your age, other medical conditions you have, and how you respond to diabetes treatment. Generally, the goal of treatment is to maintain the following blood glucose levels:  Before meals (preprandial): 80-130 mg/dL (4.4-7.2 mmol/L).  After meals (postprandial): below 180 mg/dL (10 mmol/L).  A1c level: less than 7%. Follow these instructions at home: Questions to ask your health care provider  Consider asking the following questions: ? Do I need to meet with a diabetes educator? ? Where can I find a support group for people with diabetes? ? What equipment will I need to manage my diabetes at home? ? What diabetes medicines do I need, and when should I take them? ? How often do I need to check my blood glucose? ? What number can I call if I have questions? ? When is my next appointment? General instructions  Take over-the-counter and prescription medicines only as told by your health care provider.  Keep all follow-up visits as told by your health care provider. This is important.  For more information about diabetes, visit: ? American Diabetes Association (ADA): www.diabetes.org ? American Association of Diabetes Educators (AADE): www.diabeteseducator.org Contact a health care provider if:  Your blood glucose is at or above 240 mg/dL (13.3 mmol/L) for 2 days in a row.  You have been sick or have had a fever for 2 days or longer, and you are not getting better.  You have any of the following problems for more than 6 hours: ? You cannot eat or drink. ? You have nausea and vomiting. ? You have diarrhea. Get help right away if:  Your blood glucose is lower than 54 mg/dL (3.0 mmol/L).  You become confused or you have trouble thinking clearly.  You have difficulty breathing.  You have moderate or large ketone levels in your urine. Summary  Type 2 diabetes (type 2 diabetes mellitus) is a long-term (chronic) disease. In type 2 diabetes, the pancreas does not make enough of a  hormone called insulin, or cells in the body do not respond properly to insulin that the body makes (insulin resistance).  This condition is treated by making diet and lifestyle changes and taking diabetes medicines or insulin.  Your health care provider will set individualized treatment goals for you. Your goals will be based on your age, other medical conditions you have, and how you respond to diabetes treatment.  Keep all follow-up visits as told by your health care provider. This is important. This information is not intended to replace advice given to you by your health care provider. Make sure you discuss any questions you have with your health care provider. Document Revised: 10/02/2017 Document Reviewed: 09/07/2015 Elsevier Patient Education  2020 Elsevier Inc.  

## 2019-09-28 ENCOUNTER — Encounter: Payer: Self-pay | Admitting: Internal Medicine

## 2019-09-28 DIAGNOSIS — N5201 Erectile dysfunction due to arterial insufficiency: Secondary | ICD-10-CM

## 2019-09-28 LAB — TESTOSTERONE TOTAL,FREE,BIO, MALES
Albumin: 4.5 g/dL (ref 3.6–5.1)
Sex Hormone Binding: 15 nmol/L (ref 10–50)
Testosterone, Bioavailable: 159.6 ng/dL (ref >=110.0)
Testosterone, Free: 77.6 pg/mL (ref 46.0–224.0)
Testosterone: 343 ng/dL (ref 250–827)

## 2019-09-30 ENCOUNTER — Other Ambulatory Visit: Payer: Self-pay | Admitting: Internal Medicine

## 2019-09-30 DIAGNOSIS — N5201 Erectile dysfunction due to arterial insufficiency: Secondary | ICD-10-CM

## 2019-09-30 MED ORDER — TADALAFIL 20 MG PO TABS: 20.0000 mg | ORAL_TABLET | Freq: Every day | ORAL | 5 refills | Status: DC | PRN

## 2019-09-30 NOTE — Telephone Encounter (Signed)
    Patient request RX for tadalafil (CIALIS) 20 MG tablet be sent to [facility]

## 2019-10-04 ENCOUNTER — Encounter: Payer: Self-pay | Admitting: Internal Medicine

## 2019-10-17 ENCOUNTER — Telehealth: Payer: Self-pay

## 2019-10-17 NOTE — Telephone Encounter (Signed)
Key: [id number]

## 2019-10-24 ENCOUNTER — Other Ambulatory Visit: Payer: Self-pay

## 2019-10-24 DIAGNOSIS — E118 Type 2 diabetes mellitus with unspecified complications: Secondary | ICD-10-CM

## 2019-10-24 MED ORDER — METFORMIN HCL ER 750 MG PO TB24: 1500.0000 mg | ORAL_TABLET | Freq: Every day | ORAL | 3 refills | Status: DC

## 2019-11-03 ENCOUNTER — Ambulatory Visit: Payer: Federal, State, Local not specified - PPO | Attending: Internal Medicine

## 2019-11-03 DIAGNOSIS — Z23 Encounter for immunization: Secondary | ICD-10-CM

## 2019-11-03 NOTE — Progress Notes (Signed)
   Covid-19 Vaccination Clinic  Name:  [name]    MRN: [id number] DOB: 1965/10/28  11/03/2019  [name] was observed post Covid-19 immunization for 15 minutes without incident. He was provided with Vaccine Information Sheet and instruction to access the V-Safe system.   [name] was instructed to call 911 with any severe reactions post vaccine: Marland Kitchen Difficulty breathing  . Swelling of face and throat  . A fast heartbeat  . A bad rash all over body  . Dizziness and weakness   Immunizations Administered    Name Date Dose VIS Date Route   Pfizer COVID-19 Vaccine 11/03/2019  8:18 AM 0.3 mL 07/29/2019 Intramuscular   Manufacturer: [employer], [employer]   Lot: [id number]   NDC: [address]-1

## 2019-11-07 ENCOUNTER — Encounter: Payer: Self-pay | Admitting: Adult Health

## 2019-11-09 ENCOUNTER — Ambulatory Visit: Payer: Federal, State, Local not specified - PPO | Admitting: Adult Health

## 2019-11-09 ENCOUNTER — Other Ambulatory Visit: Payer: Self-pay

## 2019-11-09 ENCOUNTER — Encounter: Payer: Self-pay | Admitting: Adult Health

## 2019-11-09 VITALS — BP 112/69 | HR 69 | Temp 97.3°F | Ht 66.0 in | Wt 187.0 lb

## 2019-11-09 DIAGNOSIS — Z9989 Dependence on other enabling machines and devices: Secondary | ICD-10-CM | POA: Diagnosis not present

## 2019-11-09 DIAGNOSIS — G4733 Obstructive sleep apnea (adult) (pediatric): Secondary | ICD-10-CM

## 2019-11-09 NOTE — Patient Instructions (Signed)
Continue using CPAP nightly and greater than 4 hours each night °If your symptoms worsen or you develop new symptoms please let us know.  ° °

## 2019-11-09 NOTE — Progress Notes (Signed)
PATIENT: Nathan Park DOB: July 29, 1966  REASON FOR VISIT: follow up HISTORY FROM: patient  HISTORY OF PRESENT ILLNESS: Today 11/09/19:  Nathan Park is a 54 year old male with a history of obstructive sleep apnea on CPAP.  His download indicates that he uses machine 20 out of 30 days for compliance of 67%.  He uses machine greater than 4 hours only 9 days for compliance of 30%.  On average he uses his machine 4 hours and 16 minutes.  His residual AHI of 3.3 on 5 to 12 cm of water with EPR of 1.  Leak in the 95th percentile is 35.5 L/min.  He states that there are some nights he wakes up and the mask is off.  He states that he has been working on this.  HISTORY 02/28/19:  Nathan Park is a 54 year old male with a history of obstructive sleep apnea on CPAP.  He returns today for follow-up.  He reports that he is not been using the CPAP machine.  States that he had lost weight and stop snoring so he stopped using it.  He states that since COVID-19 he has gained weight and began snoring again.  He states he also was waiting on new supplies and stopped using it because his mask was leaking.  He states that he now notices the benefit of the CPAP since he stopped using it.  He has got new supplies and plans to restart the CPAP.   REVIEW OF SYSTEMS: Out of a complete 14 system review of symptoms, the patient complains only of the following symptoms, and all other reviewed systems are negative.  See HPI  ALLERGIES: Allergies  Allergen Reactions  . Morphine And Related     "Does not work for me"  . Oxycodone Hcl Itching and Nausea Only    HOME MEDICATIONS: Outpatient Medications Prior to Visit  Medication Sig Dispense Refill  . FARXIGA 10 MG TABS tablet TAKE 1 TABLET EVERY DAY 90 tablet 1  . Galcanezumab-gnlm (EMGALITY) 120 MG/ML SOAJ Inject 1 Act into the skin every 30 (thirty) days. 12 pen 1  . glucose blood (ONE TOUCH ULTRA TEST) test strip Use as instructed 100 each 12  . metFORMIN  (GLUCOPHAGE-XR) 750 MG 24 hr tablet Take 2 tablets (1,500 mg total) by mouth daily with breakfast. 180 tablet 3  . omega-3 acid ethyl esters (LOVAZA) 1 g capsule TAKE 2 CAPSULES (2 G TOTAL) BY MOUTH 2 (TWO) TIMES DAILY. 360 capsule 1  . ONETOUCH DELICA LANCETS 33G MISC Test up to BID. DX: E11.9 200 each 4  . rosuvastatin (CRESTOR) 5 MG tablet TAKE 1 TABLET BY MOUTH EVERY DAY 90 tablet 1  . Semaglutide (RYBELSUS) 3 MG TABS Take 1 tablet by mouth daily. 30 tablet 0  . tadalafil (CIALIS) 20 MG tablet Take 1 tablet (20 mg total) by mouth daily as needed for erectile dysfunction. 10 tablet 5  . Ubrogepant (UBRELVY) 100 MG TABS Take 1 tablet by mouth as needed. (Patient not taking: Reported on 09/27/2019) 8 tablet 5   No facility-administered medications prior to visit.    PAST MEDICAL HISTORY: Past Medical History:  Diagnosis Date  . Barrett esophagus 01/31/10   at 32 cm  . Complication of anesthesia    slow to wake up  . Diabetes mellitus   . Diverticulosis   . GERD (gastroesophageal reflux disease)   . Headache(784.0)   . Hemorrhoids   . Hiatal hernia   . Hiatal hernia   . Nasal  congestion   . Rectal pain   . Refusal of blood transfusions as patient is Jehovah's Witness   . Umbilical hernia     PAST SURGICAL HISTORY: Past Surgical History:  Procedure Laterality Date  . CARPAL TUNNEL RELEASE    . LAPAROSCOPIC NISSEN FUNDOPLICATION  04/20/8181   Procedure: LAPAROSCOPIC NISSEN FUNDOPLICATION;  Surgeon: Pedro Earls, MD;  Location: [facility];  Service: General;  Laterality: N/A;  . ORIF DISTAL RADIUS FRACTURE  2009  . repair umblical hernia repair  04/01/10  . sigmoid colectomy  04/01/10  . take down of colovesical fistula  04/01/10  . takedown of enterovesical fistula  04/01/10  . WRIST SURGERY     left    FAMILY HISTORY: Family History  Problem Relation Age of Onset  . Diabetes Father   . Colon cancer Neg Hx   . Cancer Neg Hx   . Heart disease Neg Hx   . Hypertension Neg Hx     . Kidney disease Neg Hx     SOCIAL HISTORY: Social History   Socioeconomic History  . Marital status: Married    Spouse name: Not on file  . Number of children: 4  . Years of education: Not on file  . Highest education level: Not on file  Occupational History    Employer: [employer]  Tobacco Use  . Smoking status: Never Smoker  . Smokeless tobacco: Never Used  Substance and Sexual Activity  . Alcohol use: Yes    Alcohol/week: 5.0 standard drinks    Types: 3 Glasses of wine, 2 Shots of liquor per week    Comment: 2 drinks a week   . Drug use: No  . Sexual activity: Yes  Other Topics Concern  . Not on file  Social History Narrative   No caffeine drinks    Social Determinants of Health   Financial Resource Strain:   . Difficulty of Paying Living Expenses:   Food Insecurity:   . Worried About Charity fundraiser in the Last Year:   . Arboriculturist in the Last Year:   Transportation Needs:   . Film/video editor (Medical):   Marland Kitchen Lack of Transportation (Non-Medical):   Physical Activity:   . Days of Exercise per Week:   . Minutes of Exercise per Session:   Stress:   . Feeling of Stress :   Social Connections:   . Frequency of Communication with Friends and Family:   . Frequency of Social Gatherings with Friends and Family:   . Attends Religious Services:   . Active Member of Clubs or Organizations:   . Attends Archivist Meetings:   Marland Kitchen Marital Status:   Intimate Partner Violence:   . Fear of Current or Ex-Partner:   . Emotionally Abused:   Marland Kitchen Physically Abused:   . Sexually Abused:       PHYSICAL EXAM  Vitals:   11/09/19 0906  BP: 112/69  Pulse: 69  Temp: (!) 97.3 F (36.3 C)  Weight: 187 lb (84.8 kg)  Height: 5\' 6"  (1.676 m)   Body mass index is 30.18 kg/m.  Generalized: Well developed, in no acute distress  Chest: Lungs clear to auscultation bilaterally  Neurological examination  Mentation: Alert oriented to time, place,  history taking. Follows all commands speech and language fluent Cranial nerve II-XII: Extraocular movements were full, visual field were full on confrontational test Head turning and shoulder shrug  were normal and symmetric. Motor: The motor testing  reveals 5 over 5 strength of all 4 extremities. Good symmetric motor tone is noted throughout.  Sensory: Sensory testing is intact to soft touch on all 4 extremities. No evidence of extinction is noted.  Gait and station: Gait is normal.    DIAGNOSTIC DATA (LABS, IMAGING, TESTING) - I reviewed patient records, labs, notes, testing and imaging myself where available.  Lab Results  Component Value Date   WBC 7.7 03/10/2019   HGB 16.8 03/10/2019   HCT 49.6 03/10/2019   MCV 90.1 03/10/2019   PLT 201.0 03/10/2019      Component Value Date/Time   NA 138 09/27/2019 0857   NA 141 04/19/2016 0000   K 3.8 09/27/2019 0857   CL 106 09/27/2019 0857   CO2 27 09/27/2019 0857   GLUCOSE 150 (H) 09/27/2019 0857   BUN 20 09/27/2019 0857   BUN 16 04/19/2016 0000   CREATININE 0.88 09/27/2019 0857   CALCIUM 8.5 09/27/2019 0857   PROT 7.1 03/10/2019 1156   ALBUMIN 5.0 03/10/2019 1156   AST 24 03/10/2019 1156   ALT 36 03/10/2019 1156   ALKPHOS 60 03/10/2019 1156   BILITOT 0.5 03/10/2019 1156   GFRNONAA >90 08/27/2012 0407   GFRAA >90 08/27/2012 0407   Lab Results  Component Value Date   CHOL 130 03/10/2019   HDL 42.60 03/10/2019   LDLCALC 49 09/27/2018   LDLDIRECT 54.0 03/10/2019   TRIG 91.0 09/27/2019   CHOLHDL 3 03/10/2019   Lab Results  Component Value Date   HGBA1C 8.5 (H) 09/27/2019   No results found for: VITAMINB12 Lab Results  Component Value Date   TSH 0.74 03/10/2019      ASSESSMENT AND PLAN 54 y.o. year old male  has a past medical history of Barrett esophagus (01/31/10), Complication of anesthesia, Diabetes mellitus, Diverticulosis, GERD (gastroesophageal reflux disease), Headache(784.0), Hemorrhoids, Hiatal hernia, Hiatal  hernia, Nasal congestion, Rectal pain, Refusal of blood transfusions as patient is Jehovah's Witness, and Umbilical hernia. here with:  1. OSA on CPAP  - CPAP compliance is suboptimal - Good treatment of AHI  - Encourage patient to use CPAP nightly and > 4 hours each night - F/U in 6 months or sooner if needed   I spent 20 minutes of face-to-face and non-face-to-face time with patient.  This included previsit chart review, study review, order entry, electronic health record documentation, patient education.  Butch Penny, MSN, NP-C 11/09/2019, 8:47 AM [facility] [address] [place], [place] [address] [phone]

## 2019-11-23 DIAGNOSIS — G4733 Obstructive sleep apnea (adult) (pediatric): Secondary | ICD-10-CM | POA: Diagnosis not present

## 2019-11-28 ENCOUNTER — Ambulatory Visit: Payer: Federal, State, Local not specified - PPO | Attending: Internal Medicine

## 2019-11-28 DIAGNOSIS — Z23 Encounter for immunization: Secondary | ICD-10-CM

## 2019-11-28 NOTE — Progress Notes (Signed)
   Covid-19 Vaccination Clinic  Name:  SAID RUEB    MRN: [id number] DOB: Apr 13, 1966  11/28/2019  Mr. Gautreau was observed post Covid-19 immunization for 15 minutes without incident. He was provided with Vaccine Information Sheet and instruction to access the V-Safe system.   Mr. Garrette was instructed to call 911 with any severe reactions post vaccine: Marland Kitchen Difficulty breathing  . Swelling of face and throat  . A fast heartbeat  . A bad rash all over body  . Dizziness and weakness   Immunizations Administered    Name Date Dose VIS Date Route   Pfizer COVID-19 Vaccine 11/28/2019  8:10 AM 0.3 mL 07/29/2019 Intramuscular   Manufacturer: [employer], [employer]   Lot: [id number]   NDC: [address]-3

## 2020-01-06 ENCOUNTER — Telehealth: Payer: Self-pay | Admitting: Orthopaedic Surgery

## 2020-01-06 NOTE — Telephone Encounter (Signed)
Patient called asked if he can be added to the cancellation list for an earlier appointment next week? Patient is scheduled 01/13/2020 with Dr Roda Shutters.   The number to contact patient is [phone]

## 2020-01-06 NOTE — Telephone Encounter (Signed)
Holding for you. Schedule is at 100%.

## 2020-01-09 NOTE — Telephone Encounter (Signed)
Called him to let him know we do not have a cx list. He is more than welcome to call us everyday/multiple times to see if there is any cancellations. No answer LMOM.

## 2020-01-13 ENCOUNTER — Ambulatory Visit: Payer: Federal, State, Local not specified - PPO | Admitting: Orthopaedic Surgery

## 2020-01-13 ENCOUNTER — Encounter: Payer: Self-pay | Admitting: Internal Medicine

## 2020-01-17 ENCOUNTER — Ambulatory Visit: Payer: Federal, State, Local not specified - PPO | Admitting: Orthopaedic Surgery

## 2020-01-17 ENCOUNTER — Other Ambulatory Visit: Payer: Self-pay

## 2020-01-17 ENCOUNTER — Other Ambulatory Visit: Payer: Self-pay | Admitting: Internal Medicine

## 2020-01-17 DIAGNOSIS — S83242D Other tear of medial meniscus, current injury, left knee, subsequent encounter: Secondary | ICD-10-CM | POA: Diagnosis not present

## 2020-01-17 MED ORDER — BUPIVACAINE HCL 0.5 % IJ SOLN
2.0000 mL | INTRAMUSCULAR | Status: AC | PRN
  Administered 2020-01-17: 2 mL via INTRA_ARTICULAR

## 2020-01-17 MED ORDER — LIDOCAINE HCL 1 % IJ SOLN
2.0000 mL | INTRAMUSCULAR | Status: AC | PRN
  Administered 2020-01-17: 2 mL

## 2020-01-17 MED ORDER — METHYLPREDNISOLONE ACETATE 40 MG/ML IJ SUSP
40.0000 mg | INTRAMUSCULAR | Status: AC | PRN
  Administered 2020-01-17: 40 mg via INTRA_ARTICULAR

## 2020-01-17 NOTE — Progress Notes (Signed)
Office Visit Note   Patient: Nathan Park           Date of Birth: May 11, 1966           MRN: [id number] Visit Date: 01/17/2020              Requested by: Etta Grandchild, MD [address] [place],  [place] [address] PCP: Etta Grandchild, MD   Assessment & Plan: Visit Diagnoses:  1. Acute medial meniscus tear of left knee, subsequent encounter     Plan: Impression is symptomatic left medial meniscal tear.  He is not having mechanical symptoms on effusion, only pain therefore based on discussion he would like to try cortisone injection.  He tolerated this well.  Patient will follow up with [place] in a couple weeks if he does not notice any improvement.  Follow-Up Instructions: No follow-ups on file.   Orders:  No orders of the defined types were placed in this encounter.  No orders of the defined types were placed in this encounter.     Procedures: Large Joint Inj: L knee on 01/17/2020 4:58 PM Details: 22 G needle Medications: 2 mL bupivacaine 0.5 %; 2 mL lidocaine 1 %; 40 mg methylPREDNISolone acetate 40 MG/ML Outcome: tolerated well, no immediate complications Patient was prepped and draped in the usual sterile fashion.       Clinical Data: No additional findings.   Subjective: Chief Complaint  Patient presents with   Left Knee - Pain    Nathan Park returns today for recurrent left knee pain for the last 3 weeks.  He has pain on the medial side.  Denies any mechanical symptoms.  He states he has a sharp pain in the same location as the meniscus tear that we found on MRI in 2020.   Review of Systems  Constitutional: Negative.   All other systems reviewed and are negative.    Objective: Vital Signs: There were no vitals taken for this visit.  Physical Exam Vitals and nursing note reviewed.  Constitutional:      Appearance: He is well-developed.  Pulmonary:     Effort: Pulmonary effort is normal.  Abdominal:     Palpations: Abdomen is soft.  Skin:  General: Skin is warm.  Neurological:     Mental Status: He is alert and oriented to person, place, and time.  Psychiatric:        Behavior: Behavior normal.        Thought Content: Thought content normal.        Judgment: Judgment normal.     Ortho Exam Left knee shows no joint effusion.  Medial joint line tenderness.  Positive McMurray. Specialty Comments:  No specialty comments available.  Imaging: No results found.   PMFS History: Patient Active Problem List   Diagnosis Date Noted   Erectile dysfunction due to arterial insufficiency 09/27/2019   Pure hypertriglyceridemia 03/10/2019   Episodic cluster headache, not intractable 08/24/2017   Routine general medical examination at a health care facility 04/17/2016   Chronic pansinusitis 01/08/2015   Erectile dysfunction 10/26/2013   Migraine headache without aura 05/04/2013   Refusal of blood transfusions as patient is Jehovah's Witness 03/12/2012   Barrett's esophagus 12/03/2011   Allergic rhinitis 10/16/2011   Hyperlipidemia LDL goal <70 10/16/2011   Type II diabetes mellitus with manifestations (HCC) 05/22/2011   Past Medical History:  Diagnosis Date   Barrett esophagus 01/31/10   at 32 cm   Complication of anesthesia  slow to wake up   Diabetes mellitus    Diverticulosis    GERD (gastroesophageal reflux disease)    Headache(784.0)    Hemorrhoids    Hiatal hernia    Hiatal hernia    Nasal congestion    Rectal pain    Refusal of blood transfusions as patient is Jehovah's Witness    Umbilical hernia     Family History  Problem Relation Age of Onset   Diabetes Father    Colon cancer Neg Hx    Cancer Neg Hx    Heart disease Neg Hx    Hypertension Neg Hx    Kidney disease Neg Hx     Past Surgical History:  Procedure Laterality Date   CARPAL TUNNEL RELEASE     LAPAROSCOPIC NISSEN FUNDOPLICATION  03/24/5642   Procedure: LAPAROSCOPIC NISSEN FUNDOPLICATION;  Surgeon: Pedro Earls, MD;  Location: [facility];  Service: General;  Laterality: N/A;   ORIF DISTAL RADIUS FRACTURE  3295   repair umblical hernia repair  04/01/10   sigmoid colectomy  04/01/10   take down of colovesical fistula  04/01/10   takedown of enterovesical fistula  04/01/10   WRIST SURGERY     left   Social History   Occupational History    Employer: [employer]  Tobacco Use   Smoking status: Never Smoker   Smokeless tobacco: Never Used  Substance and Sexual Activity   Alcohol use: Yes    Alcohol/week: 5.0 standard drinks    Types: 3 Glasses of wine, 2 Shots of liquor per week    Comment: 2 drinks a week    Drug use: No   Sexual activity: Yes

## 2020-01-18 ENCOUNTER — Other Ambulatory Visit: Payer: Self-pay | Admitting: Internal Medicine

## 2020-01-18 DIAGNOSIS — Z794 Long term (current) use of insulin: Secondary | ICD-10-CM

## 2020-01-18 DIAGNOSIS — E781 Pure hyperglyceridemia: Secondary | ICD-10-CM

## 2020-01-18 DIAGNOSIS — E785 Hyperlipidemia, unspecified: Secondary | ICD-10-CM

## 2020-02-20 ENCOUNTER — Encounter: Payer: Self-pay | Admitting: Internal Medicine

## 2020-02-21 ENCOUNTER — Other Ambulatory Visit: Payer: Self-pay | Admitting: Internal Medicine

## 2020-02-21 DIAGNOSIS — E118 Type 2 diabetes mellitus with unspecified complications: Secondary | ICD-10-CM

## 2020-02-21 MED ORDER — RYBELSUS 7 MG PO TABS: 1.0000 | ORAL_TABLET | Freq: Every day | ORAL | 0 refills | Status: DC

## 2020-02-23 ENCOUNTER — Other Ambulatory Visit: Payer: Self-pay | Admitting: Internal Medicine

## 2020-02-23 DIAGNOSIS — E118 Type 2 diabetes mellitus with unspecified complications: Secondary | ICD-10-CM

## 2020-02-23 MED ORDER — OZEMPIC (1 MG/DOSE) 2 MG/1.5ML ~~LOC~~ SOPN: 1.0000 mg | PEN_INJECTOR | SUBCUTANEOUS | 1 refills | Status: DC

## 2020-02-23 NOTE — Telephone Encounter (Signed)
FYI - fax from [facility] states that rx for Rybelsus will cost pt 1400 for 90 days.   I will try to do a PA to reduce the cost.

## 2020-02-24 ENCOUNTER — Other Ambulatory Visit: Payer: Self-pay | Admitting: Internal Medicine

## 2020-02-24 DIAGNOSIS — E118 Type 2 diabetes mellitus with unspecified complications: Secondary | ICD-10-CM

## 2020-02-24 MED ORDER — NOVOFINE 32G X 6 MM MISC: 1.0000 | 0 refills | Status: DC

## 2020-02-25 ENCOUNTER — Other Ambulatory Visit: Payer: Self-pay | Admitting: Internal Medicine

## 2020-02-25 DIAGNOSIS — E118 Type 2 diabetes mellitus with unspecified complications: Secondary | ICD-10-CM

## 2020-02-25 MED ORDER — OZEMPIC (1 MG/DOSE) 4 MG/3ML ~~LOC~~ SOPN: 1.0000 mg | PEN_INJECTOR | SUBCUTANEOUS | 1 refills | Status: DC

## 2020-03-05 ENCOUNTER — Encounter: Payer: Self-pay | Admitting: Internal Medicine

## 2020-03-15 DIAGNOSIS — G4733 Obstructive sleep apnea (adult) (pediatric): Secondary | ICD-10-CM | POA: Diagnosis not present

## 2020-05-02 ENCOUNTER — Other Ambulatory Visit: Payer: Self-pay | Admitting: Internal Medicine

## 2020-05-02 DIAGNOSIS — E118 Type 2 diabetes mellitus with unspecified complications: Secondary | ICD-10-CM

## 2020-05-10 ENCOUNTER — Other Ambulatory Visit: Payer: Self-pay | Admitting: Internal Medicine

## 2020-05-10 DIAGNOSIS — G43011 Migraine without aura, intractable, with status migrainosus: Secondary | ICD-10-CM

## 2020-05-10 DIAGNOSIS — G44019 Episodic cluster headache, not intractable: Secondary | ICD-10-CM

## 2020-05-12 ENCOUNTER — Other Ambulatory Visit: Payer: Self-pay | Admitting: Internal Medicine

## 2020-05-12 DIAGNOSIS — E781 Pure hyperglyceridemia: Secondary | ICD-10-CM

## 2020-05-12 DIAGNOSIS — E118 Type 2 diabetes mellitus with unspecified complications: Secondary | ICD-10-CM

## 2020-05-12 DIAGNOSIS — Z794 Long term (current) use of insulin: Secondary | ICD-10-CM

## 2020-05-12 DIAGNOSIS — E785 Hyperlipidemia, unspecified: Secondary | ICD-10-CM

## 2020-05-16 ENCOUNTER — Ambulatory Visit: Payer: Federal, State, Local not specified - PPO | Admitting: Adult Health

## 2020-05-21 ENCOUNTER — Ambulatory Visit (INDEPENDENT_AMBULATORY_CARE_PROVIDER_SITE_OTHER): Payer: Federal, State, Local not specified - PPO | Admitting: Internal Medicine

## 2020-05-21 ENCOUNTER — Other Ambulatory Visit (INDEPENDENT_AMBULATORY_CARE_PROVIDER_SITE_OTHER): Payer: Federal, State, Local not specified - PPO

## 2020-05-21 ENCOUNTER — Other Ambulatory Visit: Payer: Self-pay

## 2020-05-21 ENCOUNTER — Encounter: Payer: Self-pay | Admitting: Internal Medicine

## 2020-05-21 VITALS — BP 116/84 | HR 89 | Temp 98.1°F | Resp 16 | Ht 66.0 in | Wt 161.0 lb

## 2020-05-21 DIAGNOSIS — E118 Type 2 diabetes mellitus with unspecified complications: Secondary | ICD-10-CM | POA: Diagnosis not present

## 2020-05-21 DIAGNOSIS — E785 Hyperlipidemia, unspecified: Secondary | ICD-10-CM

## 2020-05-21 DIAGNOSIS — K5904 Chronic idiopathic constipation: Secondary | ICD-10-CM

## 2020-05-21 DIAGNOSIS — R972 Elevated prostate specific antigen [PSA]: Secondary | ICD-10-CM | POA: Diagnosis not present

## 2020-05-21 DIAGNOSIS — Z0000 Encounter for general adult medical examination without abnormal findings: Secondary | ICD-10-CM

## 2020-05-21 DIAGNOSIS — Z23 Encounter for immunization: Secondary | ICD-10-CM | POA: Diagnosis not present

## 2020-05-21 LAB — CBC WITH DIFFERENTIAL/PLATELET
Basophils Absolute: 0 10*3/uL (ref 0.0–0.1)
Basophils Relative: 0.4 % (ref 0.0–3.0)
Eosinophils Absolute: 0.2 10*3/uL (ref 0.0–0.7)
Eosinophils Relative: 2.9 % (ref 0.0–5.0)
HCT: 43.3 % (ref 39.0–52.0)
Hemoglobin: 14.8 g/dL (ref 13.0–17.0)
Lymphocytes Relative: 19.8 % (ref 12.0–46.0)
Lymphs Abs: 1.4 10*3/uL (ref 0.7–4.0)
MCHC: 34.2 g/dL (ref 30.0–36.0)
MCV: 88.9 fl (ref 78.0–100.0)
Monocytes Absolute: 0.6 10*3/uL (ref 0.1–1.0)
Monocytes Relative: 8.2 % (ref 3.0–12.0)
Neutro Abs: 5 10*3/uL (ref 1.4–7.7)
Neutrophils Relative %: 68.7 % (ref 43.0–77.0)
Platelets: 200 10*3/uL (ref 150.0–400.0)
RBC: 4.87 Mil/uL (ref 4.22–5.81)
RDW: 13.3 % (ref 11.5–15.5)
WBC: 7.3 10*3/uL (ref 4.0–10.5)

## 2020-05-21 LAB — HEMOGLOBIN A1C: Hgb A1c MFr Bld: 5.9 % (ref 4.6–6.5)

## 2020-05-21 MED ORDER — TRULANCE 3 MG PO TABS: 1.0000 | ORAL_TABLET | Freq: Every day | ORAL | 1 refills | Status: DC

## 2020-05-21 MED ORDER — DAPAGLIFLOZIN PROPANEDIOL 10 MG PO TABS: 10.0000 mg | ORAL_TABLET | Freq: Every day | ORAL | 1 refills | Status: DC

## 2020-05-21 NOTE — Progress Notes (Signed)
Subjective:  Patient ID: Nathan Park, male    DOB: 07/01/1966  Age: 54 y.o. MRN: [id number]  CC: Annual Exam  This visit occurred during the SARS-CoV-2 public health emergency.  Safety protocols were in place, including screening questions prior to the visit, additional usage of staff PPE, and extensive cleaning of exam room while observing appropriate contact time as indicated for disinfecting solutions.    HPI [name] presents for a CPX.  He started using a GLP-1 agonist months ago and since then has lost weight and tells me his blood sugars have been well controlled.  He has decided to stop taking Metformin and the SGLT2 inhibitor.  He is active and denies any recent episodes of chest pain, shortness of breath, palpitations, edema, or fatigue.  He complains of a several month history of severe constipation and straining.  Outpatient Medications Prior to Visit  Medication Sig Dispense Refill  . EMGALITY 120 MG/ML SOAJ INJECT 1 ACT INTO THE SKIN EVERY 30 (THIRTY) DAYS. 3 mL 0  . glucose blood (ONE TOUCH ULTRA TEST) test strip Use as instructed 100 each 12  . Insulin Pen Needle (NOVOFINE) 32G X 6 MM MISC 1 Act by Does not apply route once a week. 50 each 0  . omega-3 acid ethyl esters (LOVAZA) 1 g capsule TAKE 2 CAPSULES (2 G TOTAL) BY MOUTH 2 (TWO) TIMES DAILY. 360 capsule 0  . ONETOUCH DELICA LANCETS 33G MISC Test up to BID. DX: E11.9 200 each 4  . rosuvastatin (CRESTOR) 5 MG tablet TAKE 1 TABLET BY MOUTH EVERY DAY 90 tablet 0  . Semaglutide, 1 MG/DOSE, (OZEMPIC, 1 MG/DOSE,) 4 MG/3ML SOPN Inject 0.75 mLs (1 mg total) into the skin once a week. 9 mL 1  . tadalafil (CIALIS) 20 MG tablet Take 1 tablet (20 mg total) by mouth daily as needed for erectile dysfunction. 10 tablet 5  . FARXIGA 10 MG TABS tablet TAKE 1 TABLET EVERY DAY 90 tablet 1  . metFORMIN (GLUCOPHAGE-XR) 750 MG 24 hr tablet Take 2 tablets (1,500 mg total) by mouth daily with breakfast. 180 tablet 3  . Ubrogepant  (UBRELVY) 100 MG TABS Take 1 tablet by mouth as needed. 8 tablet 5   No facility-administered medications prior to visit.    ROS Review of Systems  Constitutional: Negative for diaphoresis, fatigue and unexpected weight change.  HENT: Negative.   Eyes: Negative.   Respiratory: Negative for cough, chest tightness, shortness of breath and wheezing.   Cardiovascular: Negative for chest pain, palpitations and leg swelling.  Gastrointestinal: Positive for constipation. Negative for abdominal pain, blood in stool, diarrhea, nausea and vomiting.  Endocrine: Negative.  Negative for polydipsia, polyphagia and polyuria.  Genitourinary: Negative.  Negative for difficulty urinating, dysuria, hematuria, scrotal swelling, testicular pain and urgency.  Musculoskeletal: Negative.  Negative for arthralgias and myalgias.  Skin: Negative.  Negative for color change.  Neurological: Negative.  Negative for dizziness, weakness, light-headedness and headaches.  Hematological: Negative for adenopathy. Does not bruise/bleed easily.  Psychiatric/Behavioral: Negative.     Objective:  BP 116/84   Pulse 89   Temp 98.1 F (36.7 C) (Oral)   Resp 16   Ht 5\' 6"  (1.676 m)   Wt 161 lb (73 kg)   SpO2 98%   BMI 25.99 kg/m   BP Readings from Last 3 Encounters:  05/21/20 116/84  11/09/19 112/69  09/27/19 118/76    Wt Readings from Last 3 Encounters:  05/21/20 161 lb (73 kg)  11/09/19 187 lb (84.8 kg)  09/27/19 183 lb 8 oz (83.2 kg)    Physical Exam Vitals reviewed.  HENT:     Nose: Nose normal.     Mouth/Throat:     Mouth: Mucous membranes are moist.  Eyes:     General: No scleral icterus.    Conjunctiva/sclera: Conjunctivae normal.  Cardiovascular:     Rate and Rhythm: Normal rate and regular rhythm.     Heart sounds: No murmur heard.   Pulmonary:     Effort: Pulmonary effort is normal.     Breath sounds: No stridor. No wheezing, rhonchi or rales.  Abdominal:     General: Abdomen is flat.  There is no distension.     Palpations: There is no hepatomegaly, splenomegaly or mass.     Tenderness: There is no abdominal tenderness. There is no guarding.     Hernia: There is no hernia in the left inguinal area or right inguinal area.  Genitourinary:    Pubic Area: No rash.      Penis: Normal and circumcised. No discharge, swelling or lesions.      Testes: Normal.     Epididymis:     Right: Normal.     Left: Normal.     Prostate: Enlarged (1+ smooth symm BPH). Not tender and no nodules present.     Rectum: Normal. Guaiac result negative. No mass, tenderness, anal fissure, external hemorrhoid or internal hemorrhoid. Normal anal tone.  Musculoskeletal:        General: Normal range of motion.     Cervical back: Neck supple.     Right lower leg: No edema.     Left lower leg: No edema.  Lymphadenopathy:     Cervical: No cervical adenopathy.     Lower Body: No right inguinal adenopathy. No left inguinal adenopathy.  Skin:    General: Skin is warm and dry.     Coloration: Skin is not pale.  Neurological:     General: No focal deficit present.     Mental Status: He is alert.  Psychiatric:        Mood and Affect: Mood normal.        Behavior: Behavior normal.     Lab Results  Component Value Date   WBC 7.3 05/21/2020   HGB 14.8 05/21/2020   HCT 43.3 05/21/2020   PLT 200.0 05/21/2020   GLUCOSE 95 05/21/2020   CHOL 126 05/21/2020   TRIG 49.0 05/21/2020   HDL 43.90 05/21/2020   LDLDIRECT 54.0 03/10/2019   LDLCALC 73 05/21/2020   ALT 15 05/21/2020   AST 14 05/21/2020   NA 138 05/21/2020   K 4.6 05/21/2020   CL 103 05/21/2020   CREATININE 1.01 05/21/2020   BUN 16 05/21/2020   CO2 26 05/21/2020   TSH 0.48 05/21/2020   PSA 6.12 (H) 05/21/2020   INR 1.08 03/25/2010   HGBA1C 5.9 05/21/2020   MICROALBUR <0.7 05/21/2020    DG Mandible 4 Views  Result Date: 03/10/2019 CLINICAL DATA:  Right-sided jaw pain for 4-5 days. EXAM: MANDIBLE - 4+ VIEW COMPARISON:  None.  FINDINGS: There is no evidence of fracture or other focal bone lesions. IMPRESSION: Normal mandible. Electronically Signed   By: Francene Boyers M.D.   On: 03/10/2019 16:07    Assessment & Plan:   [name] was seen today for annual exam.  Diagnoses and all orders for this visit:  Type II diabetes mellitus with manifestations (HCC)- His A1c is down to 5.9%.  Marcelline Deist and Metformin has been discontinued.  He will stay on the current dose of the GLP-1 agonist. -     CBC with Differential/Platelet; Future -     Microalbumin / creatinine urine ratio; Future -     Urinalysis, Routine w reflex microscopic; Future -     Hemoglobin A1c; Future -     BASIC METABOLIC PANEL WITH GFR; Future -     Discontinue: dapagliflozin propanediol (FARXIGA) 10 MG TABS tablet; Take 1 tablet (10 mg total) by mouth daily. -     Basic Metabolic Panel (BMET); Future -     HM Diabetes Foot Exam -     Ambulatory referral to Ophthalmology  Routine general medical examination at a health care facility- Exam completed, labs reviewed, vaccines reviewed and updated, cancer screenings are up-to-date, patient education was given. -     Lipid panel; Future -     PSA; Future -     HIV Antibody (routine testing w rflx); Future  Hyperlipidemia LDL goal <70- He has achieved his LDL goal and is doing well on the statin. -     TSH; Future -     Hepatic function panel; Future  Type 2 diabetes mellitus with complication, without long-term current use of insulin (HCC) -     Discontinue: dapagliflozin propanediol (FARXIGA) 10 MG TABS tablet; Take 1 tablet (10 mg total) by mouth daily. -     HM Diabetes Foot Exam -     Ambulatory referral to Ophthalmology  Chronic idiopathic constipation- Review of his meds and labs does not show any secondary causes.  I recommended that he treat this with plecanatide tied. -     Plecanatide (TRULANCE) 3 MG TABS; Take 1 tablet by mouth daily. -     Magnesium; Future  Other orders -     Flu Vaccine  QUAD 6+ mos PF IM (Fluarix Quad PF)   I have discontinued Wylder S. Kats's Shireen Quan, metFORMIN, and dapagliflozin propanediol. I am also having him start on Trulance. Additionally, I am having him maintain his glucose blood, OneTouch Delica Lancets 33G, tadalafil, NovoFine, Ozempic (1 MG/DOSE), Emgality, rosuvastatin, and omega-3 acid ethyl esters.  Meds ordered this encounter  Medications  . DISCONTD: dapagliflozin propanediol (FARXIGA) 10 MG TABS tablet    Sig: Take 1 tablet (10 mg total) by mouth daily.    Dispense:  90 tablet    Refill:  1  . Plecanatide (TRULANCE) 3 MG TABS    Sig: Take 1 tablet by mouth daily.    Dispense:  90 tablet    Refill:  1   In addition to time spent on CPE, I spent 50 minutes in preparing to see the patient by review of recent labs, imaging and procedures, obtaining and reviewing separately obtained history, communicating with the patient and family or caregiver, ordering medications, tests or procedures, and documenting clinical information in the EHR including the differential Dx, treatment, and any further evaluation and other management of 1. Type II diabetes mellitus with manifestations (HCC) 2. Hyperlipidemia LDL goal <70 3. Type 2 diabetes mellitus with complication, without long-term current use of insulin (HCC) 4. Chronic idiopathic constipation 5. PSA elevation     Follow-up: Return in about 6 months (around 11/19/2020).  Sanda Linger, MD

## 2020-05-21 NOTE — Patient Instructions (Signed)

## 2020-05-22 ENCOUNTER — Encounter: Payer: Self-pay | Admitting: Internal Medicine

## 2020-05-22 DIAGNOSIS — R972 Elevated prostate specific antigen [PSA]: Secondary | ICD-10-CM | POA: Insufficient documentation

## 2020-05-22 LAB — URINALYSIS, ROUTINE W REFLEX MICROSCOPIC
Bilirubin Urine: NEGATIVE
Ketones, ur: 15 — AB
Leukocytes,Ua: NEGATIVE
Nitrite: NEGATIVE
Specific Gravity, Urine: 1.025 (ref 1.000–1.030)
Total Protein, Urine: NEGATIVE
Urine Glucose: NEGATIVE
Urobilinogen, UA: 0.2 (ref 0.0–1.0)
pH: 6 (ref 5.0–8.0)

## 2020-05-22 LAB — HEPATIC FUNCTION PANEL
ALT: 15 U/L (ref 0–53)
AST: 14 U/L (ref 0–37)
Albumin: 4.5 g/dL (ref 3.5–5.2)
Alkaline Phosphatase: 39 U/L (ref 39–117)
Bilirubin, Direct: 0.3 mg/dL (ref 0.0–0.3)
Total Bilirubin: 0.7 mg/dL (ref 0.2–1.2)
Total Protein: 7 g/dL (ref 6.0–8.3)

## 2020-05-22 LAB — BASIC METABOLIC PANEL WITH GFR
BUN: 16 mg/dL (ref 7–25)
CO2: 25 mmol/L (ref 20–32)
Calcium: 9.2 mg/dL (ref 8.6–10.3)
Chloride: 102 mmol/L (ref 98–110)
Creat: 0.94 mg/dL (ref 0.70–1.33)
GFR, Est African American: 105 mL/min/{1.73_m2} (ref >=60)
GFR, Est Non African American: 90 mL/min/{1.73_m2} (ref >=60)
Glucose, Bld: 97 mg/dL (ref 65–99)
Potassium: 4.6 mmol/L (ref 3.5–5.3)
Sodium: 139 mmol/L (ref 135–146)

## 2020-05-22 LAB — BASIC METABOLIC PANEL
BUN: 16 mg/dL (ref 6–23)
CO2: 26 mEq/L (ref 19–32)
Calcium: 9 mg/dL (ref 8.4–10.5)
Chloride: 103 mEq/L (ref 96–112)
Creatinine, Ser: 1.01 mg/dL (ref 0.40–1.50)
GFR: 76.33 mL/min (ref >60.00)
Glucose, Bld: 95 mg/dL (ref 70–99)
Potassium: 4.6 mEq/L (ref 3.5–5.1)
Sodium: 138 mEq/L (ref 135–145)

## 2020-05-22 LAB — HIV ANTIBODY (ROUTINE TESTING W REFLEX): HIV 1&2 Ab, 4th Generation: NONREACTIVE

## 2020-05-22 LAB — LIPID PANEL
Cholesterol: 126 mg/dL (ref 0–200)
HDL: 43.9 mg/dL (ref >39.00)
LDL Cholesterol: 73 mg/dL (ref 0–99)
NonHDL: 82.34
Total CHOL/HDL Ratio: 3
Triglycerides: 49 mg/dL (ref 0.0–149.0)
VLDL: 9.8 mg/dL (ref 0.0–40.0)

## 2020-05-22 LAB — PSA: PSA: 6.12 ng/mL — ABNORMAL HIGH (ref 0.10–4.00)

## 2020-05-22 LAB — TSH: TSH: 0.48 u[IU]/mL (ref 0.35–4.50)

## 2020-05-22 LAB — MICROALBUMIN / CREATININE URINE RATIO
Creatinine,U: 88.3 mg/dL
Microalb Creat Ratio: 0.8 mg/g (ref 0.0–30.0)
Microalb, Ur: <0.7 mg/dL (ref 0.0–1.9)

## 2020-05-22 LAB — MAGNESIUM: Magnesium: 2 mg/dL (ref 1.5–2.5)

## 2020-05-24 NOTE — Assessment & Plan Note (Signed)
There has been an increase in his PSA over the last year.  He has no symptoms related to this.  I have asked him return in 2 to 3 months to have this rechecked.

## 2020-06-25 ENCOUNTER — Other Ambulatory Visit: Payer: Self-pay | Admitting: Internal Medicine

## 2020-06-25 DIAGNOSIS — N5201 Erectile dysfunction due to arterial insufficiency: Secondary | ICD-10-CM

## 2020-07-31 ENCOUNTER — Other Ambulatory Visit: Payer: Self-pay | Admitting: Internal Medicine

## 2020-07-31 DIAGNOSIS — E118 Type 2 diabetes mellitus with unspecified complications: Secondary | ICD-10-CM

## 2020-08-02 ENCOUNTER — Encounter: Payer: Self-pay | Admitting: Internal Medicine

## 2020-08-07 ENCOUNTER — Encounter: Payer: Self-pay | Admitting: Internal Medicine

## 2020-08-07 ENCOUNTER — Other Ambulatory Visit: Payer: Self-pay

## 2020-08-07 ENCOUNTER — Ambulatory Visit: Payer: Federal, State, Local not specified - PPO | Admitting: Internal Medicine

## 2020-08-07 VITALS — BP 112/70 | HR 64 | Temp 97.9°F | Ht 66.0 in | Wt 167.0 lb

## 2020-08-07 DIAGNOSIS — R972 Elevated prostate specific antigen [PSA]: Secondary | ICD-10-CM

## 2020-08-07 DIAGNOSIS — N401 Enlarged prostate with lower urinary tract symptoms: Secondary | ICD-10-CM | POA: Diagnosis not present

## 2020-08-07 DIAGNOSIS — N138 Other obstructive and reflux uropathy: Secondary | ICD-10-CM

## 2020-08-07 LAB — PSA: PSA: 4.9

## 2020-08-07 NOTE — Progress Notes (Signed)
Subjective:  Patient ID: Nathan Park, male    DOB: [birth year]  Age: 54 y.o. MRN: [id number]  CC: Follow-up  This visit occurred during the SARS-CoV-2 public health emergency.  Safety protocols were in place, including screening questions prior to the visit, additional usage of staff PPE, and extensive cleaning of exam room while observing appropriate contact time as indicated for disinfecting solutions.    HPI [name] presents for f/up - He returns for follow-up on a mildly elevated PSA.  He has a weak urine stream and straining.  He denies abdominal or pelvic pain.  He denies dysuria or hematuria.  Outpatient Medications Prior to Visit  Medication Sig Dispense Refill  . EMGALITY 120 MG/ML SOAJ INJECT 1 ACT INTO THE SKIN EVERY 30 (THIRTY) DAYS. 3 mL 0  . glucose blood (ONE TOUCH ULTRA TEST) test strip Use as instructed 100 each 12  . omega-3 acid ethyl esters (LOVAZA) 1 g capsule TAKE 2 CAPSULES (2 G TOTAL) BY MOUTH 2 (TWO) TIMES DAILY. 360 capsule 0  . ONETOUCH DELICA LANCETS 33G MISC Test up to BID. DX: E11.9 200 each 4  . OZEMPIC, 1 MG/DOSE, 4 MG/3ML SOPN INJECT 0.75 MLS (1 MG TOTAL) INTO THE SKIN ONCE A WEEK. 9 mL 1  . rosuvastatin (CRESTOR) 5 MG tablet TAKE 1 TABLET BY MOUTH EVERY DAY 90 tablet 0  . tadalafil (CIALIS) 20 MG tablet TAKE 1 TABLET BY MOUTH DAILY AS NEEDED FOR ERECTILE DYSFUNCTION 10 tablet 5  . Insulin Pen Needle (NOVOFINE) 32G X 6 MM MISC 1 Act by Does not apply route once a week. 50 each 0  . Plecanatide (TRULANCE) 3 MG TABS Take 1 tablet by mouth daily. 90 tablet 1   No facility-administered medications prior to visit.    ROS Review of Systems  Constitutional: Negative for diaphoresis and fatigue.  HENT: Negative.   Eyes: Negative.   Respiratory: Negative for cough, chest tightness and shortness of breath.   Cardiovascular: Negative for chest pain, palpitations and leg swelling.  Gastrointestinal: Negative for abdominal pain, constipation, diarrhea,  nausea and vomiting.  Endocrine: Negative.   Genitourinary: Positive for difficulty urinating. Negative for dysuria, flank pain, hematuria and urgency.  Musculoskeletal: Negative for arthralgias and myalgias.  Skin: Negative.   Neurological: Negative.  Negative for dizziness, weakness and light-headedness.  Hematological: Negative for adenopathy. Does not bruise/bleed easily.  Psychiatric/Behavioral: Negative.     Objective:  BP 112/70   Pulse 64   Temp 97.9 F (36.6 C) (Oral)   Ht 5\' 6"  (1.676 m)   Wt 167 lb (75.8 kg)   SpO2 99%   BMI 26.95 kg/m   BP Readings from Last 3 Encounters:  08/07/20 112/70  05/21/20 116/84  11/09/19 112/69    Wt Readings from Last 3 Encounters:  08/07/20 167 lb (75.8 kg)  05/21/20 161 lb (73 kg)  11/09/19 187 lb (84.8 kg)    Physical Exam Vitals reviewed.  Constitutional:      Appearance: Normal appearance.  HENT:     Nose: Nose normal.     Mouth/Throat:     Mouth: Mucous membranes are moist.  Eyes:     General: No scleral icterus.    Conjunctiva/sclera: Conjunctivae normal.  Cardiovascular:     Rate and Rhythm: Normal rate and regular rhythm.     Heart sounds: No murmur heard.   Pulmonary:     Effort: Pulmonary effort is normal.     Breath sounds: No stridor. No wheezing,  rhonchi or rales.  Abdominal:     General: Abdomen is flat.     Palpations: There is no mass.     Tenderness: There is no abdominal tenderness. There is no guarding.  Musculoskeletal:        General: Normal range of motion.     Cervical back: Neck supple.     Right lower leg: No edema.     Left lower leg: No edema.  Lymphadenopathy:     Cervical: No cervical adenopathy.  Skin:    General: Skin is warm and dry.     Coloration: Skin is not pale.  Neurological:     General: No focal deficit present.     Mental Status: He is alert.  Psychiatric:        Mood and Affect: Mood normal.        Behavior: Behavior normal.     Lab Results  Component Value  Date   WBC 7.3 05/21/2020   HGB 14.8 05/21/2020   HCT 43.3 05/21/2020   PLT 200.0 05/21/2020   GLUCOSE 95 05/21/2020   CHOL 126 05/21/2020   TRIG 49.0 05/21/2020   HDL 43.90 05/21/2020   LDLDIRECT 54.0 03/10/2019   LDLCALC 73 05/21/2020   ALT 15 05/21/2020   AST 14 05/21/2020   NA 138 05/21/2020   K 4.6 05/21/2020   CL 103 05/21/2020   CREATININE 1.01 05/21/2020   BUN 16 05/21/2020   CO2 26 05/21/2020   TSH 0.48 05/21/2020   PSA 4.9 08/07/2020   INR 1.08 03/25/2010   HGBA1C 5.9 05/21/2020   MICROALBUR <0.7 05/21/2020    DG Mandible 4 Views  Result Date: 03/10/2019 CLINICAL DATA:  Right-sided jaw pain for 4-5 days. EXAM: MANDIBLE - 4+ VIEW COMPARISON:  None. FINDINGS: There is no evidence of fracture or other focal bone lesions. IMPRESSION: Normal mandible. Electronically Signed   By: Francene Boyers M.D.   On: 03/10/2019 16:07    Assessment & Plan:   [name] was seen today for follow-up.  Diagnoses and all orders for this visit:  PSA elevation- His PSA is down to 4.9.  This is a reassuring sign that he does not have prostate cancer.  He is symptomatic with BPH so I recommended that he start taking a 5 alpha reductase inhibitor. -     PSA, total and free; Future -     PSA, total and free  BPH with obstruction/lower urinary tract symptoms -     dutasteride (AVODART) 0.5 MG capsule; Take 1 capsule (0.5 mg total) by mouth daily.   I have discontinued Aldine S. Bailon's NovoFine and Trulance. I am also having him start on dutasteride. Additionally, I am having him maintain his glucose blood, OneTouch Delica Lancets 33G, Emgality, rosuvastatin, omega-3 acid ethyl esters, tadalafil, and Ozempic (1 MG/DOSE).  Meds ordered this encounter  Medications  . dutasteride (AVODART) 0.5 MG capsule    Sig: Take 1 capsule (0.5 mg total) by mouth daily.    Dispense:  90 capsule    Refill:  1     Follow-up: No follow-ups on file.  Sanda Linger, MD

## 2020-08-08 ENCOUNTER — Encounter: Payer: Self-pay | Admitting: Internal Medicine

## 2020-08-08 DIAGNOSIS — N401 Enlarged prostate with lower urinary tract symptoms: Secondary | ICD-10-CM | POA: Insufficient documentation

## 2020-08-08 DIAGNOSIS — N138 Other obstructive and reflux uropathy: Secondary | ICD-10-CM | POA: Insufficient documentation

## 2020-08-08 LAB — PSA, TOTAL AND FREE
PSA, % Free: 16 % (calc) — ABNORMAL LOW (ref >25)
PSA, Free: 0.8 ng/mL
PSA, Total: 4.9 ng/mL — ABNORMAL HIGH (ref <=4.0)

## 2020-08-08 MED ORDER — DUTASTERIDE 0.5 MG PO CAPS: 0.5000 mg | ORAL_CAPSULE | Freq: Every day | ORAL | 1 refills | Status: DC

## 2020-08-09 ENCOUNTER — Encounter: Payer: Self-pay | Admitting: Internal Medicine

## 2020-08-09 DIAGNOSIS — Z03818 Encounter for observation for suspected exposure to other biological agents ruled out: Secondary | ICD-10-CM | POA: Diagnosis not present

## 2020-08-20 ENCOUNTER — Other Ambulatory Visit: Payer: Self-pay | Admitting: Internal Medicine

## 2020-08-20 DIAGNOSIS — G44019 Episodic cluster headache, not intractable: Secondary | ICD-10-CM

## 2020-08-20 DIAGNOSIS — G43011 Migraine without aura, intractable, with status migrainosus: Secondary | ICD-10-CM

## 2020-08-22 ENCOUNTER — Other Ambulatory Visit: Payer: Self-pay | Admitting: Internal Medicine

## 2020-08-22 DIAGNOSIS — E785 Hyperlipidemia, unspecified: Secondary | ICD-10-CM

## 2020-08-22 DIAGNOSIS — E781 Pure hyperglyceridemia: Secondary | ICD-10-CM

## 2020-08-22 DIAGNOSIS — E118 Type 2 diabetes mellitus with unspecified complications: Secondary | ICD-10-CM

## 2020-08-22 DIAGNOSIS — Z794 Long term (current) use of insulin: Secondary | ICD-10-CM

## 2020-08-22 IMAGING — DX LEFT KNEE - COMPLETE 4+ VIEW
4 series · 4 of 4 positions shown · non-contrast
Comparison: None.

CLINICAL DATA: Medial left knee pain and swelling after doing yard
work 1 week ago.

EXAM:
LEFT KNEE - COMPLETE 4+ VIEW

[knee ap]
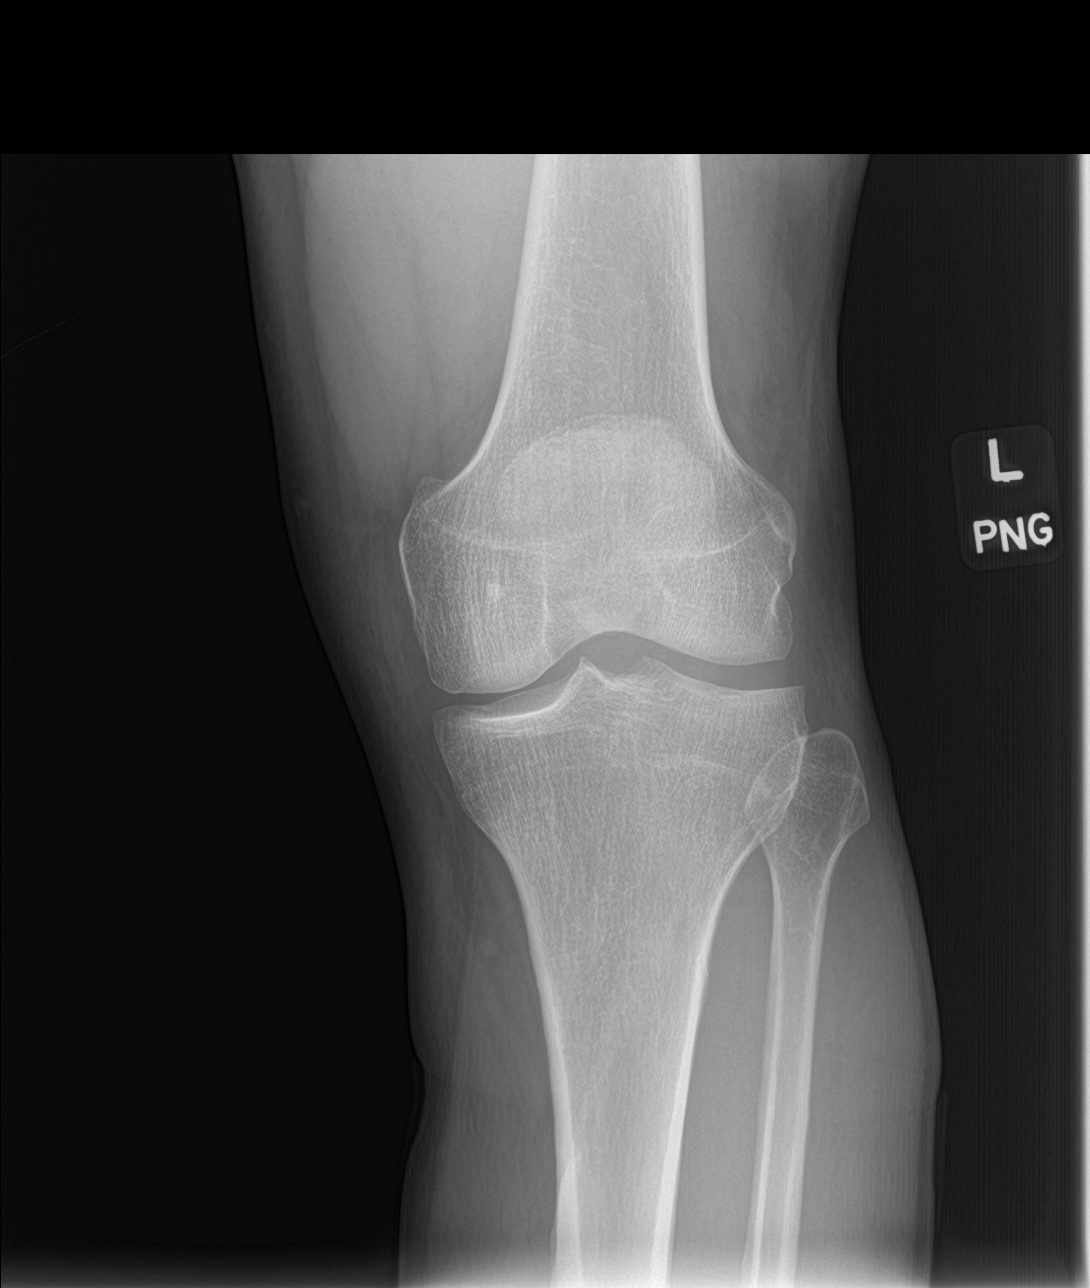

[knee tunnel]
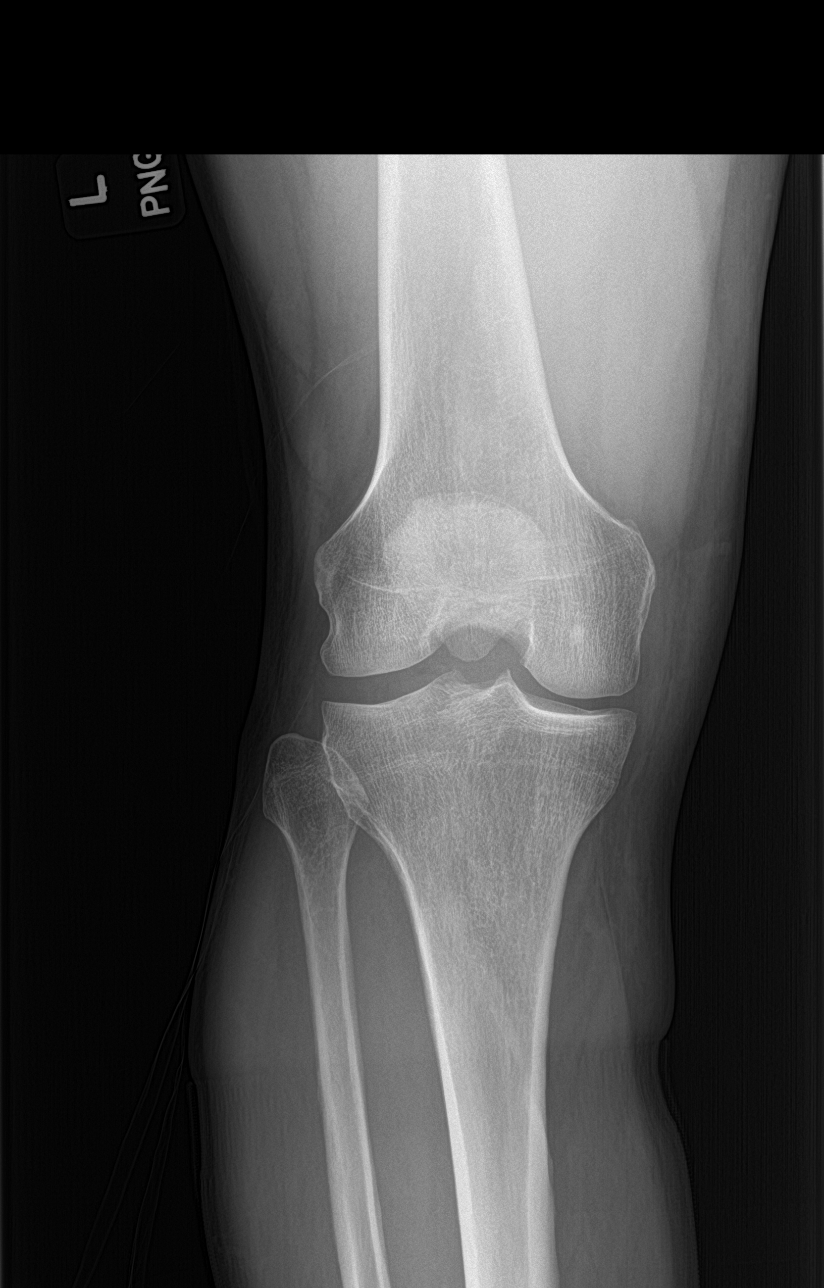

[knee lat]
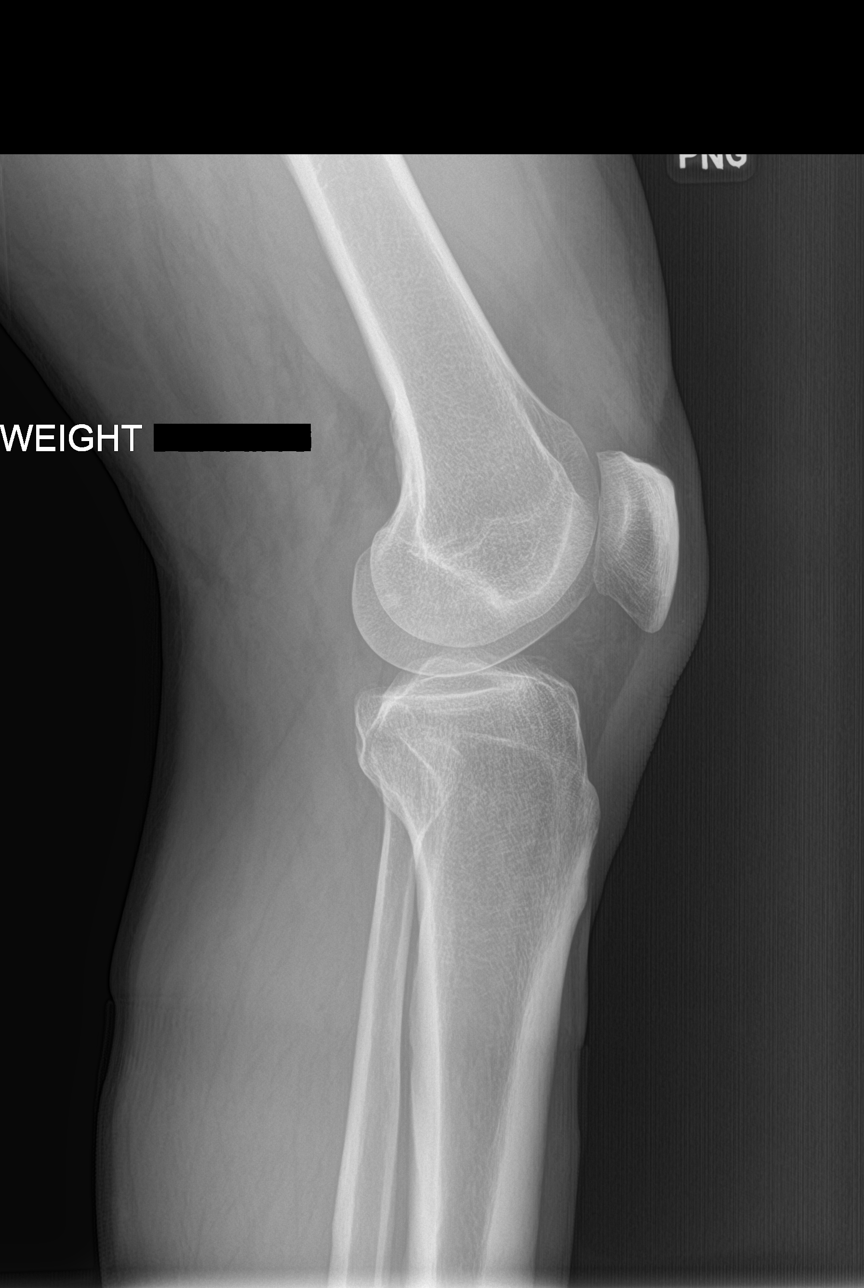

[sunrise]
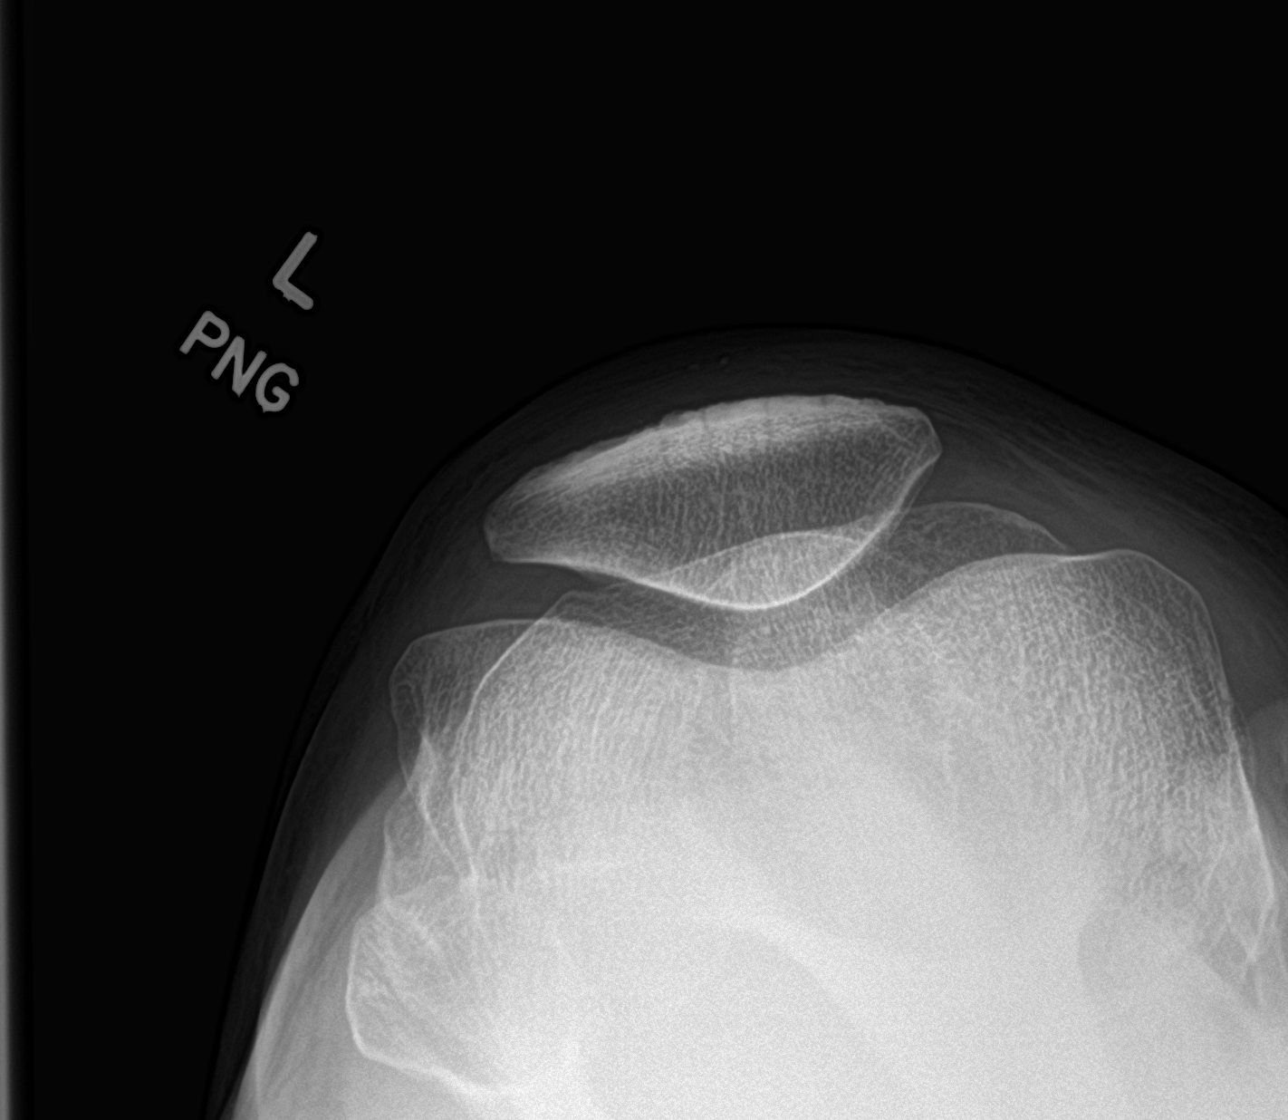

[4 of 4 positions shown; findings below may reference images not displayed]

FINDINGS: No fracture, dislocation, or knee joint effusion is identified.
There is minimal medial compartment joint space narrowing. Lateral
and patellofemoral compartment joint space widths are preserved.
Soft tissues are unremarkable.
IMPRESSION: No evidence of acute osseous abnormality.

## 2020-10-12 ENCOUNTER — Telehealth: Payer: Self-pay

## 2020-10-12 NOTE — Telephone Encounter (Signed)
Key: [id number]

## 2020-10-12 NOTE — Telephone Encounter (Signed)
Approved  09/12/2020 through 10/12/2021

## 2020-11-24 ENCOUNTER — Other Ambulatory Visit: Payer: Self-pay | Admitting: Internal Medicine

## 2020-11-24 DIAGNOSIS — N5201 Erectile dysfunction due to arterial insufficiency: Secondary | ICD-10-CM

## 2020-11-24 IMAGING — DX MANDIBLE - 4+ VIEW
4 series · 4 of 4 positions shown · non-contrast
Comparison: None.

CLINICAL DATA: Right-sided jaw pain for 4-5 days.

EXAM:
MANDIBLE - 4+ VIEW

[mandible pa (1 of 2)]
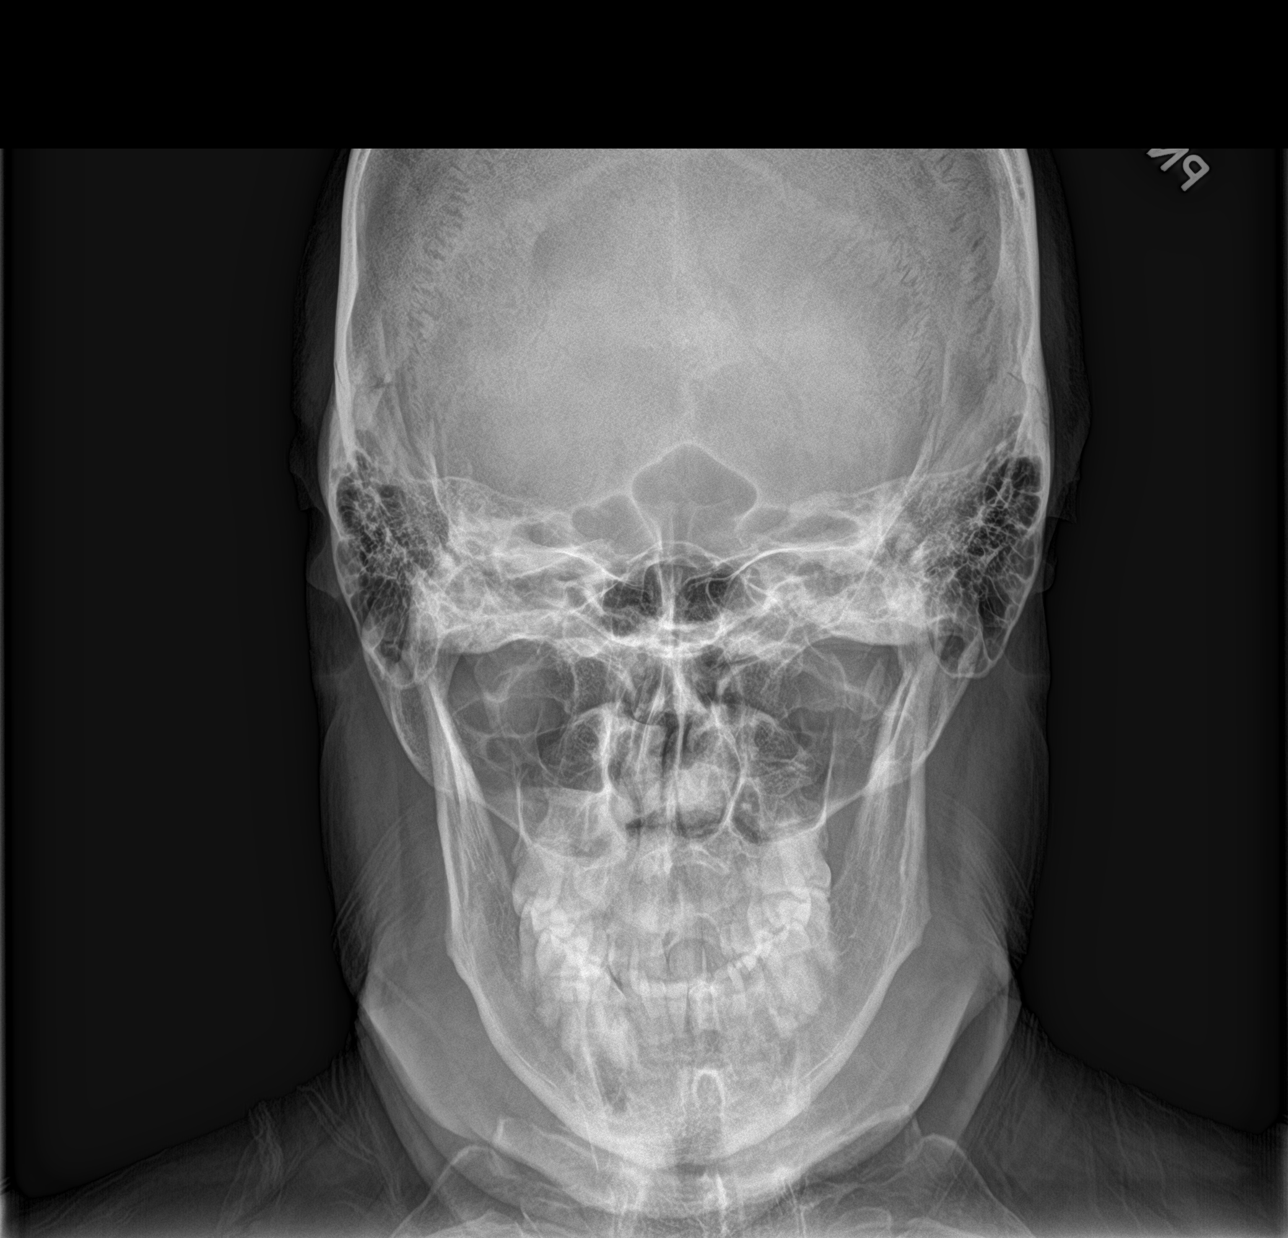

[mandible obl (1 of 2)]
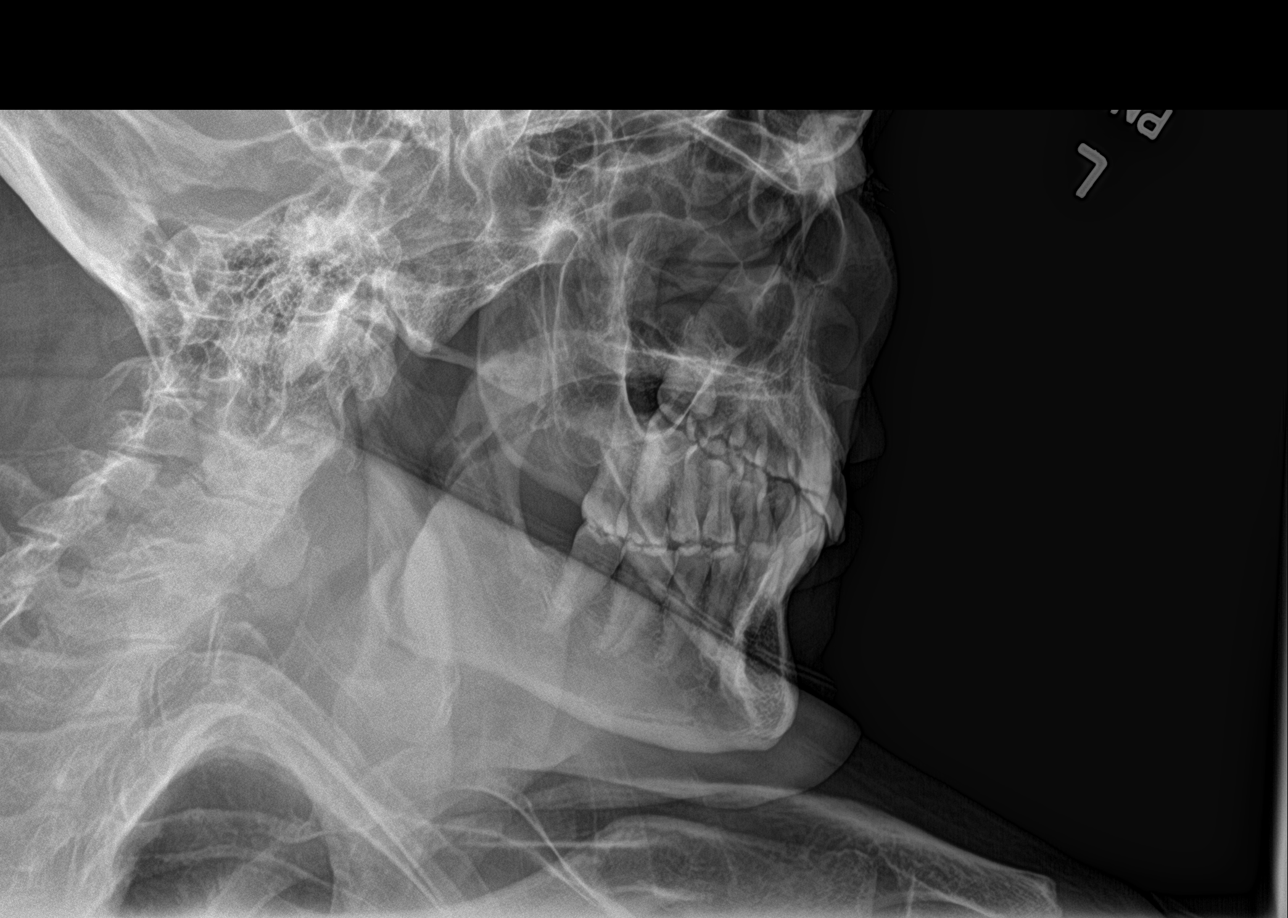

[mandible obl (2 of 2)]
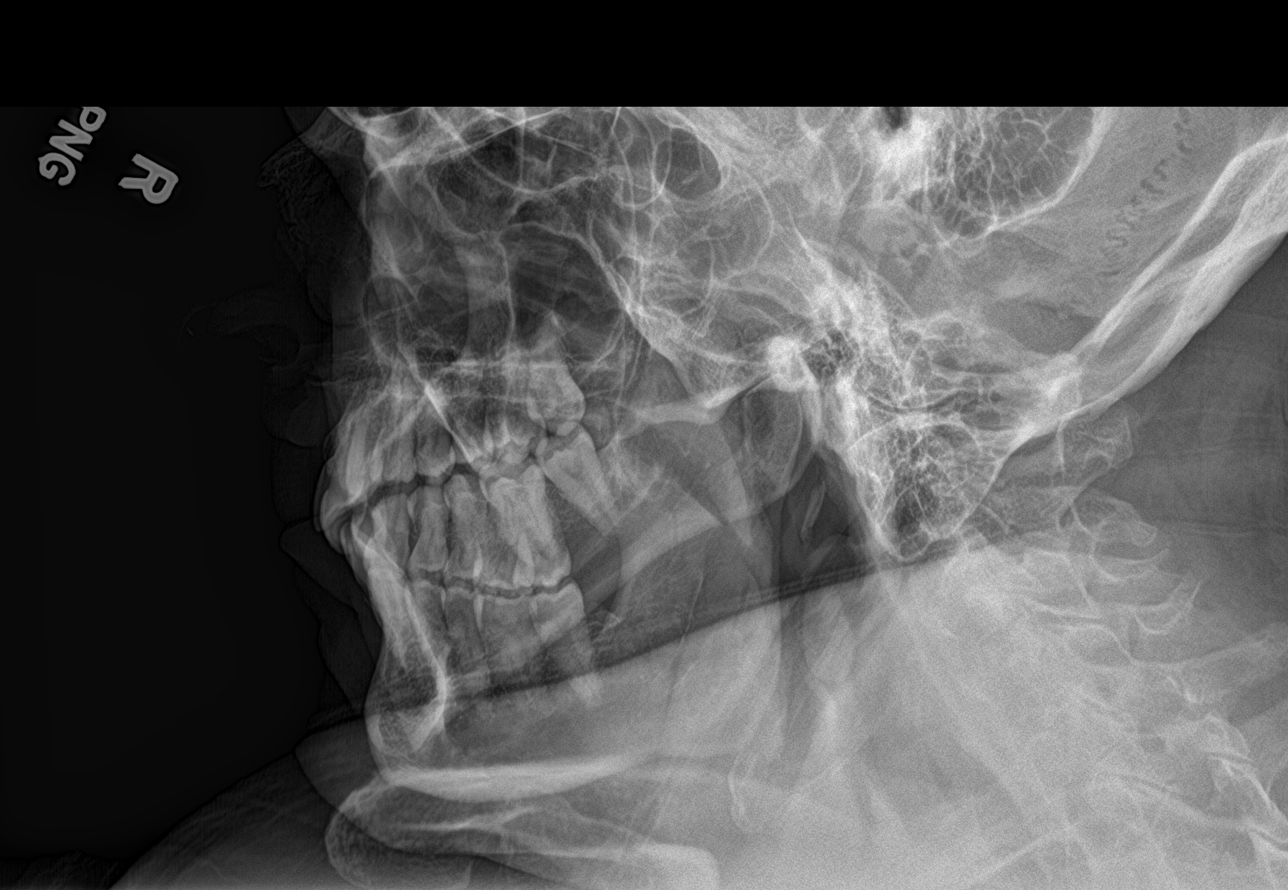

[mandible pa (2 of 2)]
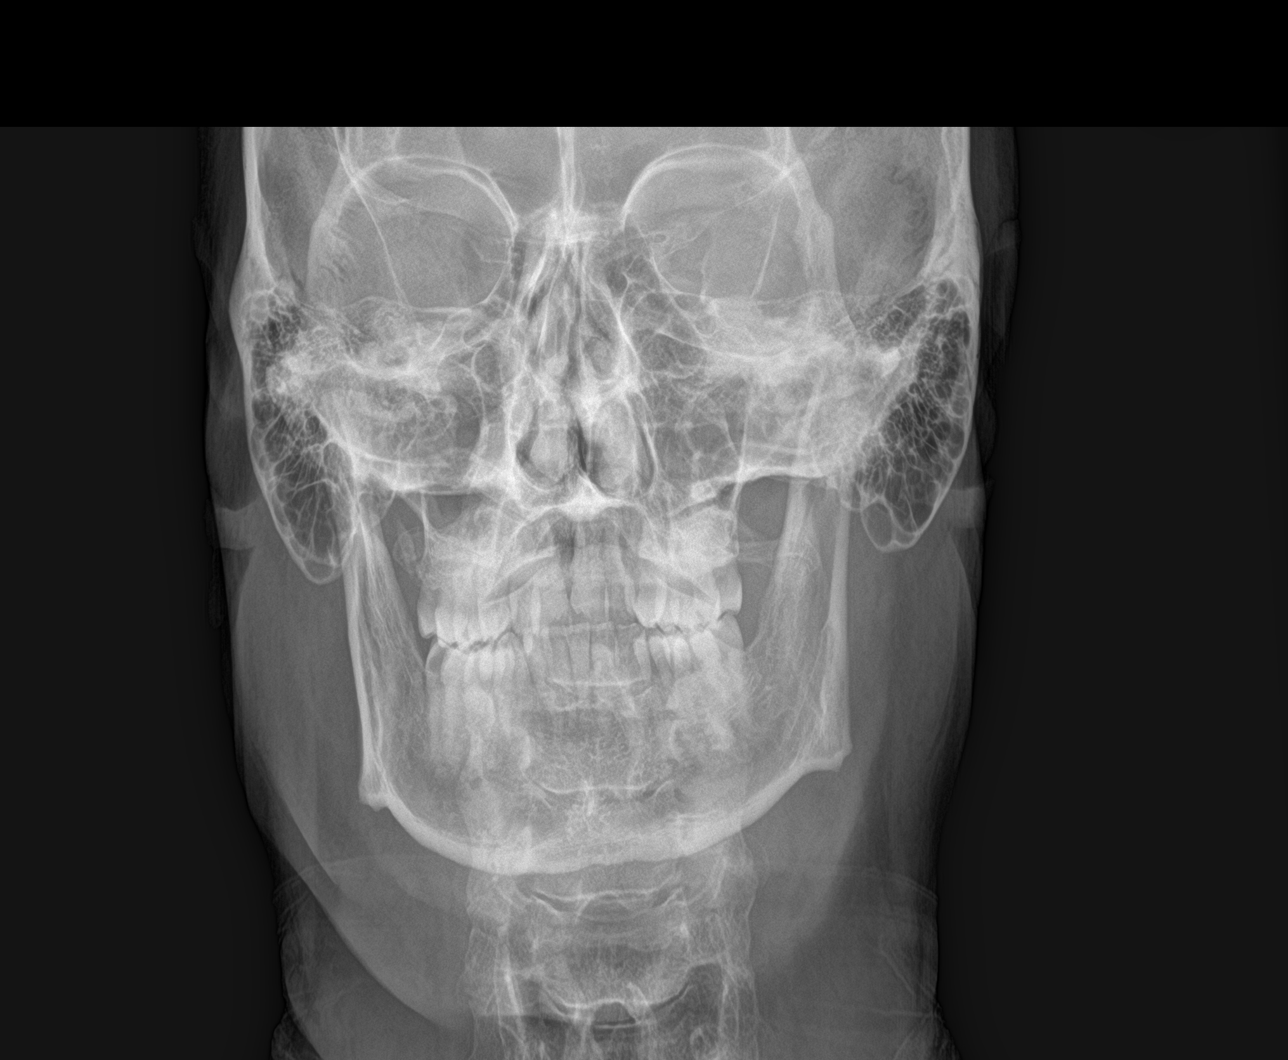

[4 of 4 positions shown; findings below may reference images not displayed]

FINDINGS: There is no evidence of fracture or other focal bone lesions.
IMPRESSION: Normal mandible.

## 2020-12-26 ENCOUNTER — Ambulatory Visit: Payer: Federal, State, Local not specified - PPO | Admitting: Internal Medicine

## 2020-12-26 ENCOUNTER — Other Ambulatory Visit: Payer: Self-pay

## 2020-12-26 ENCOUNTER — Encounter: Payer: Self-pay | Admitting: Internal Medicine

## 2020-12-26 VITALS — BP 114/80 | HR 72 | Temp 98.2°F | Resp 16 | Ht 66.0 in | Wt 170.0 lb

## 2020-12-26 DIAGNOSIS — E785 Hyperlipidemia, unspecified: Secondary | ICD-10-CM | POA: Diagnosis not present

## 2020-12-26 DIAGNOSIS — N401 Enlarged prostate with lower urinary tract symptoms: Secondary | ICD-10-CM

## 2020-12-26 DIAGNOSIS — E118 Type 2 diabetes mellitus with unspecified complications: Secondary | ICD-10-CM

## 2020-12-26 DIAGNOSIS — R972 Elevated prostate specific antigen [PSA]: Secondary | ICD-10-CM

## 2020-12-26 DIAGNOSIS — N138 Other obstructive and reflux uropathy: Secondary | ICD-10-CM | POA: Diagnosis not present

## 2020-12-26 DIAGNOSIS — D72821 Monocytosis (symptomatic): Secondary | ICD-10-CM

## 2020-12-26 LAB — HEPATIC FUNCTION PANEL
ALT: 20 U/L (ref 0–53)
AST: 18 U/L (ref 0–37)
Albumin: 4.5 g/dL (ref 3.5–5.2)
Alkaline Phosphatase: 42 U/L (ref 39–117)
Bilirubin, Direct: 0.1 mg/dL (ref 0.0–0.3)
Total Bilirubin: 0.4 mg/dL (ref 0.2–1.2)
Total Protein: 6.8 g/dL (ref 6.0–8.3)

## 2020-12-26 LAB — BASIC METABOLIC PANEL
BUN: 25 mg/dL — ABNORMAL HIGH (ref 6–23)
CO2: 30 mEq/L (ref 19–32)
Calcium: 8.8 mg/dL (ref 8.4–10.5)
Chloride: 101 mEq/L (ref 96–112)
Creatinine, Ser: 1.07 mg/dL (ref 0.40–1.50)
GFR: 77.36 mL/min (ref >60.00)
Glucose, Bld: 87 mg/dL (ref 70–99)
Potassium: 4.4 mEq/L (ref 3.5–5.1)
Sodium: 138 mEq/L (ref 135–145)

## 2020-12-26 LAB — PSA: PSA: 4.21 ng/mL — ABNORMAL HIGH (ref 0.10–4.00)

## 2020-12-26 LAB — TSH: TSH: 0.75 u[IU]/mL (ref 0.35–4.50)

## 2020-12-26 LAB — HEMOGLOBIN A1C: Hgb A1c MFr Bld: 5.7 % (ref 4.6–6.5)

## 2020-12-26 NOTE — Patient Instructions (Signed)
Type 2 Diabetes Mellitus, Diagnosis, Adult Type 2 diabetes (type 2 diabetes mellitus) is a long-term, or chronic, disease. In type 2 diabetes, one or both of these problems may be present:  The pancreas does not make enough of a hormone called insulin.  Cells in the body do not respond properly to insulin that the body makes (insulin resistance). Normally, insulin allows blood sugar (glucose) to enter cells in the body. The cells use glucose for energy. Insulin resistance or lack of insulin causes excess glucose to build up in the blood instead of going into cells. This causes high blood glucose (hyperglycemia).  What are the causes? The exact cause of type 2 diabetes is not known. What increases the risk? The following factors may make you more likely to develop this condition:  Having a family member with type 2 diabetes.  Being overweight or obese.  Being inactive (sedentary).  Having been diagnosed with insulin resistance.  Having a history of prediabetes, diabetes when you were pregnant (gestational diabetes), or polycystic ovary syndrome (PCOS). What are the signs or symptoms? In the early stage of this condition, you may not have symptoms. Symptoms develop slowly and may include:  Increased thirst or hunger.  Increased urination.  Unexplained weight loss.  Tiredness (fatigue) or weakness.  Vision changes, such as blurry vision.  Dark patches on the skin. How is this diagnosed? This condition is diagnosed based on your symptoms, your medical history, a physical exam, and your blood glucose level. Your blood glucose may be checked with one or more of the following blood tests:  A fasting blood glucose (FBG) test. You will not be allowed to eat (you will fast) for 8 hours or longer before a blood sample is taken.  A random blood glucose test. This test checks blood glucose at any time of day regardless of when you ate.  An A1C (hemoglobin A1C) blood test. This test  provides information about blood glucose levels over the previous 2-3 months.  An oral glucose tolerance test (OGTT). This test measures your blood glucose at two times: ? After fasting. This is your baseline blood glucose level. ? Two hours after drinking a beverage that contains glucose. You may be diagnosed with type 2 diabetes if:  Your fasting blood glucose level is 126 mg/dL (7.0 mmol/L) or higher.  Your random blood glucose level is 200 mg/dL (11.1 mmol/L) or higher.  Your A1C level is 6.5% or higher.  Your oral glucose tolerance test result is higher than 200 mg/dL (11.1 mmol/L). These blood tests may be repeated to confirm your diagnosis.   How is this treated? Your treatment may be managed by a specialist called an endocrinologist. Type 2 diabetes may be treated by following instructions from your health care provider about:  Making dietary and lifestyle changes. These may include: ? Following a personalized nutrition plan that is developed by a registered dietitian. ? Exercising regularly. ? Finding ways to manage stress.  Checking your blood glucose level as often as told.  Taking diabetes medicines or insulin daily. This helps to keep your blood glucose levels in the healthy range.  Taking medicines to help prevent complications from diabetes. Medicines may include: ? Aspirin. ? Medicine to lower cholesterol. ? Medicine to control blood pressure. Your health care provider will set treatment goals for you. Your goals will be based on your age, other medical conditions you have, and how you respond to diabetes treatment. Generally, the goal of treatment is to maintain the   following blood glucose levels:  Before meals: 80-130 mg/dL (4.4-7.2 mmol/L).  After meals: below 180 mg/dL (10 mmol/L).  A1C level: less than 7%. Follow these instructions at home: Questions to ask your health care provider Consider asking the following questions:  Should I meet with a certified  diabetes care and education specialist?  What diabetes medicines do I need, and when should I take them?  What equipment will I need to manage my diabetes at home?  How often do I need to check my blood glucose?  Where can I find a support group for people with diabetes?  What number can I call if I have questions?  When is my next appointment? General instructions  Take over-the-counter and prescription medicines only as told by your health care provider.  Keep all follow-up visits as told by your health care provider. This is important. Where to find more information  American Diabetes Association (ADA): www.diabetes.org  American Association of Diabetes Care and Education Specialists (ADCES): www.diabeteseducator.org  International Diabetes Federation (IDF): www.idf.org Contact a health care provider if:  Your blood glucose is at or above 240 mg/dL (13.3 mmol/L) for 2 days in a row.  You have been sick or have had a fever for 2 days or longer, and you are not getting better.  You have any of the following problems for more than 6 hours: ? You cannot eat or drink. ? You have nausea and vomiting. ? You have diarrhea. Get help right away if:  You have severe hypoglycemia. This means your blood glucose is lower than 54 mg/dL (3.0 mmol/L).  You become confused or you have trouble thinking clearly.  You have difficulty breathing.  You have moderate or large ketone levels in your urine. These symptoms may represent a serious problem that is an emergency. Do not wait to see if the symptoms will go away. Get medical help right away. Call your local emergency services (911 in the [place] Do not drive yourself to the hospital. Summary  Type 2 diabetes (type 2 diabetes mellitus) is a long-term, or chronic, disease. In type 2 diabetes, the pancreas does not make enough of a hormone called insulin, or cells in the body do not respond properly to insulin that the body makes (insulin  resistance).  This condition is treated by making dietary and lifestyle changes and taking diabetes medicines or insulin.  Your health care provider will set treatment goals for you. Your goals will be based on your age, other medical conditions you have, and how you respond to diabetes treatment.  Keep all follow-up visits as told by your health care provider. This is important. This information is not intended to replace advice given to you by your health care provider. Make sure you discuss any questions you have with your health care provider. Document Revised: 02/29/2020 Document Reviewed: 02/29/2020 Elsevier Patient Education  2021 [employer]  

## 2020-12-26 NOTE — Progress Notes (Signed)
Subjective:  Patient ID: Nathan Park, male    DOB: 04-28-1966  Age: 55 y.o. MRN: [id number]  CC: Diabetes  This visit occurred during the SARS-CoV-2 public health emergency.  Safety protocols were in place, including screening questions prior to the visit, additional usage of staff PPE, and extensive cleaning of exam room while observing appropriate contact time as indicated for disinfecting solutions.    HPI Trice S Lieske presents for f/up -  He complains of a several month history of the sensation that he has chills and sweats.  He denies abdominal pain, rash, lymphadenopathy, fever, chills, night sweats, or weight loss.  Outpatient Medications Prior to Visit  Medication Sig Dispense Refill  . dutasteride (AVODART) 0.5 MG capsule Take 1 capsule (0.5 mg total) by mouth daily. 90 capsule 1  . EMGALITY 120 MG/ML SOAJ INJECT 1 ACT INTO THE SKIN EVERY 30 (THIRTY) DAYS. 1 mL 5  . glucose blood (ONE TOUCH ULTRA TEST) test strip Use as instructed 100 each 12  . omega-3 acid ethyl esters (LOVAZA) 1 g capsule TAKE 2 CAPSULES (2 G TOTAL) BY MOUTH 2 (TWO) TIMES DAILY. 360 capsule 1  . ONETOUCH DELICA LANCETS 33G MISC Test up to BID. DX: E11.9 200 each 4  . OZEMPIC, 1 MG/DOSE, 4 MG/3ML SOPN INJECT 0.75 MLS (1 MG TOTAL) INTO THE SKIN ONCE A WEEK. 9 mL 1  . rosuvastatin (CRESTOR) 5 MG tablet TAKE 1 TABLET BY MOUTH EVERY DAY 90 tablet 1  . tadalafil (CIALIS) 20 MG tablet TAKE ONE TABLET BY MOUTH DAILY AS NEEDED FOR ERECTILE DYSFUNCTION 7 tablet 2   No facility-administered medications prior to visit.    ROS Review of Systems  Constitutional: Negative.  Negative for diaphoresis, fatigue and unexpected weight change.  HENT: Negative.   Respiratory: Negative.  Negative for cough, shortness of breath and wheezing.   Cardiovascular: Negative for chest pain, palpitations and leg swelling.  Gastrointestinal: Negative for abdominal pain, diarrhea, nausea and vomiting.  Endocrine: Positive for cold  intolerance. Negative for heat intolerance, polydipsia, polyphagia and polyuria.  Genitourinary: Negative.  Negative for difficulty urinating.  Musculoskeletal: Negative.  Negative for myalgias.  Skin: Negative for color change and rash.  Neurological: Negative.  Negative for dizziness and weakness.  Hematological: Negative for adenopathy. Does not bruise/bleed easily.  Psychiatric/Behavioral: Negative.     Objective:  BP 114/80 (BP Location: Left Arm, Patient Position: Sitting, Cuff Size: Large)   Pulse 72   Temp 98.2 F (36.8 C) (Oral)   Resp 16   Ht 5\' 6"  (1.676 m)   Wt 170 lb (77.1 kg)   SpO2 97%   BMI 27.44 kg/m   BP Readings from Last 3 Encounters:  12/26/20 114/80  08/07/20 112/70  05/21/20 116/84    Wt Readings from Last 3 Encounters:  12/26/20 170 lb (77.1 kg)  08/07/20 167 lb (75.8 kg)  05/21/20 161 lb (73 kg)    Physical Exam Vitals reviewed.  HENT:     Nose: Nose normal.     Mouth/Throat:     Mouth: Mucous membranes are moist.  Eyes:     General: No scleral icterus.    Conjunctiva/sclera: Conjunctivae normal.  Cardiovascular:     Rate and Rhythm: Normal rate and regular rhythm.     Heart sounds: No murmur heard.   Pulmonary:     Effort: Pulmonary effort is normal.     Breath sounds: No stridor. No wheezing, rhonchi or rales.  Chest:  Breasts:  Right: No axillary adenopathy or supraclavicular adenopathy.     Left: No axillary adenopathy or supraclavicular adenopathy.    Abdominal:     General: Abdomen is flat. Bowel sounds are normal. There is no distension.     Palpations: Abdomen is soft. There is no hepatomegaly, splenomegaly or mass.     Tenderness: There is no abdominal tenderness.  Musculoskeletal:        General: Normal range of motion.     Cervical back: Neck supple.     Right lower leg: No edema.     Left lower leg: No edema.  Lymphadenopathy:     Cervical: No cervical adenopathy.     Upper Body:     Right upper body: No  supraclavicular or axillary adenopathy.     Left upper body: No supraclavicular or axillary adenopathy.     Lower Body: No right inguinal adenopathy. No left inguinal adenopathy.  Skin:    General: Skin is warm and dry.     Coloration: Skin is not pale.     Findings: No rash.  Neurological:     General: No focal deficit present.     Mental Status: He is alert.     Lab Results  Component Value Date   WBC 3.1 (L) 12/26/2020   HGB 15.9 12/26/2020   HCT 45.6 12/26/2020   PLT 144.0 (L) 12/26/2020   GLUCOSE 87 12/26/2020   CHOL 126 05/21/2020   TRIG 49.0 05/21/2020   HDL 43.90 05/21/2020   LDLDIRECT 54.0 03/10/2019   LDLCALC 73 05/21/2020   ALT 20 12/26/2020   AST 18 12/26/2020   NA 138 12/26/2020   K 4.4 12/26/2020   CL 101 12/26/2020   CREATININE 1.07 12/26/2020   BUN 25 (H) 12/26/2020   CO2 30 12/26/2020   TSH 0.75 12/26/2020   PSA 4.21 (H) 12/26/2020   INR 1.08 03/25/2010   HGBA1C 5.7 12/26/2020   MICROALBUR <0.7 05/21/2020    DG Mandible 4 Views  Result Date: 03/10/2019 CLINICAL DATA:  Right-sided jaw pain for 4-5 days. EXAM: MANDIBLE - 4+ VIEW COMPARISON:  None. FINDINGS: There is no evidence of fracture or other focal bone lesions. IMPRESSION: Normal mandible. Electronically Signed   By: Francene Boyers M.D.   On: 03/10/2019 16:07    Assessment & Plan:   Lindsay was seen today for diabetes.  Diagnoses and all orders for this visit:  Type 2 diabetes mellitus with complication, without long-term current use of insulin (HCC)- His blood sugar is adequately well controlled. -     CBC with Differential/Platelet; Future -     Basic metabolic panel; Future -     Hemoglobin A1c; Future -     Hepatic function panel; Future -     Hepatic function panel -     Hemoglobin A1c -     Basic metabolic panel -     CBC with Differential/Platelet  BPH with obstruction/lower urinary tract symptoms -     CBC with Differential/Platelet; Future -     PSA; Future -     PSA -      CBC with Differential/Platelet  PSA elevation- His PSA is not rising.  This is a reassuring sign that he does not have prostate cancer. -     CBC with Differential/Platelet; Future -     PSA; Future -     PSA -     CBC with Differential/Platelet  Hyperlipidemia LDL goal <70 -     CBC  with Differential/Platelet; Future -     TSH; Future -     Hepatic function panel; Future -     Hepatic function panel -     TSH -     CBC with Differential/Platelet  Monocytosis- He has a sensation of chills and hass developed a low white count, low platelet count, and monocytosis. I have asked him to see hematology to be screened for lymphoproliferative disease. -     Ambulatory referral to Hematology / Oncology   I am having Marvin S. Swiger maintain his glucose blood, OneTouch Delica Lancets 33G, Ozempic (1 MG/DOSE), dutasteride, Emgality, omega-3 acid ethyl esters, rosuvastatin, and tadalafil.  No orders of the defined types were placed in this encounter.    Follow-up: Return in about 6 months (around 06/28/2021).  Sanda Linger, MD

## 2020-12-27 ENCOUNTER — Telehealth: Payer: Self-pay | Admitting: Hematology and Oncology

## 2020-12-27 DIAGNOSIS — D72821 Monocytosis (symptomatic): Secondary | ICD-10-CM | POA: Insufficient documentation

## 2020-12-27 LAB — CBC WITH DIFFERENTIAL/PLATELET
Basophils Absolute: 0 10*3/uL (ref 0.0–0.1)
Basophils Relative: 0.5 % (ref 0.0–3.0)
Eosinophils Absolute: 0.1 10*3/uL (ref 0.0–0.7)
Eosinophils Relative: 2 % (ref 0.0–5.0)
HCT: 45.6 % (ref 39.0–52.0)
Hemoglobin: 15.9 g/dL (ref 13.0–17.0)
Lymphocytes Relative: 38.5 % (ref 12.0–46.0)
Lymphs Abs: 1.2 10*3/uL (ref 0.7–4.0)
MCHC: 34.8 g/dL (ref 30.0–36.0)
MCV: 89.5 fl (ref 78.0–100.0)
Monocytes Absolute: 0.8 10*3/uL (ref 0.1–1.0)
Monocytes Relative: 25.3 % — ABNORMAL HIGH (ref 3.0–12.0)
Neutro Abs: 1.1 10*3/uL — ABNORMAL LOW (ref 1.4–7.7)
Neutrophils Relative %: 33.7 % — ABNORMAL LOW (ref 43.0–77.0)
Platelets: 144 10*3/uL — ABNORMAL LOW (ref 150.0–400.0)
RBC: 5.1 Mil/uL (ref 4.22–5.81)
RDW: 13.1 % (ref 11.5–15.5)
WBC: 3.1 10*3/uL — ABNORMAL LOW (ref 4.0–10.5)

## 2020-12-27 NOTE — Telephone Encounter (Signed)
Received a new hem referral from Dr. Yetta Barre for monocytosis. Pt has been cld and scheduled to see Dr. Leonides Schanz on 5/19 at 2pm. Pt aware to to arrive 20 minuets early.

## 2021-01-03 ENCOUNTER — Other Ambulatory Visit: Payer: Self-pay

## 2021-01-03 ENCOUNTER — Inpatient Hospital Stay
Payer: Federal, State, Local not specified - PPO | Attending: Hematology and Oncology | Admitting: Hematology and Oncology

## 2021-01-03 ENCOUNTER — Inpatient Hospital Stay: Payer: Federal, State, Local not specified - PPO

## 2021-01-03 VITALS — BP 104/81 | HR 80 | Temp 98.4°F | Resp 18 | Ht 66.0 in | Wt 167.5 lb

## 2021-01-03 DIAGNOSIS — Z789 Other specified health status: Secondary | ICD-10-CM

## 2021-01-03 DIAGNOSIS — Z7289 Other problems related to lifestyle: Secondary | ICD-10-CM | POA: Diagnosis not present

## 2021-01-03 DIAGNOSIS — D72821 Monocytosis (symptomatic): Secondary | ICD-10-CM | POA: Diagnosis not present

## 2021-01-03 DIAGNOSIS — Z8719 Personal history of other diseases of the digestive system: Secondary | ICD-10-CM | POA: Diagnosis not present

## 2021-01-03 DIAGNOSIS — D709 Neutropenia, unspecified: Secondary | ICD-10-CM

## 2021-01-03 DIAGNOSIS — Z79899 Other long term (current) drug therapy: Secondary | ICD-10-CM | POA: Insufficient documentation

## 2021-01-03 DIAGNOSIS — IMO0001 Reserved for inherently not codable concepts without codable children: Secondary | ICD-10-CM

## 2021-01-03 DIAGNOSIS — E119 Type 2 diabetes mellitus without complications: Secondary | ICD-10-CM | POA: Diagnosis not present

## 2021-01-03 DIAGNOSIS — D708 Other neutropenia: Secondary | ICD-10-CM

## 2021-01-03 DIAGNOSIS — K219 Gastro-esophageal reflux disease without esophagitis: Secondary | ICD-10-CM | POA: Insufficient documentation

## 2021-01-03 DIAGNOSIS — D72819 Decreased white blood cell count, unspecified: Secondary | ICD-10-CM | POA: Diagnosis not present

## 2021-01-03 DIAGNOSIS — K2270 Barrett's esophagus without dysplasia: Secondary | ICD-10-CM | POA: Insufficient documentation

## 2021-01-03 LAB — CMP (CANCER CENTER ONLY)
ALT: 45 U/L — ABNORMAL HIGH (ref 0–44)
AST: 25 U/L (ref 15–41)
Albumin: 4.2 g/dL (ref 3.5–5.0)
Alkaline Phosphatase: 46 U/L (ref 38–126)
Anion gap: 9 (ref 5–15)
BUN: 19 mg/dL (ref 6–20)
CO2: 30 mmol/L (ref 22–32)
Calcium: 8.9 mg/dL (ref 8.9–10.3)
Chloride: 102 mmol/L (ref 98–111)
Creatinine: 0.87 mg/dL (ref 0.61–1.24)
GFR, Estimated: >60 mL/min (ref >60)
Glucose, Bld: 81 mg/dL (ref 70–99)
Potassium: 4 mmol/L (ref 3.5–5.1)
Sodium: 141 mmol/L (ref 135–145)
Total Bilirubin: 0.7 mg/dL (ref 0.3–1.2)
Total Protein: 6.9 g/dL (ref 6.5–8.1)

## 2021-01-03 LAB — CBC WITH DIFFERENTIAL (CANCER CENTER ONLY)
Abs Immature Granulocytes: 0.02 10*3/uL (ref 0.00–0.07)
Basophils Absolute: 0 10*3/uL (ref 0.0–0.1)
Basophils Relative: 0 %
Eosinophils Absolute: 0.2 10*3/uL (ref 0.0–0.5)
Eosinophils Relative: 3 %
HCT: 44.1 % (ref 39.0–52.0)
Hemoglobin: 15.2 g/dL (ref 13.0–17.0)
Immature Granulocytes: 0 %
Lymphocytes Relative: 30 %
Lymphs Abs: 1.3 10*3/uL (ref 0.7–4.0)
MCH: 30.4 pg (ref 26.0–34.0)
MCHC: 34.5 g/dL (ref 30.0–36.0)
MCV: 88.2 fL (ref 80.0–100.0)
Monocytes Absolute: 0.5 10*3/uL (ref 0.1–1.0)
Monocytes Relative: 10 %
Neutro Abs: 2.5 10*3/uL (ref 1.7–7.7)
Neutrophils Relative %: 57 %
Platelet Count: 146 10*3/uL — ABNORMAL LOW (ref 150–400)
RBC: 5 MIL/uL (ref 4.22–5.81)
RDW: 12 % (ref 11.5–15.5)
WBC Count: 4.5 10*3/uL (ref 4.0–10.5)
nRBC: 0 % (ref 0.0–0.2)

## 2021-01-03 LAB — VITAMIN B12: Vitamin B-12: 333 pg/mL (ref 180–914)

## 2021-01-03 LAB — LACTATE DEHYDROGENASE: LDH: 144 U/L (ref 98–192)

## 2021-01-03 LAB — HEPATITIS C ANTIBODY: HCV Ab: NONREACTIVE

## 2021-01-03 LAB — HEPATITIS B SURFACE ANTIGEN: Hepatitis B Surface Ag: NONREACTIVE

## 2021-01-03 LAB — FOLATE: Folate: 15.4 ng/mL (ref >5.9)

## 2021-01-03 LAB — HEPATITIS B CORE ANTIBODY, TOTAL: Hep B Core Total Ab: NONREACTIVE

## 2021-01-03 NOTE — Progress Notes (Signed)
[facility] Telephone:([phone]   [phone]  INITIAL CONSULT NOTE  Patient Care Team: Etta Grandchild, MD as PCP - General (Internal Medicine)  Hematological/Oncological History # Relative Monocytosis #Mild Neutropenia 12/26/2020: WBC 3.1, Hgb 15.9, MCV 89.5, Plt 144. Monocyte relative 25.3%, Absolute monocyte count 0.8.  01/03/2021: establish care with Dr. Leonides Schanz   CHIEF COMPLAINTS/PURPOSE OF CONSULTATION:  "Relative Monocytosis "  HISTORY OF PRESENTING ILLNESS:  Nathan Park 55 y.o. male with medical history significant for GERD, DM type II, hemorrhoids, Barrett esophagus, and diverticulosis who presents for an evaluation of a relative monocytosis.  On review of the previous records Mr. Yom was noted to have a normal CBC on 05/21/2020.  At that time he had a white blood cell count 7.3, hemoglobin 14.8, platelets of 200.  His ANC was 5000 and his monocyte count was 600.  More recently on 12/26/2020 patient was found to have a white cell count 3.1, hemoglobin 15.9, platelet count of 144 with neutrophil count of 1100 and a monocyte count of 800.  This caused an increase in the relative count of the monocytes up to 25.3% (from 8.2% of prior visit).  Due to concern for this abnormality within the CBC the patient was referred to hematology for further evaluation management.  On exam today Mr. Raineri reports that he had unusual set of symptoms leading up to his last CBC.  He reports that the symptoms started on Sunday when he was "unable to get warm".  He reports that he would sit in his car and cranky temperature up to as high as it would go and but still not feels warm.  This is despite the fact that temperatures this week in [place] have been in the 80 degrees.  He reports that when this persisted and he was increasingly tender to the point where was it making his wife uncomfortable he called his primary care provider to get his labs checked.  Interestingly  on exam today the patient reports that he "feels fine".  And then he is gone back to normal.  He reports that he started normalizing around Thursday of last week.  He was having some muscle aches and pains and he believes this was because he was shivering so hard.  He has a Advil and a warm bath helps resolve the symptoms.  Reports that he is typically prone to headaches but did not have any headaches during the course of this illness.  He reports that his family history is not remarkable for any blood diseases or blood disorders.  He also notes he is not on any special diets right now but is drinking a nutritional supplement known as Ka'chava.  He did have some diarrhea earlier this week which suddenly tapered off.  He is a never smoker but does drink approximately 1 to 2 glasses of wine per night.  He currently works in Programmer, multimedia side of Producer, television/film/video.  As of now he denies any fevers, chills, sweats, nausea, vomiting or diarrhea.  A full 10 point ROS is listed below.  MEDICAL HISTORY:  Past Medical History:  Diagnosis Date  . Barrett esophagus 01/31/10   at 32 cm  . Complication of anesthesia    slow to wake up  . Diabetes mellitus   . Diverticulosis   . GERD (gastroesophageal reflux disease)   . Headache(784.0)   . Hemorrhoids   . Hiatal hernia   . Hiatal hernia   . Nasal congestion   .  Rectal pain   . Refusal of blood transfusions as patient is Jehovah's Witness   . Umbilical hernia     SURGICAL HISTORY: Past Surgical History:  Procedure Laterality Date  . CARPAL TUNNEL RELEASE    . LAPAROSCOPIC NISSEN FUNDOPLICATION  08/25/2012   Procedure: LAPAROSCOPIC NISSEN FUNDOPLICATION;  Surgeon: Valarie Merino, MD;  Location: [facility];  Service: General;  Laterality: N/A;  . ORIF DISTAL RADIUS FRACTURE  2009  . repair umblical hernia repair  04/01/10  . sigmoid colectomy  04/01/10  . take down of colovesical fistula  04/01/10  . takedown of enterovesical fistula  04/01/10  . WRIST  SURGERY     left    SOCIAL HISTORY: Social History   Socioeconomic History  . Marital status: Married    Spouse name: Not on file  . Number of children: 4  . Years of education: Not on file  . Highest education level: Not on file  Occupational History    Employer: [employer]  Tobacco Use  . Smoking status: Never Smoker  . Smokeless tobacco: Never Used  Substance and Sexual Activity  . Alcohol use: Yes    Alcohol/week: 5.0 standard drinks    Types: 3 Glasses of wine, 2 Shots of liquor per week    Comment: 2 drinks a week   . Drug use: No  . Sexual activity: Yes  Other Topics Concern  . Not on file  Social History Narrative   No caffeine drinks    Social Determinants of Health   Financial Resource Strain: Not on file  Food Insecurity: Not on file  Transportation Needs: Not on file  Physical Activity: Not on file  Stress: Not on file  Social Connections: Not on file  Intimate Partner Violence: Not on file    FAMILY HISTORY: Family History  Problem Relation Age of Onset  . Diabetes Father   . Colon cancer Neg Hx   . Cancer Neg Hx   . Heart disease Neg Hx   . Hypertension Neg Hx   . Kidney disease Neg Hx     ALLERGIES:  is allergic to morphine and related and oxycodone hcl.  MEDICATIONS:  Current Outpatient Medications  Medication Sig Dispense Refill  . dutasteride (AVODART) 0.5 MG capsule Take 1 capsule (0.5 mg total) by mouth daily. 90 capsule 1  . EMGALITY 120 MG/ML SOAJ INJECT 1 ACT INTO THE SKIN EVERY 30 (THIRTY) DAYS. 1 mL 5  . glucose blood (ONE TOUCH ULTRA TEST) test strip Use as instructed 100 each 12  . omega-3 acid ethyl esters (LOVAZA) 1 g capsule TAKE 2 CAPSULES (2 G TOTAL) BY MOUTH 2 (TWO) TIMES DAILY. 360 capsule 1  . ONETOUCH DELICA LANCETS 33G MISC Test up to BID. DX: E11.9 200 each 4  . OZEMPIC, 1 MG/DOSE, 4 MG/3ML SOPN INJECT 0.75 MLS (1 MG TOTAL) INTO THE SKIN ONCE A WEEK. 9 mL 1  . rosuvastatin (CRESTOR) 5 MG tablet TAKE 1 TABLET BY  MOUTH EVERY DAY 90 tablet 1  . tadalafil (CIALIS) 20 MG tablet TAKE ONE TABLET BY MOUTH DAILY AS NEEDED FOR ERECTILE DYSFUNCTION 7 tablet 2   No current facility-administered medications for this visit.    REVIEW OF SYSTEMS:   Constitutional: ( - ) fevers, ( - )  chills , ( - ) night sweats Eyes: ( - ) blurriness of vision, ( - ) double vision, ( - ) watery eyes Ears, nose, mouth, throat, and face: ( - ) mucositis, ( - )  sore throat Respiratory: ( - ) cough, ( - ) dyspnea, ( - ) wheezes Cardiovascular: ( - ) palpitation, ( - ) chest discomfort, ( - ) lower extremity swelling Gastrointestinal:  ( - ) nausea, ( - ) heartburn, ( - ) change in bowel habits Skin: ( - ) abnormal skin rashes Lymphatics: ( - ) new lymphadenopathy, ( - ) easy bruising Neurological: ( - ) numbness, ( - ) tingling, ( - ) new weaknesses Behavioral/Psych: ( - ) mood change, ( - ) new changes  All other systems were reviewed with the patient and are negative.  PHYSICAL EXAMINATION:  Vitals:   01/03/21 1421  BP: 104/81  Pulse: 80  Resp: 18  Temp: 98.4 F (36.9 C)  SpO2: 98%   Filed Weights   01/03/21 1421  Weight: 167 lb 8 oz (76 kg)    GENERAL: well appearing middle aged Caucasian male in NAD  SKIN: skin color, texture, turgor are normal, no rashes or significant lesions EYES: conjunctiva are pink and non-injected, sclera clear LUNGS: clear to auscultation and percussion with normal breathing effort HEART: regular rate & rhythm and no murmurs and no lower extremity edema Musculoskeletal: no cyanosis of digits and no clubbing  PSYCH: alert & oriented x 3, fluent speech NEURO: no focal motor/sensory deficits  LABORATORY DATA:  I have reviewed the data as listed CBC Latest Ref Rng & Units 01/03/2021 12/26/2020 05/21/2020  WBC 4.0 - 10.5 K/uL 4.5 3.1(L) 7.3  Hemoglobin 13.0 - 17.0 g/dL 16.115.2 09.615.9 04.514.8  Hematocrit 39.0 - 52.0 % 44.1 45.6 43.3  Platelets 150 - 400 K/uL 146(L) 144.0(L) 200.0    CMP Latest  Ref Rng & Units 01/03/2021 12/26/2020 05/21/2020  Glucose 70 - 99 mg/dL 81 87 95  BUN 6 - 20 mg/dL 19 40(J25(H) 16  Creatinine 0.61 - 1.24 mg/dL 8.110.87 9.141.07 7.821.01  Sodium 135 - 145 mmol/L 141 138 138  Potassium 3.5 - 5.1 mmol/L 4.0 4.4 4.6  Chloride 98 - 111 mmol/L 102 101 103  CO2 22 - 32 mmol/L 30 30 26   Calcium 8.9 - 10.3 mg/dL 8.9 8.8 9.0  Total Protein 6.5 - 8.1 g/dL 6.9 6.8 -  Total Bilirubin 0.3 - 1.2 mg/dL 0.7 0.4 -  Alkaline Phos 38 - 126 U/L 46 42 -  AST 15 - 41 U/L 25 18 -  ALT 0 - 44 U/L 45(H) 20 -    RADIOGRAPHIC STUDIES: No results found.  ASSESSMENT & PLAN Suhan S Fairhurst 55 y.o. male with medical history significant for GERD, DM type II, hemorrhoids, Barrett esophagus, and diverticulosis who presents for an evaluation of a relative monocytosis.  After review of the labs, review of the records, and discussion with the patient the patients findings are most consistent with a mild neutropenia.  Although the total percentage of the patient's monocytes are elevated the actual monocyte count is within normal limits.  The reason why the monocyte percentage appears so high relative to the counts visit the patient has had a decline in his neutrophils which are now at 1100.  As such our work-up will be that of a mild neutropenia with sudden decline compared to prior baseline.  Frankly I do believe that the etiology of this has been transient and he had some kind of viral infection which caused a temporary drop in his white blood cell count and neutrophils.  Other possible etiologies would include nutritional etiology, liver disease, or bone marrow disorder.  We will begin to work-up  all of these etiologies today.  In the event that no clear etiology can be found in his counts rebounded to normal will discharge into the care of his PCP with prompt return to our clinic for new or worsening symptoms.  # Relative Monocytosis # Leukopenia, Mild Neutropenia -- Findings are consistent with a mild  neutropenia that appeared suddenly over the last 6 months -- We will perform nutritional studies with vitamin B12, folate -- start a viral work-up with hepatitis B, C, and HIV -- We will review a peripheral blood film -- today will order new baseline CBC, CMP, and LDH -- Labs resolved today and the etiology was most likely a transient factor, such as a viral infection -- We will have the patient return to clinic if any of the above labs are abnormalities.  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 CBC with Differential (Cancer Center Only)    Standing Status:   Future    Number of Occurrences:   1    Standing Expiration Date:   01/03/2022  . CMP (Cancer Center only)    Standing Status:   Future    Number of Occurrences:   1    Standing Expiration Date:   01/03/2022  . Lactate dehydrogenase (LDH)    Standing Status:   Future    Number of Occurrences:   1    Standing Expiration Date:   01/03/2022  . Folate, Serum    Standing Status:   Future    Number of Occurrences:   1    Standing Expiration Date:   01/03/2022  . Vitamin B12    Standing Status:   Future    Number of Occurrences:   1    Standing Expiration Date:   01/03/2022  . SPEP with reflex to IFE    Standing Status:   Future    Number of Occurrences:   1    Standing Expiration Date:   01/03/2022  . Hepatitis C antibody    Standing Status:   Future    Number of Occurrences:   1    Standing Expiration Date:   01/03/2022  . Hepatitis B core antibody, total    Standing Status:   Future    Number of Occurrences:   1    Standing Expiration Date:   01/03/2022  . Hepatitis B surface antigen    Standing Status:   Future    Number of Occurrences:   1    Standing Expiration Date:   01/03/2022    All  questions were answered. The patient knows to call the clinic with any problems, questions or concerns.  A total of more than 60 minutes were spent on this encounter with face-to-face time and non-face-to-face time, including preparing to see the patient, ordering tests and/or medications, counseling the patient and coordination of care as outlined above.   Ulysees Barns, MD Department of Hematology/Oncology [facility] Phone: [phone] Pager: [phone] Email: [email or web address]  01/03/2021 5:29 PM

## 2021-01-07 LAB — PROTEIN ELECTROPHORESIS, SERUM, WITH REFLEX
A/G Ratio: 1.7 (ref 0.7–1.7)
Albumin ELP: 4 g/dL (ref 2.9–4.4)
Alpha-1-Globulin: 0.2 g/dL (ref 0.0–0.4)
Alpha-2-Globulin: 0.6 g/dL (ref 0.4–1.0)
Beta Globulin: 0.9 g/dL (ref 0.7–1.3)
Gamma Globulin: 0.8 g/dL (ref 0.4–1.8)
Globulin, Total: 2.4 g/dL (ref 2.2–3.9)
Total Protein ELP: 6.4 g/dL (ref 6.0–8.5)

## 2021-01-09 ENCOUNTER — Telehealth: Payer: Self-pay | Admitting: *Deleted

## 2021-01-09 NOTE — Telephone Encounter (Signed)
-----   Message from Jaci Standard, MD sent at 01/08/2021  1:42 PM EDT ----- Please let Mr. Nathan Park know that his neutropenia (low WBC count) improved on our last check. It has increased back up to normal levels. There is no need for routine f/u in our clinic, we'd be happy to see him back if the labs have new or worsening changes in the future.   ----- Message ----- From: Nathan Park, Lab In [place] Sent: 01/03/2021   3:31 PM EDT To: Jaci Standard, MD

## 2021-01-09 NOTE — Telephone Encounter (Signed)
TCT patient regarding recent lab results. No answer but was able to leave vm message for pt to call back regarding results @ [phone]

## 2021-01-20 ENCOUNTER — Other Ambulatory Visit: Payer: Self-pay | Admitting: Internal Medicine

## 2021-01-20 DIAGNOSIS — N138 Other obstructive and reflux uropathy: Secondary | ICD-10-CM

## 2021-01-20 DIAGNOSIS — E118 Type 2 diabetes mellitus with unspecified complications: Secondary | ICD-10-CM

## 2021-01-20 DIAGNOSIS — N401 Enlarged prostate with lower urinary tract symptoms: Secondary | ICD-10-CM

## 2021-01-20 MED ORDER — DUTASTERIDE 0.5 MG PO CAPS: 0.5000 mg | ORAL_CAPSULE | Freq: Every day | ORAL | 1 refills | Status: DC

## 2021-01-22 ENCOUNTER — Telehealth: Payer: Self-pay | Admitting: *Deleted

## 2021-01-22 NOTE — Telephone Encounter (Signed)
Look like we attempted to contact him on 01/09/2021 and left a message. Please let Nathan Park know that his neutropenia (low WBC count) improved on our last check. It has increased back up to normal levels.  There were no other abnormalities noted in his labs. There is no need for routine f/u in our clinic, we'd be happy to see him back if the labs have new or worsening changes in the future.

## 2021-01-22 NOTE — Telephone Encounter (Signed)
Patient called wanting results from his labs.  He states he saw them online but wants a interpretation.    Routed to Dr Leonides Schanz to advise how to proceed.

## 2021-01-22 NOTE — Telephone Encounter (Signed)
Communicated results to patient.  No further questions.

## 2021-02-14 ENCOUNTER — Other Ambulatory Visit: Payer: Self-pay | Admitting: Internal Medicine

## 2021-02-14 DIAGNOSIS — E118 Type 2 diabetes mellitus with unspecified complications: Secondary | ICD-10-CM

## 2021-02-23 ENCOUNTER — Other Ambulatory Visit: Payer: Self-pay | Admitting: Internal Medicine

## 2021-02-23 DIAGNOSIS — N5201 Erectile dysfunction due to arterial insufficiency: Secondary | ICD-10-CM

## 2021-05-31 ENCOUNTER — Other Ambulatory Visit: Payer: Self-pay | Admitting: Internal Medicine

## 2021-05-31 DIAGNOSIS — N5201 Erectile dysfunction due to arterial insufficiency: Secondary | ICD-10-CM

## 2021-08-26 DIAGNOSIS — U071 COVID-19: Secondary | ICD-10-CM | POA: Diagnosis not present

## 2021-08-26 DIAGNOSIS — J101 Influenza due to other identified influenza virus with other respiratory manifestations: Secondary | ICD-10-CM | POA: Diagnosis not present

## 2021-08-26 DIAGNOSIS — Z03818 Encounter for observation for suspected exposure to other biological agents ruled out: Secondary | ICD-10-CM | POA: Diagnosis not present

## 2021-09-09 ENCOUNTER — Encounter: Payer: Self-pay | Admitting: Internal Medicine

## 2021-09-09 ENCOUNTER — Other Ambulatory Visit: Payer: Self-pay | Admitting: Internal Medicine

## 2021-09-09 DIAGNOSIS — E118 Type 2 diabetes mellitus with unspecified complications: Secondary | ICD-10-CM

## 2021-09-12 ENCOUNTER — Other Ambulatory Visit: Payer: Self-pay

## 2021-09-12 ENCOUNTER — Ambulatory Visit: Payer: Federal, State, Local not specified - PPO | Admitting: Internal Medicine

## 2021-09-12 ENCOUNTER — Encounter: Payer: Self-pay | Admitting: Internal Medicine

## 2021-09-12 VITALS — BP 116/76 | HR 71 | Temp 97.7°F | Ht 66.0 in | Wt 179.0 lb

## 2021-09-12 DIAGNOSIS — E118 Type 2 diabetes mellitus with unspecified complications: Secondary | ICD-10-CM

## 2021-09-12 DIAGNOSIS — Z0000 Encounter for general adult medical examination without abnormal findings: Secondary | ICD-10-CM

## 2021-09-12 DIAGNOSIS — D696 Thrombocytopenia, unspecified: Secondary | ICD-10-CM | POA: Diagnosis not present

## 2021-09-12 DIAGNOSIS — Z125 Encounter for screening for malignant neoplasm of prostate: Secondary | ICD-10-CM | POA: Diagnosis not present

## 2021-09-12 DIAGNOSIS — Z23 Encounter for immunization: Secondary | ICD-10-CM | POA: Diagnosis not present

## 2021-09-12 DIAGNOSIS — R972 Elevated prostate specific antigen [PSA]: Secondary | ICD-10-CM

## 2021-09-12 DIAGNOSIS — Z794 Long term (current) use of insulin: Secondary | ICD-10-CM

## 2021-09-12 DIAGNOSIS — E785 Hyperlipidemia, unspecified: Secondary | ICD-10-CM | POA: Diagnosis not present

## 2021-09-12 DIAGNOSIS — Z1211 Encounter for screening for malignant neoplasm of colon: Secondary | ICD-10-CM | POA: Insufficient documentation

## 2021-09-12 LAB — LIPID PANEL
Cholesterol: 155 mg/dL (ref 0–200)
HDL: 51.6 mg/dL (ref >39.00)
LDL Cholesterol: 89 mg/dL (ref 0–99)
NonHDL: 103.11
Total CHOL/HDL Ratio: 3
Triglycerides: 69 mg/dL (ref 0.0–149.0)
VLDL: 13.8 mg/dL (ref 0.0–40.0)

## 2021-09-12 LAB — CBC WITH DIFFERENTIAL/PLATELET
Basophils Absolute: 0 10*3/uL (ref 0.0–0.1)
Basophils Relative: 0.7 % (ref 0.0–3.0)
Eosinophils Absolute: 0.3 10*3/uL (ref 0.0–0.7)
Eosinophils Relative: 5.1 % — ABNORMAL HIGH (ref 0.0–5.0)
HCT: 45.1 % (ref 39.0–52.0)
Hemoglobin: 15.3 g/dL (ref 13.0–17.0)
Lymphocytes Relative: 29.5 % (ref 12.0–46.0)
Lymphs Abs: 2 10*3/uL (ref 0.7–4.0)
MCHC: 34 g/dL (ref 30.0–36.0)
MCV: 88.8 fl (ref 78.0–100.0)
Monocytes Absolute: 0.6 10*3/uL (ref 0.1–1.0)
Monocytes Relative: 9.5 % (ref 3.0–12.0)
Neutro Abs: 3.7 10*3/uL (ref 1.4–7.7)
Neutrophils Relative %: 55.2 % (ref 43.0–77.0)
Platelets: 199 10*3/uL (ref 150.0–400.0)
RBC: 5.08 Mil/uL (ref 4.22–5.81)
RDW: 13.1 % (ref 11.5–15.5)
WBC: 6.7 10*3/uL (ref 4.0–10.5)

## 2021-09-12 LAB — BASIC METABOLIC PANEL
BUN: 17 mg/dL (ref 6–23)
CO2: 29 mEq/L (ref 19–32)
Calcium: 9.1 mg/dL (ref 8.4–10.5)
Chloride: 103 mEq/L (ref 96–112)
Creatinine, Ser: 0.99 mg/dL (ref 0.40–1.50)
GFR: 84.5 mL/min (ref >60.00)
Glucose, Bld: 117 mg/dL — ABNORMAL HIGH (ref 70–99)
Potassium: 3.8 mEq/L (ref 3.5–5.1)
Sodium: 140 mEq/L (ref 135–145)

## 2021-09-12 LAB — URINALYSIS, ROUTINE W REFLEX MICROSCOPIC
Bilirubin Urine: NEGATIVE
Hgb urine dipstick: NEGATIVE
Ketones, ur: NEGATIVE
Leukocytes,Ua: NEGATIVE
Nitrite: NEGATIVE
RBC / HPF: NONE SEEN (ref 0–?)
Specific Gravity, Urine: 1.02 (ref 1.000–1.030)
Total Protein, Urine: NEGATIVE
Urine Glucose: NEGATIVE
Urobilinogen, UA: 0.2 (ref 0.0–1.0)
pH: 6.5 (ref 5.0–8.0)

## 2021-09-12 LAB — PSA: PSA: 5.12 ng/mL — ABNORMAL HIGH (ref 0.10–4.00)

## 2021-09-12 LAB — MICROALBUMIN / CREATININE URINE RATIO
Creatinine,U: 202 mg/dL
Microalb Creat Ratio: 0.6 mg/g (ref 0.0–30.0)
Microalb, Ur: 1.3 mg/dL (ref 0.0–1.9)

## 2021-09-12 LAB — HEPATIC FUNCTION PANEL
ALT: 37 U/L (ref 0–53)
AST: 28 U/L (ref 0–37)
Albumin: 4.7 g/dL (ref 3.5–5.2)
Alkaline Phosphatase: 46 U/L (ref 39–117)
Bilirubin, Direct: 0.2 mg/dL (ref 0.0–0.3)
Total Bilirubin: 0.8 mg/dL (ref 0.2–1.2)
Total Protein: 7 g/dL (ref 6.0–8.3)

## 2021-09-12 LAB — TSH: TSH: 0.61 u[IU]/mL (ref 0.35–5.50)

## 2021-09-12 MED ORDER — OZEMPIC (1 MG/DOSE) 4 MG/3ML ~~LOC~~ SOPN: 1.0000 mg | PEN_INJECTOR | SUBCUTANEOUS | 1 refills | Status: DC

## 2021-09-12 NOTE — Progress Notes (Signed)
Subjective:  Patient ID: Nathan Park, male    DOB: [birth year]  Age: 56 y.o. MRN: [id number]  CC: Annual Exam, Diabetes, and Hyperlipidemia  This visit occurred during the SARS-CoV-2 public health emergency.  Safety protocols were in place, including screening questions prior to the visit, additional usage of staff PPE, and extensive cleaning of exam room while observing appropriate contact time as indicated for disinfecting solutions.    HPI Nathan Park presents for a CPX and f/up -   He is active and denies chest pain, shortness of breath, diaphoresis, dizziness, lightheadedness, or edema.  Outpatient Medications Prior to Visit  Medication Sig Dispense Refill   dutasteride (AVODART) 0.5 MG capsule Take 1 capsule (0.5 mg total) by mouth daily. 90 capsule 1   EMGALITY 120 MG/ML SOAJ INJECT 1 ACT INTO THE SKIN EVERY 30 (THIRTY) DAYS. 1 mL 5   glucose blood (ONE TOUCH ULTRA TEST) test strip Use as instructed 100 each 12   omega-3 acid ethyl esters (LOVAZA) 1 g capsule TAKE 2 CAPSULES (2 G TOTAL) BY MOUTH 2 (TWO) TIMES DAILY. 360 capsule 1   ONETOUCH DELICA LANCETS 33G MISC Test up to BID. DX: E11.9 200 each 4   tadalafil (CIALIS) 20 MG tablet TAKE ONE TABLET BY MOUTH DAILY AS NEEDED FOR ERECTILE DYSFUNCTION 7 tablet 2   OZEMPIC, 1 MG/DOSE, 4 MG/3ML SOPN INJECT 0.75 MLS (1 MG TOTAL) INTO THE SKIN ONCE A WEEK. 9 mL 1   rosuvastatin (CRESTOR) 5 MG tablet TAKE 1 TABLET BY MOUTH EVERY DAY 90 tablet 1   No facility-administered medications prior to visit.    ROS Review of Systems  Constitutional:  Negative for chills, diaphoresis, fatigue and fever.  HENT: Negative.    Eyes: Negative.   Respiratory:  Negative for cough, chest tightness, shortness of breath and wheezing.   Cardiovascular:  Negative for chest pain, palpitations and leg swelling.  Gastrointestinal: Negative.  Negative for abdominal pain, constipation, diarrhea, nausea and vomiting.  Endocrine: Negative.   Genitourinary:  Negative.  Negative for difficulty urinating, scrotal swelling and testicular pain.  Musculoskeletal: Negative.  Negative for myalgias.  Skin: Negative.   Neurological: Negative.  Negative for dizziness, weakness and light-headedness.  Hematological:  Negative for adenopathy. Does not bruise/bleed easily.  Psychiatric/Behavioral: Negative.     Objective:  BP 116/76 (BP Location: Right Arm, Patient Position: Sitting, Cuff Size: Large)    Pulse 71    Temp 97.7 F (36.5 C) (Oral)    Ht 5\' 6"  (1.676 m)    Wt 179 lb (81.2 kg)    SpO2 98%    BMI 28.89 kg/m   BP Readings from Last 3 Encounters:  09/12/21 116/76  01/03/21 104/81  12/26/20 114/80    Wt Readings from Last 3 Encounters:  09/12/21 179 lb (81.2 kg)  01/03/21 167 lb 8 oz (76 kg)  12/26/20 170 lb (77.1 kg)    Physical Exam Vitals reviewed.  HENT:     Nose: Nose normal.     Mouth/Throat:     Mouth: Mucous membranes are moist.  Eyes:     General: No scleral icterus.    Conjunctiva/sclera: Conjunctivae normal.  Cardiovascular:     Rate and Rhythm: Normal rate and regular rhythm.     Heart sounds: No murmur heard. Pulmonary:     Effort: Pulmonary effort is normal.     Breath sounds: No stridor. No wheezing, rhonchi or rales.  Abdominal:     General: Abdomen is flat.  Palpations: There is no mass.     Tenderness: There is no abdominal tenderness. There is no guarding.     Hernia: No hernia is present. There is no hernia in the left inguinal area or right inguinal area.  Genitourinary:    Pubic Area: No rash.      Penis: Normal and circumcised.      Testes: Normal.     Epididymis:     Right: Normal.     Left: Normal.     Prostate: Enlarged. Not tender and no nodules present.     Rectum: Normal. Guaiac result negative. No mass, tenderness, anal fissure, external hemorrhoid or internal hemorrhoid. Normal anal tone.  Musculoskeletal:        General: Normal range of motion.     Cervical back: Neck supple.     Right  lower leg: No edema.     Left lower leg: No edema.  Lymphadenopathy:     Cervical: No cervical adenopathy.     Lower Body: No right inguinal adenopathy. No left inguinal adenopathy.  Skin:    General: Skin is warm and dry.     Coloration: Skin is not pale.  Neurological:     General: No focal deficit present.     Mental Status: He is alert. Mental status is at baseline.  Psychiatric:        Mood and Affect: Mood normal.        Behavior: Behavior normal.    Lab Results  Component Value Date   WBC 6.7 09/12/2021   HGB 15.3 09/12/2021   HCT 45.1 09/12/2021   PLT 199.0 09/12/2021   GLUCOSE 117 (H) 09/12/2021   CHOL 155 09/12/2021   TRIG 69.0 09/12/2021   HDL 51.60 09/12/2021   LDLDIRECT 54.0 03/10/2019   LDLCALC 89 09/12/2021   ALT 37 09/12/2021   AST 28 09/12/2021   NA 140 09/12/2021   K 3.8 09/12/2021   CL 103 09/12/2021   CREATININE 0.99 09/12/2021   BUN 17 09/12/2021   CO2 29 09/12/2021   TSH 0.61 09/12/2021   PSA 5.12 (H) 09/12/2021   INR 1.08 03/25/2010   HGBA1C 6.6 (H) 09/12/2021   MICROALBUR 1.3 09/12/2021    DG Mandible 4 Views  Result Date: 03/10/2019 CLINICAL DATA:  Right-sided jaw pain for 4-5 days. EXAM: MANDIBLE - 4+ VIEW COMPARISON:  None. FINDINGS: There is no evidence of fracture or other focal bone lesions. IMPRESSION: Normal mandible. Electronically Signed   By: Francene BoyersJames  Maxwell M.D.   On: 03/10/2019 16:07    Assessment & Plan:   Abhi was seen today for annual exam, diabetes and hyperlipidemia.  Diagnoses and all orders for this visit:  Hyperlipidemia LDL goal <70- He has not achieved his LDL goal.  Will restart the statin. -     Hepatic function panel; Future -     TSH; Future -     TSH -     Hepatic function panel -     rosuvastatin (CRESTOR) 5 MG tablet; Take 1 tablet (5 mg total) by mouth daily.  Type II diabetes mellitus with manifestations (HCC)- His blood sugar is adequately well controlled.  Will continue the current dose of the GLP-1  agonist. -     Semaglutide, 1 MG/DOSE, (OZEMPIC, 1 MG/DOSE,) 4 MG/3ML SOPN; Inject 1 mg into the skin once a week. -     Microalbumin / creatinine urine ratio; Future -     Basic metabolic panel; Future -  Hemoglobin A1c; Future -     Urinalysis, Routine w reflex microscopic; Future -     Urinalysis, Routine w reflex microscopic -     Hemoglobin A1c -     Basic metabolic panel -     Microalbumin / creatinine urine ratio -     HM Diabetes Foot Exam  PSA elevation- His PSA has risen slightly.  Will recheck this in 3 months. -     Urinalysis, Routine w reflex microscopic; Future -     Urinalysis, Routine w reflex microscopic  Type 2 diabetes mellitus with complication, without long-term current use of insulin (HCC) -     Microalbumin / creatinine urine ratio; Future -     Hemoglobin A1c; Future -     Urinalysis, Routine w reflex microscopic; Future -     Urinalysis, Routine w reflex microscopic -     Hemoglobin A1c -     Microalbumin / creatinine urine ratio  Routine general medical examination at a health care facility- Exam completed, labs reviewed, vaccines reviewed and updated, cancer screenings addressed, patient education was given. -     Lipid panel; Future -     PSA; Future -     PSA -     Lipid panel  Thrombocytopenia (HCC)- His platelet count is normal now. -     CBC with Differential/Platelet; Future -     CBC with Differential/Platelet  Colon cancer screening -     Cologuard  Type 2 diabetes mellitus with complication, with long-term current use of insulin (HCC)  Other orders -     Tdap vaccine greater than or equal to 7yo IM   I have changed Kanan S. Montejano's Ozempic (1 MG/DOSE) and rosuvastatin. I am also having him maintain his glucose blood, OneTouch Delica Lancets 33G, Emgality, omega-3 acid ethyl esters, dutasteride, and tadalafil.  Meds ordered this encounter  Medications   Semaglutide, 1 MG/DOSE, (OZEMPIC, 1 MG/DOSE,) 4 MG/3ML SOPN    Sig: Inject 1 mg  into the skin once a week.    Dispense:  9 mL    Refill:  1   rosuvastatin (CRESTOR) 5 MG tablet    Sig: Take 1 tablet (5 mg total) by mouth daily.    Dispense:  90 tablet    Refill:  1     Follow-up: Return in about 6 months (around 03/12/2022).  Sanda Linger, MD

## 2021-09-12 NOTE — Patient Instructions (Signed)

## 2021-09-13 LAB — HEMOGLOBIN A1C: Hgb A1c MFr Bld: 6.6 % — ABNORMAL HIGH (ref 4.6–6.5)

## 2021-09-13 MED ORDER — ROSUVASTATIN CALCIUM 5 MG PO TABS: 5.0000 mg | ORAL_TABLET | Freq: Every day | ORAL | 1 refills | Status: DC

## 2021-09-18 ENCOUNTER — Other Ambulatory Visit: Payer: Self-pay | Admitting: Internal Medicine

## 2021-09-18 DIAGNOSIS — N5201 Erectile dysfunction due to arterial insufficiency: Secondary | ICD-10-CM

## 2021-10-15 ENCOUNTER — Encounter: Payer: Self-pay | Admitting: Internal Medicine

## 2021-10-18 ENCOUNTER — Encounter: Payer: Self-pay | Admitting: Internal Medicine

## 2021-10-18 ENCOUNTER — Telehealth: Payer: Self-pay

## 2021-10-18 NOTE — Telephone Encounter (Signed)
Key: [id number] ?

## 2021-10-24 NOTE — Telephone Encounter (Signed)
No response from insurance in regard to the first pt initiated. I have initiated another PA and marked it as urgent. Key below.  ? ?Key: [id number] ?

## 2021-10-26 ENCOUNTER — Other Ambulatory Visit: Payer: Self-pay | Admitting: Internal Medicine

## 2021-10-26 DIAGNOSIS — G43011 Migraine without aura, intractable, with status migrainosus: Secondary | ICD-10-CM

## 2021-10-26 MED ORDER — NURTEC 75 MG PO TBDP: 1.0000 | ORAL_TABLET | ORAL | 1 refills | Status: DC

## 2021-10-29 NOTE — Telephone Encounter (Signed)
Called pt, LVM.   

## 2021-11-18 ENCOUNTER — Telehealth: Payer: Self-pay

## 2021-11-18 NOTE — Telephone Encounter (Signed)
Hardcopy PA for [employer] received, completed, and faxed back for determination.  ?

## 2021-11-19 LAB — COLOGUARD: COLOGUARD: NEGATIVE

## 2021-12-05 NOTE — Telephone Encounter (Signed)
Additional information requested. ? ?Form completed and faxed back.  ?

## 2021-12-13 ENCOUNTER — Other Ambulatory Visit: Payer: Self-pay | Admitting: Internal Medicine

## 2021-12-13 DIAGNOSIS — N5201 Erectile dysfunction due to arterial insufficiency: Secondary | ICD-10-CM

## 2021-12-16 ENCOUNTER — Encounter: Payer: Self-pay | Admitting: Internal Medicine

## 2021-12-16 ENCOUNTER — Ambulatory Visit: Payer: Federal, State, Local not specified - PPO | Admitting: Internal Medicine

## 2021-12-16 VITALS — BP 124/78 | HR 69 | Temp 97.8°F | Resp 16 | Ht 66.0 in | Wt 182.0 lb

## 2021-12-16 DIAGNOSIS — E118 Type 2 diabetes mellitus with unspecified complications: Secondary | ICD-10-CM

## 2021-12-16 DIAGNOSIS — R972 Elevated prostate specific antigen [PSA]: Secondary | ICD-10-CM | POA: Diagnosis not present

## 2021-12-16 LAB — BASIC METABOLIC PANEL
BUN: 16 mg/dL (ref 6–23)
CO2: 28 mEq/L (ref 19–32)
Calcium: 9.2 mg/dL (ref 8.4–10.5)
Chloride: 103 mEq/L (ref 96–112)
Creatinine, Ser: 0.99 mg/dL (ref 0.40–1.50)
GFR: 84.34 mL/min (ref >60.00)
Glucose, Bld: 107 mg/dL — ABNORMAL HIGH (ref 70–99)
Potassium: 4.3 mEq/L (ref 3.5–5.1)
Sodium: 140 mEq/L (ref 135–145)

## 2021-12-16 LAB — HEMOGLOBIN A1C: Hgb A1c MFr Bld: 6.1 % (ref 4.6–6.5)

## 2021-12-16 LAB — PSA: PSA: 3.02 ng/mL (ref 0.10–4.00)

## 2021-12-16 NOTE — Patient Instructions (Signed)

## 2021-12-16 NOTE — Progress Notes (Signed)
? ?Subjective:  ?Patient ID: Nathan Park, male    DOB: 1965-12-31  Age: 56 y.o. MRN: [id number] ? ?CC: Diabetes ? ? ?HPI ?Nathan Park presents for f/up -  ? ?His blood sugars have been well controlled.  He denies polys.  He is active and denies chest pain, shortness of breath, diaphoresis, or edema. ? ?Outpatient Medications Prior to Visit  ?Medication Sig Dispense Refill  ? dutasteride (AVODART) 0.5 MG capsule Take 1 capsule (0.5 mg total) by mouth daily. 90 capsule 1  ? EMGALITY 120 MG/ML SOAJ INJECT 1 ACT INTO THE SKIN EVERY 30 (THIRTY) DAYS. 1 mL 5  ? glucose blood (ONE TOUCH ULTRA TEST) test strip Use as instructed 100 each 12  ? omega-3 acid ethyl esters (LOVAZA) 1 g capsule TAKE 2 CAPSULES (2 G TOTAL) BY MOUTH 2 (TWO) TIMES DAILY. 360 capsule 1  ? ONETOUCH DELICA LANCETS 99991111 MISC Test up to BID. DX: E11.9 200 each 4  ? Rimegepant Sulfate (NURTEC) 75 MG TBDP Take 1 tablet by mouth every other day. 46 tablet 1  ? rosuvastatin (CRESTOR) 5 MG tablet Take 1 tablet (5 mg total) by mouth daily. 90 tablet 1  ? Semaglutide, 1 MG/DOSE, (OZEMPIC, 1 MG/DOSE,) 4 MG/3ML SOPN Inject 1 mg into the skin once a week. 9 mL 1  ? tadalafil (CIALIS) 20 MG tablet TAKE ONE TABLET BY MOUTH DAILY AS NEEDED FOR ERECTILE DYSFUNCTION 7 tablet 2  ? ?No facility-administered medications prior to visit.  ? ? ?ROS ?Review of Systems  ?Constitutional:  Negative for chills, diaphoresis, fatigue and fever.  ?HENT: Negative.    ?Eyes: Negative.   ?Respiratory:  Negative for cough, chest tightness, shortness of breath and wheezing.   ?Cardiovascular:  Negative for chest pain, palpitations and leg swelling.  ?Gastrointestinal:  Negative for abdominal pain, constipation, diarrhea, nausea and vomiting.  ?Endocrine: Negative.   ?Genitourinary: Negative.  Negative for difficulty urinating.  ?Musculoskeletal: Negative.  Negative for arthralgias and myalgias.  ?Skin: Negative.   ?Neurological:  Negative for dizziness, weakness, light-headedness and  headaches.  ?Hematological:  Negative for adenopathy. Does not bruise/bleed easily.  ?Psychiatric/Behavioral: Negative.    ? ?Objective:  ?BP 124/78 (BP Location: Left Arm, Patient Position: Sitting, Cuff Size: Large)   Pulse 69   Temp 97.8 ?F (36.6 ?C) (Oral)   Resp 16   Ht 5\' 6"  (1.676 m)   Wt 182 lb (82.6 kg)   SpO2 96%   BMI 29.38 kg/m?  ? ?BP Readings from Last 3 Encounters:  ?12/16/21 124/78  ?09/12/21 116/76  ?01/03/21 104/81  ? ? ?Wt Readings from Last 3 Encounters:  ?12/16/21 182 lb (82.6 kg)  ?09/12/21 179 lb (81.2 kg)  ?01/03/21 167 lb 8 oz (76 kg)  ? ? ?Physical Exam ?Vitals reviewed.  ?HENT:  ?   Nose: Nose normal.  ?   Mouth/Throat:  ?   Mouth: Mucous membranes are moist.  ?Eyes:  ?   General: No scleral icterus. ?   Conjunctiva/sclera: Conjunctivae normal.  ?Cardiovascular:  ?   Rate and Rhythm: Normal rate and regular rhythm.  ?   Heart sounds: No murmur heard. ?Pulmonary:  ?   Effort: Pulmonary effort is normal.  ?   Breath sounds: No stridor. No wheezing, rhonchi or rales.  ?Abdominal:  ?   General: Abdomen is flat.  ?   Palpations: There is no mass.  ?   Tenderness: There is no abdominal tenderness. There is no guarding.  ?  Hernia: No hernia is present.  ?Musculoskeletal:     ?   General: Normal range of motion.  ?   Cervical back: Neck supple.  ?   Right lower leg: No edema.  ?   Left lower leg: No edema.  ?Lymphadenopathy:  ?   Cervical: No cervical adenopathy.  ?Skin: ?   General: Skin is warm and dry.  ?   Coloration: Skin is not pale.  ?Neurological:  ?   General: No focal deficit present.  ?   Mental Status: He is alert. Mental status is at baseline.  ?Psychiatric:     ?   Mood and Affect: Mood normal.     ?   Behavior: Behavior normal.  ? ? ?Lab Results  ?Component Value Date  ? WBC 6.7 09/12/2021  ? HGB 15.3 09/12/2021  ? HCT 45.1 09/12/2021  ? PLT 199.0 09/12/2021  ? GLUCOSE 107 (H) 12/16/2021  ? CHOL 155 09/12/2021  ? TRIG 69.0 09/12/2021  ? HDL 51.60 09/12/2021  ? LDLDIRECT  54.0 03/10/2019  ? [place] 89 09/12/2021  ? ALT 37 09/12/2021  ? AST 28 09/12/2021  ? NA 140 12/16/2021  ? K 4.3 12/16/2021  ? CL 103 12/16/2021  ? CREATININE 0.99 12/16/2021  ? BUN 16 12/16/2021  ? CO2 28 12/16/2021  ? TSH 0.61 09/12/2021  ? PSA 3.02 12/16/2021  ? INR 1.08 03/25/2010  ? HGBA1C 6.1 12/16/2021  ? MICROALBUR 1.3 09/12/2021  ? ? ?DG Mandible 4 Views ? ?Result Date: 03/10/2019 ?CLINICAL DATA:  Right-sided jaw pain for 4-5 days. EXAM: MANDIBLE - 4+ VIEW COMPARISON:  None. FINDINGS: There is no evidence of fracture or other focal bone lesions. IMPRESSION: Normal mandible. Electronically Signed   By: Lorriane Shire M.D.   On: 03/10/2019 16:07  ? ? ?Assessment & Plan:  ? ?Kamerin was seen today for diabetes. ? ?Diagnoses and all orders for this visit: ? ?PSA elevation- His PSA is lower than it was previously.  This is a reassuring sign. ?-     PSA; Future ?-     PSA ? ?Type 2 diabetes mellitus with complication, without long-term current use of insulin ([place])- His blood sugar is adequately well controlled. ?-     Basic metabolic panel; Future ?-     Hemoglobin A1c; Future ?-     Hemoglobin A1c ?-     Basic metabolic panel ? ? ?I am having Nathan Park maintain his glucose blood, OneTouch Delica Lancets 99991111, Emgality, omega-3 acid ethyl esters, dutasteride, Ozempic (1 MG/DOSE), rosuvastatin, Nurtec, and tadalafil. ? ?No orders of the defined types were placed in this encounter. ? ? ? ?Follow-up: Return in about 6 months (around 06/18/2022). ? ?Scarlette Calico, MD ?

## 2022-01-14 DIAGNOSIS — M5386 Other specified dorsopathies, lumbar region: Secondary | ICD-10-CM | POA: Diagnosis not present

## 2022-01-14 DIAGNOSIS — M9902 Segmental and somatic dysfunction of thoracic region: Secondary | ICD-10-CM | POA: Diagnosis not present

## 2022-01-14 DIAGNOSIS — M9903 Segmental and somatic dysfunction of lumbar region: Secondary | ICD-10-CM | POA: Diagnosis not present

## 2022-01-14 DIAGNOSIS — M9904 Segmental and somatic dysfunction of sacral region: Secondary | ICD-10-CM | POA: Diagnosis not present

## 2022-01-21 DIAGNOSIS — M9903 Segmental and somatic dysfunction of lumbar region: Secondary | ICD-10-CM | POA: Diagnosis not present

## 2022-01-21 DIAGNOSIS — M9904 Segmental and somatic dysfunction of sacral region: Secondary | ICD-10-CM | POA: Diagnosis not present

## 2022-01-21 DIAGNOSIS — M5386 Other specified dorsopathies, lumbar region: Secondary | ICD-10-CM | POA: Diagnosis not present

## 2022-01-21 DIAGNOSIS — M9902 Segmental and somatic dysfunction of thoracic region: Secondary | ICD-10-CM | POA: Diagnosis not present

## 2022-01-25 DIAGNOSIS — M9903 Segmental and somatic dysfunction of lumbar region: Secondary | ICD-10-CM | POA: Diagnosis not present

## 2022-01-25 DIAGNOSIS — M5386 Other specified dorsopathies, lumbar region: Secondary | ICD-10-CM | POA: Diagnosis not present

## 2022-01-25 DIAGNOSIS — M9902 Segmental and somatic dysfunction of thoracic region: Secondary | ICD-10-CM | POA: Diagnosis not present

## 2022-01-25 DIAGNOSIS — M9904 Segmental and somatic dysfunction of sacral region: Secondary | ICD-10-CM | POA: Diagnosis not present

## 2022-01-29 DIAGNOSIS — M9903 Segmental and somatic dysfunction of lumbar region: Secondary | ICD-10-CM | POA: Diagnosis not present

## 2022-01-29 DIAGNOSIS — M5386 Other specified dorsopathies, lumbar region: Secondary | ICD-10-CM | POA: Diagnosis not present

## 2022-01-29 DIAGNOSIS — M9904 Segmental and somatic dysfunction of sacral region: Secondary | ICD-10-CM | POA: Diagnosis not present

## 2022-01-29 DIAGNOSIS — M9902 Segmental and somatic dysfunction of thoracic region: Secondary | ICD-10-CM | POA: Diagnosis not present

## 2022-02-03 DIAGNOSIS — M9904 Segmental and somatic dysfunction of sacral region: Secondary | ICD-10-CM | POA: Diagnosis not present

## 2022-02-03 DIAGNOSIS — M9903 Segmental and somatic dysfunction of lumbar region: Secondary | ICD-10-CM | POA: Diagnosis not present

## 2022-02-03 DIAGNOSIS — M5386 Other specified dorsopathies, lumbar region: Secondary | ICD-10-CM | POA: Diagnosis not present

## 2022-02-03 DIAGNOSIS — M9902 Segmental and somatic dysfunction of thoracic region: Secondary | ICD-10-CM | POA: Diagnosis not present

## 2022-02-10 DIAGNOSIS — M5386 Other specified dorsopathies, lumbar region: Secondary | ICD-10-CM | POA: Diagnosis not present

## 2022-02-10 DIAGNOSIS — M9904 Segmental and somatic dysfunction of sacral region: Secondary | ICD-10-CM | POA: Diagnosis not present

## 2022-02-10 DIAGNOSIS — M9902 Segmental and somatic dysfunction of thoracic region: Secondary | ICD-10-CM | POA: Diagnosis not present

## 2022-02-10 DIAGNOSIS — M9903 Segmental and somatic dysfunction of lumbar region: Secondary | ICD-10-CM | POA: Diagnosis not present

## 2022-02-19 ENCOUNTER — Ambulatory Visit: Payer: Federal, State, Local not specified - PPO | Admitting: Internal Medicine

## 2022-02-20 ENCOUNTER — Other Ambulatory Visit (INDEPENDENT_AMBULATORY_CARE_PROVIDER_SITE_OTHER): Payer: Federal, State, Local not specified - PPO

## 2022-02-20 ENCOUNTER — Ambulatory Visit: Payer: Federal, State, Local not specified - PPO | Admitting: Internal Medicine

## 2022-02-20 ENCOUNTER — Encounter: Payer: Self-pay | Admitting: Internal Medicine

## 2022-02-20 VITALS — BP 130/82 | HR 76 | Temp 98.2°F | Ht 66.0 in | Wt 184.0 lb

## 2022-02-20 DIAGNOSIS — E118 Type 2 diabetes mellitus with unspecified complications: Secondary | ICD-10-CM

## 2022-02-20 DIAGNOSIS — R109 Unspecified abdominal pain: Secondary | ICD-10-CM

## 2022-02-20 DIAGNOSIS — G8929 Other chronic pain: Secondary | ICD-10-CM

## 2022-02-20 LAB — BASIC METABOLIC PANEL
BUN: 22 mg/dL (ref 6–23)
CO2: 29 mEq/L (ref 19–32)
Calcium: 9.2 mg/dL (ref 8.4–10.5)
Chloride: 102 mEq/L (ref 96–112)
Creatinine, Ser: 0.97 mg/dL (ref 0.40–1.50)
GFR: 86.33 mL/min (ref >60.00)
Glucose, Bld: 102 mg/dL — ABNORMAL HIGH (ref 70–99)
Potassium: 4.3 mEq/L (ref 3.5–5.1)
Sodium: 138 mEq/L (ref 135–145)

## 2022-02-20 LAB — HEPATIC FUNCTION PANEL
ALT: 20 U/L (ref 0–53)
AST: 15 U/L (ref 0–37)
Albumin: 4.7 g/dL (ref 3.5–5.2)
Alkaline Phosphatase: 44 U/L (ref 39–117)
Bilirubin, Direct: 0.1 mg/dL (ref 0.0–0.3)
Total Bilirubin: 0.5 mg/dL (ref 0.2–1.2)
Total Protein: 7 g/dL (ref 6.0–8.3)

## 2022-02-20 LAB — LIPASE: Lipase: 37 U/L (ref 11.0–59.0)

## 2022-02-20 NOTE — Patient Instructions (Signed)
Flank Pain, Adult ?Flank pain is pain that is located on the side of the body between the upper abdomen and the spine. This area is called the flank. The pain may occur over a short period of time (acute), or it may be long-term or recurring (chronic). It may be mild or severe. Flank pain can be caused by many things, including: ?Muscle soreness or injury. ?Kidney infection, kidney stones, or kidney disease. ?Stress. ?A disease of the spine (vertebral disk disease). ?A lung infection (pneumonia). ?Fluid around the lungs (pulmonary edema). ?A skin rash caused by the chickenpox virus (shingles). ?Tumors that affect the back of the abdomen. ?Gallbladder disease. ?Follow these instructions at home: ? ?Drink enough fluid to keep your urine pale yellow. ?Rest as told by your health care provider. ?Take over-the-counter and prescription medicines only as told by your health care provider. ?Keep a journal to track what has caused your flank pain and what has made it feel better. ?Keep all follow-up visits. This is important. ?Contact a health care provider if: ?Your pain is not controlled with medicine. ?You have new symptoms. ?Your pain gets worse. ?Your symptoms last longer than 2-3 days. ?You have trouble urinating or you are urinating very frequently. ?Get help right away if: ?You have trouble breathing or you are short of breath. ?Your abdomen hurts or it is swollen or red. ?You have nausea or vomiting. ?You feel faint, or you faint. ?You have blood in your urine. ?You have flank pain and a fever. ?These symptoms may represent a serious problem that is an emergency. Do not wait to see if the symptoms will go away. Get medical help right away. Call your local emergency services (911 in the [place] Do not drive yourself to the hospital. ?Summary ?Flank pain is pain that is located on the side of the body between the upper abdomen and the spine. ?The pain may occur over a short period of time (acute), or it may be  long-term or recurring (chronic). It may be mild or severe. ?Flank pain can be caused by many things. ?Contact your health care provider if your symptoms get worse or last longer than 2-3 days. ?This information is not intended to replace advice given to you by your health care provider. Make sure you discuss any questions you have with your health care provider. ?Document Revised: 10/15/2020 Document Reviewed: 10/15/2020 ?Elsevier Patient Education ? 2023 Else[employer] Inc[employer] ? ?

## 2022-02-20 NOTE — Progress Notes (Unsigned)
Subjective:  Patient ID: Nathan Park, male    DOB: [birth year]  Age: 56 y.o. MRN: [id number]  CC: No chief complaint on file.   HPI Tylyn S Husby presents for ***  Outpatient Medications Prior to Visit  Medication Sig Dispense Refill   glucose blood (ONE TOUCH ULTRA TEST) test strip Use as instructed 100 each 12   omega-3 acid ethyl esters (LOVAZA) 1 g capsule TAKE 2 CAPSULES (2 G TOTAL) BY MOUTH 2 (TWO) TIMES DAILY. 360 capsule 1   ONETOUCH DELICA LANCETS 33G MISC Test up to BID. DX: E11.9 200 each 4   Rimegepant Sulfate (NURTEC) 75 MG TBDP Take 1 tablet by mouth every other day. 46 tablet 1   rosuvastatin (CRESTOR) 5 MG tablet Take 1 tablet (5 mg total) by mouth daily. 90 tablet 1   Semaglutide, 1 MG/DOSE, (OZEMPIC, 1 MG/DOSE,) 4 MG/3ML SOPN Inject 1 mg into the skin once a week. 9 mL 1   tadalafil (CIALIS) 20 MG tablet TAKE ONE TABLET BY MOUTH DAILY AS NEEDED FOR ERECTILE DYSFUNCTION 7 tablet 2   dutasteride (AVODART) 0.5 MG capsule Take 1 capsule (0.5 mg total) by mouth daily. 90 capsule 1   EMGALITY 120 MG/ML SOAJ INJECT 1 ACT INTO THE SKIN EVERY 30 (THIRTY) DAYS. 1 mL 5   No facility-administered medications prior to visit.    ROS Review of Systems  Objective:  BP 130/82 (BP Location: Right Arm, Patient Position: Sitting, Cuff Size: Large)   Pulse 76   Temp 98.2 F (36.8 C) (Oral)   Ht 5\' 6"  (1.676 m)   Wt 184 lb (83.5 kg)   SpO2 98%   BMI 29.70 kg/m   BP Readings from Last 3 Encounters:  02/20/22 130/82  12/16/21 124/78  09/12/21 116/76    Wt Readings from Last 3 Encounters:  02/20/22 184 lb (83.5 kg)  12/16/21 182 lb (82.6 kg)  09/12/21 179 lb (81.2 kg)    Physical Exam  Lab Results  Component Value Date   WBC 7.9 02/20/2022   HGB 15.1 02/20/2022   HCT 43.7 02/20/2022   PLT 211.0 02/20/2022   GLUCOSE 102 (H) 02/20/2022   CHOL 155 09/12/2021   TRIG 69.0 09/12/2021   HDL 51.60 09/12/2021   LDLDIRECT 54.0 03/10/2019   LDLCALC 89 09/12/2021   ALT  20 02/20/2022   AST 15 02/20/2022   NA 138 02/20/2022   K 4.3 02/20/2022   CL 102 02/20/2022   CREATININE 0.97 02/20/2022   BUN 22 02/20/2022   CO2 29 02/20/2022   TSH 0.61 09/12/2021   PSA 3.02 12/16/2021   INR 1.08 03/25/2010   HGBA1C 6.7 (H) 02/20/2022   MICROALBUR 1.3 09/12/2021    DG Mandible 4 Views  Result Date: 03/10/2019 CLINICAL DATA:  Right-sided jaw pain for 4-5 days. EXAM: MANDIBLE - 4+ VIEW COMPARISON:  None. FINDINGS: There is no evidence of fracture or other focal bone lesions. IMPRESSION: Normal mandible. Electronically Signed   By: 03/12/2019 M.D.   On: 03/10/2019 16:07    Assessment & Plan:   Diagnoses and all orders for this visit:  Chronic left flank pain -     Basic metabolic panel; Future -     Lipase; Future -     CBC with Differential/Platelet; Future -     Hepatic function panel; Future -     Urinalysis, Routine w reflex microscopic; Future -     DG ABD ACUTE 2+V W 1V CHEST; Future  Type  2 diabetes mellitus with complication, without long-term current use of insulin ([facility]) -     Hemoglobin A1c; Future   I have discontinued Avir S. Yeske's Emgality and dutasteride. I am also having him maintain his glucose blood, OneTouch Delica Lancets 33G, omega-3 acid ethyl esters, Ozempic (1 MG/DOSE), rosuvastatin, Nurtec, and tadalafil.  No orders of the defined types were placed in this encounter.    Follow-up: Return in about 4 weeks (around 03/20/2022).  Sanda Linger, MD

## 2022-02-21 DIAGNOSIS — M9902 Segmental and somatic dysfunction of thoracic region: Secondary | ICD-10-CM | POA: Diagnosis not present

## 2022-02-21 DIAGNOSIS — M5386 Other specified dorsopathies, lumbar region: Secondary | ICD-10-CM | POA: Diagnosis not present

## 2022-02-21 DIAGNOSIS — M9903 Segmental and somatic dysfunction of lumbar region: Secondary | ICD-10-CM | POA: Diagnosis not present

## 2022-02-21 DIAGNOSIS — M9904 Segmental and somatic dysfunction of sacral region: Secondary | ICD-10-CM | POA: Diagnosis not present

## 2022-02-21 LAB — URINALYSIS, ROUTINE W REFLEX MICROSCOPIC
Bilirubin Urine: NEGATIVE
Hgb urine dipstick: NEGATIVE
Ketones, ur: NEGATIVE
Leukocytes,Ua: NEGATIVE
Nitrite: NEGATIVE
RBC / HPF: NONE SEEN (ref 0–?)
Specific Gravity, Urine: 1.02 (ref 1.000–1.030)
Total Protein, Urine: NEGATIVE
Urine Glucose: NEGATIVE
Urobilinogen, UA: 0.2 (ref 0.0–1.0)
WBC, UA: NONE SEEN (ref 0–?)
pH: 7 (ref 5.0–8.0)

## 2022-02-21 LAB — CBC WITH DIFFERENTIAL/PLATELET
Basophils Absolute: 0 10*3/uL (ref 0.0–0.1)
Basophils Relative: 0.6 % (ref 0.0–3.0)
Eosinophils Absolute: 0.4 10*3/uL (ref 0.0–0.7)
Eosinophils Relative: 5.4 % — ABNORMAL HIGH (ref 0.0–5.0)
HCT: 43.7 % (ref 39.0–52.0)
Hemoglobin: 15.1 g/dL (ref 13.0–17.0)
Lymphocytes Relative: 24.3 % (ref 12.0–46.0)
Lymphs Abs: 1.9 10*3/uL (ref 0.7–4.0)
MCHC: 34.5 g/dL (ref 30.0–36.0)
MCV: 90 fl (ref 78.0–100.0)
Monocytes Absolute: 0.7 10*3/uL (ref 0.1–1.0)
Monocytes Relative: 8.4 % (ref 3.0–12.0)
Neutro Abs: 4.9 10*3/uL (ref 1.4–7.7)
Neutrophils Relative %: 61.3 % (ref 43.0–77.0)
Platelets: 211 10*3/uL (ref 150.0–400.0)
RBC: 4.85 Mil/uL (ref 4.22–5.81)
RDW: 13.2 % (ref 11.5–15.5)
WBC: 7.9 10*3/uL (ref 4.0–10.5)

## 2022-02-21 LAB — HEMOGLOBIN A1C: Hgb A1c MFr Bld: 6.7 % — ABNORMAL HIGH (ref 4.6–6.5)

## 2022-02-23 ENCOUNTER — Encounter: Payer: Self-pay | Admitting: Internal Medicine

## 2022-03-06 ENCOUNTER — Ambulatory Visit: Payer: Federal, State, Local not specified - PPO | Admitting: Internal Medicine

## 2022-04-28 ENCOUNTER — Other Ambulatory Visit: Payer: Self-pay | Admitting: Internal Medicine

## 2022-04-28 DIAGNOSIS — N5201 Erectile dysfunction due to arterial insufficiency: Secondary | ICD-10-CM

## 2022-05-05 DIAGNOSIS — K080 Exfoliation of teeth due to systemic causes: Secondary | ICD-10-CM | POA: Diagnosis not present

## 2022-05-05 DIAGNOSIS — G4733 Obstructive sleep apnea (adult) (pediatric): Secondary | ICD-10-CM | POA: Diagnosis not present

## 2022-05-16 DIAGNOSIS — M545 Low back pain, unspecified: Secondary | ICD-10-CM | POA: Diagnosis not present

## 2022-05-21 DIAGNOSIS — G4733 Obstructive sleep apnea (adult) (pediatric): Secondary | ICD-10-CM | POA: Diagnosis not present

## 2022-05-31 DIAGNOSIS — M5416 Radiculopathy, lumbar region: Secondary | ICD-10-CM | POA: Diagnosis not present

## 2022-06-04 LAB — HM DIABETES EYE EXAM

## 2022-06-06 ENCOUNTER — Encounter: Payer: Self-pay | Admitting: Internal Medicine

## 2022-06-06 DIAGNOSIS — M545 Low back pain, unspecified: Secondary | ICD-10-CM | POA: Diagnosis not present

## 2022-06-09 ENCOUNTER — Other Ambulatory Visit: Payer: Self-pay | Admitting: Orthopedic Surgery

## 2022-06-09 DIAGNOSIS — R1032 Left lower quadrant pain: Secondary | ICD-10-CM

## 2022-06-10 ENCOUNTER — Ambulatory Visit: Payer: Federal, State, Local not specified - PPO | Admitting: Internal Medicine

## 2022-06-13 DIAGNOSIS — G4733 Obstructive sleep apnea (adult) (pediatric): Secondary | ICD-10-CM | POA: Diagnosis not present

## 2022-06-24 DIAGNOSIS — M5416 Radiculopathy, lumbar region: Secondary | ICD-10-CM | POA: Diagnosis not present

## 2022-07-01 ENCOUNTER — Ambulatory Visit
Admission: RE | Admit: 2022-07-01 | Discharge: 2022-07-01 | Disposition: A | Discharge status: Home / Self Care | Payer: Federal, State, Local not specified - PPO | Source: Ambulatory Visit | Attending: Orthopedic Surgery | Admitting: Orthopedic Surgery

## 2022-07-01 DIAGNOSIS — K5730 Diverticulosis of large intestine without perforation or abscess without bleeding: Secondary | ICD-10-CM | POA: Diagnosis not present

## 2022-07-01 DIAGNOSIS — Z9889 Other specified postprocedural states: Secondary | ICD-10-CM | POA: Diagnosis not present

## 2022-07-01 DIAGNOSIS — R1032 Left lower quadrant pain: Secondary | ICD-10-CM

## 2022-07-01 DIAGNOSIS — D3502 Benign neoplasm of left adrenal gland: Secondary | ICD-10-CM | POA: Diagnosis not present

## 2022-07-02 ENCOUNTER — Ambulatory Visit: Payer: Federal, State, Local not specified - PPO | Admitting: Internal Medicine

## 2022-07-05 ENCOUNTER — Other Ambulatory Visit: Payer: Self-pay | Admitting: Internal Medicine

## 2022-07-05 DIAGNOSIS — E118 Type 2 diabetes mellitus with unspecified complications: Secondary | ICD-10-CM

## 2022-07-05 DIAGNOSIS — R0781 Pleurodynia: Secondary | ICD-10-CM | POA: Diagnosis not present

## 2022-07-05 DIAGNOSIS — S2231XA Fracture of one rib, right side, initial encounter for closed fracture: Secondary | ICD-10-CM | POA: Diagnosis not present

## 2022-07-14 DIAGNOSIS — G4733 Obstructive sleep apnea (adult) (pediatric): Secondary | ICD-10-CM | POA: Diagnosis not present

## 2022-07-22 DIAGNOSIS — M545 Low back pain, unspecified: Secondary | ICD-10-CM | POA: Diagnosis not present

## 2022-07-22 DIAGNOSIS — R0781 Pleurodynia: Secondary | ICD-10-CM | POA: Diagnosis not present

## 2022-08-13 DIAGNOSIS — G4733 Obstructive sleep apnea (adult) (pediatric): Secondary | ICD-10-CM | POA: Diagnosis not present

## 2022-08-21 DIAGNOSIS — E119 Type 2 diabetes mellitus without complications: Secondary | ICD-10-CM | POA: Diagnosis not present

## 2022-08-21 DIAGNOSIS — G4733 Obstructive sleep apnea (adult) (pediatric): Secondary | ICD-10-CM | POA: Diagnosis not present

## 2022-09-13 DIAGNOSIS — G4733 Obstructive sleep apnea (adult) (pediatric): Secondary | ICD-10-CM | POA: Diagnosis not present

## 2022-10-07 DIAGNOSIS — G4733 Obstructive sleep apnea (adult) (pediatric): Secondary | ICD-10-CM | POA: Diagnosis not present

## 2022-11-15 ENCOUNTER — Other Ambulatory Visit: Payer: Self-pay | Admitting: Internal Medicine

## 2022-11-15 DIAGNOSIS — E118 Type 2 diabetes mellitus with unspecified complications: Secondary | ICD-10-CM

## 2022-11-25 ENCOUNTER — Encounter: Payer: Self-pay | Admitting: Internal Medicine

## 2022-11-25 ENCOUNTER — Ambulatory Visit: Payer: Federal, State, Local not specified - PPO | Admitting: Internal Medicine

## 2022-11-25 VITALS — BP 112/70 | HR 68 | Temp 97.6°F | Ht 66.0 in | Wt 188.0 lb

## 2022-11-25 DIAGNOSIS — N138 Other obstructive and reflux uropathy: Secondary | ICD-10-CM | POA: Diagnosis not present

## 2022-11-25 DIAGNOSIS — Z0001 Encounter for general adult medical examination with abnormal findings: Secondary | ICD-10-CM

## 2022-11-25 DIAGNOSIS — E118 Type 2 diabetes mellitus with unspecified complications: Secondary | ICD-10-CM | POA: Diagnosis not present

## 2022-11-25 DIAGNOSIS — N401 Enlarged prostate with lower urinary tract symptoms: Secondary | ICD-10-CM

## 2022-11-25 DIAGNOSIS — R972 Elevated prostate specific antigen [PSA]: Secondary | ICD-10-CM

## 2022-11-25 DIAGNOSIS — K5904 Chronic idiopathic constipation: Secondary | ICD-10-CM

## 2022-11-25 DIAGNOSIS — E785 Hyperlipidemia, unspecified: Secondary | ICD-10-CM | POA: Diagnosis not present

## 2022-11-25 LAB — BASIC METABOLIC PANEL
BUN: 21 mg/dL (ref 6–23)
CO2: 30 mEq/L (ref 19–32)
Calcium: 9.4 mg/dL (ref 8.4–10.5)
Chloride: 101 mEq/L (ref 96–112)
Creatinine, Ser: 0.96 mg/dL (ref 0.40–1.50)
GFR: 86.94 mL/min (ref >60.00)
Glucose, Bld: 111 mg/dL — ABNORMAL HIGH (ref 70–99)
Potassium: 4.9 mEq/L (ref 3.5–5.1)
Sodium: 138 mEq/L (ref 135–145)

## 2022-11-25 LAB — MICROALBUMIN / CREATININE URINE RATIO
Creatinine,U: 55.5 mg/dL
Microalb Creat Ratio: 1.3 mg/g (ref 0.0–30.0)
Microalb, Ur: <0.7 mg/dL (ref 0.0–1.9)

## 2022-11-25 LAB — HEMOGLOBIN A1C: Hgb A1c MFr Bld: 6.8 % — ABNORMAL HIGH (ref 4.6–6.5)

## 2022-11-25 LAB — TSH: TSH: 0.58 u[IU]/mL (ref 0.35–5.50)

## 2022-11-25 LAB — PSA: PSA: 5.77 ng/mL — ABNORMAL HIGH (ref 0.10–4.00)

## 2022-11-25 NOTE — Patient Instructions (Signed)
Health Maintenance, Male Adopting a healthy lifestyle and getting preventive care are important in promoting health and wellness. Ask your health care provider about: The right schedule for you to have regular tests and exams. Things you can do on your own to prevent diseases and keep yourself healthy. What should I know about diet, weight, and exercise? Eat a healthy diet  Eat a diet that includes plenty of vegetables, fruits, low-fat dairy products, and lean protein. Do not eat a lot of foods that are high in solid fats, added sugars, or sodium. Maintain a healthy weight Body mass index (BMI) is a measurement that can be used to identify possible weight problems. It estimates body fat based on height and weight. Your health care provider can help determine your BMI and help you achieve or maintain a healthy weight. Get regular exercise Get regular exercise. This is one of the most important things you can do for your health. Most adults should: Exercise for at least 150 minutes each week. The exercise should increase your heart rate and make you sweat (moderate-intensity exercise). Do strengthening exercises at least twice a week. This is in addition to the moderate-intensity exercise. Spend less time sitting. Even light physical activity can be beneficial. Watch cholesterol and blood lipids Have your blood tested for lipids and cholesterol at 59 years of age, then have this test every 5 years. You may need to have your cholesterol levels checked more often if: Your lipid or cholesterol levels are high. You are older than 59 years of age. You are at high risk for heart disease. What should I know about cancer screening? Many types of cancers can be detected early and may often be prevented. Depending on your health history and family history, you may need to have cancer screening at various ages. This may include screening for: Colorectal cancer. Prostate cancer. Skin cancer. Lung  cancer. What should I know about heart disease, diabetes, and high blood pressure? Blood pressure and heart disease High blood pressure causes heart disease and increases the risk of stroke. This is more likely to develop in people who have high blood pressure readings or are overweight. Talk with your health care provider about your target blood pressure readings. Have your blood pressure checked: Every 3-5 years if you are 18-39 years of age. Every year if you are 40 years old or older. If you are between the ages of 65 and 75 and are a current or former smoker, ask your health care provider if you should have a one-time screening for abdominal aortic aneurysm (AAA). Diabetes Have regular diabetes screenings. This checks your fasting blood sugar level. Have the screening done: Once every three years after age 45 if you are at a normal weight and have a low risk for diabetes. More often and at a younger age if you are overweight or have a high risk for diabetes. What should I know about preventing infection? Hepatitis B If you have a higher risk for hepatitis B, you should be screened for this virus. Talk with your health care provider to find out if you are at risk for hepatitis B infection. Hepatitis C Blood testing is recommended for: Everyone born from 1945 through 1965. Anyone with known risk factors for hepatitis C. Sexually transmitted infections (STIs) You should be screened each year for STIs, including gonorrhea and chlamydia, if: You are sexually active and are younger than 59 years of age. You are older than 59 years of age and your   health care provider tells you that you are at risk for this type of infection. Your sexual activity has changed since you were last screened, and you are at increased risk for chlamydia or gonorrhea. Ask your health care provider if you are at risk. Ask your health care provider about whether you are at high risk for HIV. Your health care provider  may recommend a prescription medicine to help prevent HIV infection. If you choose to take medicine to prevent HIV, you should first get tested for HIV. You should then be tested every 3 months for as long as you are taking the medicine. Follow these instructions at home: Alcohol use Do not drink alcohol if your health care provider tells you not to drink. If you drink alcohol: Limit how much you have to 0-2 drinks a day. Know how much alcohol is in your drink. In the [place] one drink equals one 12 oz bottle of beer (355 mL), one 5 oz glass of wine (148 mL), or one 1 oz glass of hard liquor (44 mL). Lifestyle Do not use any products that contain nicotine or tobacco. These products include cigarettes, chewing tobacco, and vaping devices, such as e-cigarettes. If you need help quitting, ask your health care provider. Do not use street drugs. Do not share needles. Ask your health care provider for help if you need support or information about quitting drugs. General instructions Schedule regular health, dental, and eye exams. Stay current with your vaccines. Tell your health care provider if: You often feel depressed. You have ever been abused or do not feel safe at home. Summary Adopting a healthy lifestyle and getting preventive care are important in promoting health and wellness. Follow your health care provider's instructions about healthy diet, exercising, and getting tested or screened for diseases. Follow your health care provider's instructions on monitoring your cholesterol and blood pressure. This information is not intended to replace advice given to you by your health care provider. Make sure you discuss any questions you have with your health care provider. Document Revised: 12/24/2020 Document Reviewed: 12/24/2020 Elsevier Patient Education  2023 [employer]  

## 2022-11-25 NOTE — Progress Notes (Signed)
Subjective:  Patient ID: Nathan Park, male    DOB: Oct 09, 1965  Age: 57 y.o. MRN: [id number]  CC: Annual Exam, Hyperlipidemia, and Diabetes   HPI Nathan Park presents for a CPX and f/up ---  He is active and denies chest pain, shortness of breath, diaphoresis, or edema.  He stopped taking Crestor because it caused joint aches.  Outpatient Medications Prior to Visit  Medication Sig Dispense Refill   glucose blood (ONE TOUCH ULTRA TEST) test strip Use as instructed 100 each 12   omega-3 acid ethyl esters (LOVAZA) 1 g capsule TAKE 2 CAPSULES (2 G TOTAL) BY MOUTH 2 (TWO) TIMES DAILY. 360 capsule 1   ONETOUCH DELICA LANCETS 33G MISC Test up to BID. DX: E11.9 200 each 4   Rimegepant Sulfate (NURTEC) 75 MG TBDP Take 1 tablet by mouth every other day. 46 tablet 1   tadalafil (CIALIS) 20 MG tablet TAKE ONE TABLET BY MOUTH DAILY AS NEEDED FOR ERECTILE DYSFUNCTION 7 tablet 2   rosuvastatin (CRESTOR) 5 MG tablet Take 1 tablet (5 mg total) by mouth daily. 90 tablet 1   Semaglutide, 1 MG/DOSE, (OZEMPIC, 1 MG/DOSE,) 4 MG/3ML SOPN INJECT 1MG  INTO THE SKIN ONCE A WEEK 9 mL 0   No facility-administered medications prior to visit.    ROS Review of Systems  Constitutional: Negative.  Negative for appetite change, diaphoresis, fatigue and unexpected weight change.  HENT: Negative.    Eyes: Negative.   Respiratory:  Negative for cough, chest tightness, shortness of breath and wheezing.   Cardiovascular:  Negative for chest pain, palpitations and leg swelling.  Gastrointestinal:  Positive for constipation. Negative for abdominal pain, diarrhea, nausea and vomiting.  Endocrine: Negative.   Genitourinary: Negative.  Negative for difficulty urinating, dysuria and flank pain.  Musculoskeletal:  Positive for arthralgias. Negative for joint swelling and myalgias.  Skin: Negative.   Neurological: Negative.  Negative for dizziness, weakness, light-headedness and headaches.  Hematological:  Negative for  adenopathy. Does not bruise/bleed easily.    Objective:  BP 112/70 (BP Location: Left Arm, Patient Position: Sitting, Cuff Size: Large)   Pulse 68   Temp 97.6 F (36.4 C) (Oral)   Ht 5\' 6"  (1.676 m)   Wt 188 lb (85.3 kg)   SpO2 96%   BMI 30.34 kg/m   BP Readings from Last 3 Encounters:  11/25/22 112/70  02/20/22 130/82  12/16/21 124/78    Wt Readings from Last 3 Encounters:  11/25/22 188 lb (85.3 kg)  02/20/22 184 lb (83.5 kg)  12/16/21 182 lb (82.6 kg)    Physical Exam Vitals reviewed.  HENT:     Nose: Nose normal.     Mouth/Throat:     Mouth: Mucous membranes are moist.  Eyes:     General: No scleral icterus.    Conjunctiva/sclera: Conjunctivae normal.  Cardiovascular:     Rate and Rhythm: Normal rate and regular rhythm.     Heart sounds: No murmur heard.    No gallop.  Pulmonary:     Effort: Pulmonary effort is normal.     Breath sounds: No stridor. No wheezing, rhonchi or rales.  Abdominal:     Palpations: There is no mass.     Tenderness: There is no abdominal tenderness. There is no guarding.     Hernia: No hernia is present.  Musculoskeletal:        General: Normal range of motion.     Cervical back: Neck supple.     Right lower  leg: No edema.     Left lower leg: No edema.  Lymphadenopathy:     Cervical: No cervical adenopathy.  Skin:    General: Skin is warm and dry.  Neurological:     General: No focal deficit present.     Mental Status: He is alert. Mental status is at baseline.  Psychiatric:        Mood and Affect: Mood normal.        Behavior: Behavior normal.     Lab Results  Component Value Date   WBC 7.9 02/20/2022   HGB 15.1 02/20/2022   HCT 43.7 02/20/2022   PLT 211.0 02/20/2022   GLUCOSE 111 (H) 11/25/2022   CHOL 155 09/12/2021   TRIG 69.0 09/12/2021   HDL 51.60 09/12/2021   LDLDIRECT 54.0 03/10/2019   LDLCALC 89 09/12/2021   ALT 20 02/20/2022   AST 15 02/20/2022   NA 138 11/25/2022   K 4.9 11/25/2022   CL 101  11/25/2022   CREATININE 0.96 11/25/2022   BUN 21 11/25/2022   CO2 30 11/25/2022   TSH 0.58 11/25/2022   PSA 5.77 (H) 11/25/2022   INR 1.08 03/25/2010   HGBA1C 6.8 (H) 11/25/2022   MICROALBUR <0.7 11/25/2022    CT ABDOMEN WO CONTRAST  Result Date: 07/03/2022 CLINICAL DATA:  Left flank pain for 2-3 months. Question renal mass. EXAM: CT ABDOMEN WITHOUT CONTRAST TECHNIQUE: Multidetector CT imaging of the abdomen was performed following the standard protocol without IV contrast. RADIATION DOSE REDUCTION: This exam was performed according to the departmental dose-optimization program which includes automated exposure control, adjustment of the mA and/or kV according to patient size and/or use of iterative reconstruction technique. COMPARISON:  None Available. FINDINGS: Lower chest: The lung bases demonstrate mild dependent atelectasis. No pleural or pericardial effusion. Hepatobiliary: No focal liver abnormality is seen. No gallstones, gallbladder wall thickening, or biliary dilatation. Pancreas: Unremarkable. No pancreatic ductal dilatation or surrounding inflammatory changes. Spleen: Normal in size without focal abnormality. Adrenals/Urinary Tract: Small left adrenal adenoma is noted. The right adrenal gland appears normal. Although lack of IV contrast somewhat limits evaluation for solid organ lesions, no renal mass is identified. No stones or hydronephrosis. Visualized ureters appear normal. Stomach/Bowel: The patient is status post Nissen fundoplication. Small diverticulum the descending duodenum is noted. No evidence of bowel obstruction or inflammatory change. Visualized colon demonstrates a few diverticula. Vascular/Lymphatic: No significant vascular findings are present. No enlarged abdominal or pelvic lymph nodes. Other: None. Musculoskeletal: No acute focal abnormality. IMPRESSION: No acute abnormality or finding to explain the patient's symptoms. IV contrast somewhat limits for evaluation of  solid organ structures but no renal mass is seen. Small, benign left adrenal adenoma. No follow-up imaging recommended. Electronically Signed   By: Drusilla Kannerhomas  Dalessio M.D.   On: 07/03/2022 08:09    Assessment & Plan:   BPH with obstruction/lower urinary tract symptoms -     PSA; Future -     Urinalysis, Routine w reflex microscopic; Future  Chronic idiopathic constipation- labs are negative for secondary causes. -     TSH; Future  PSA elevation -     PSA; Future -     Urinalysis, Routine w reflex microscopic; Future  Hyperlipidemia LDL goal <70- Will try a different statin. -     Pitavastatin Calcium; Take 1 tablet (1 mg total) by mouth daily.  Dispense: 90 tablet; Refill: 1  Type 2 diabetes mellitus with complication, without long-term current use of insulin- His A1c is 6.8%.  Will continue semaglutide. -     Hemoglobin A1c; Future -     Basic metabolic panel; Future -     HM Diabetes Foot Exam -     Microalbumin / creatinine urine ratio; Future -     Urinalysis, Routine w reflex microscopic; Future -     Semaglutide (2 MG/DOSE); Inject 2 mg as directed once a week.  Dispense: 9 mL; Refill: 1  Rising PSA level -     Ambulatory referral to Urology  Type II diabetes mellitus with manifestations -     Semaglutide (2 MG/DOSE); Inject 2 mg as directed once a week.  Dispense: 9 mL; Refill: 1  Encounter for general adult medical examination with abnormal findings- Exam completed, labs reviewed, vaccines reviewed, cancer screenings are up-to-date, patient education was given.     Follow-up: Return in about 6 months (around 05/27/2023).  Sanda Lingerhomas Zoe Creasman, MD

## 2022-11-26 DIAGNOSIS — M5416 Radiculopathy, lumbar region: Secondary | ICD-10-CM | POA: Diagnosis not present

## 2022-11-26 DIAGNOSIS — R972 Elevated prostate specific antigen [PSA]: Secondary | ICD-10-CM | POA: Insufficient documentation

## 2022-11-26 LAB — URINALYSIS, ROUTINE W REFLEX MICROSCOPIC
Bilirubin Urine: NEGATIVE
Hgb urine dipstick: NEGATIVE
Ketones, ur: NEGATIVE
Leukocytes,Ua: NEGATIVE
Nitrite: NEGATIVE
RBC / HPF: NONE SEEN (ref 0–?)
Specific Gravity, Urine: 1.01 (ref 1.000–1.030)
Total Protein, Urine: NEGATIVE
Urine Glucose: NEGATIVE
Urobilinogen, UA: 0.2 (ref 0.0–1.0)
WBC, UA: NONE SEEN (ref 0–?)
pH: 7.5 (ref 5.0–8.0)

## 2022-11-26 MED ORDER — PITAVASTATIN CALCIUM 1 MG PO TABS
1.0000 mg | ORAL_TABLET | Freq: Every day | ORAL | 1 refills | Status: AC
Start: 2022-11-26 — End: ?

## 2022-11-26 MED ORDER — SEMAGLUTIDE (2 MG/DOSE) 8 MG/3ML ~~LOC~~ SOPN
2.0000 mg | PEN_INJECTOR | SUBCUTANEOUS | 1 refills | Status: AC
Start: 2022-11-26 — End: ?

## 2022-11-28 DIAGNOSIS — Z0001 Encounter for general adult medical examination with abnormal findings: Secondary | ICD-10-CM | POA: Insufficient documentation

## 2022-11-28 DIAGNOSIS — E118 Type 2 diabetes mellitus with unspecified complications: Secondary | ICD-10-CM | POA: Insufficient documentation

## 2022-12-03 ENCOUNTER — Ambulatory Visit: Payer: Federal, State, Local not specified - PPO | Admitting: Internal Medicine

## 2022-12-04 DIAGNOSIS — E119 Type 2 diabetes mellitus without complications: Secondary | ICD-10-CM | POA: Diagnosis not present

## 2022-12-04 DIAGNOSIS — G4733 Obstructive sleep apnea (adult) (pediatric): Secondary | ICD-10-CM | POA: Diagnosis not present

## 2022-12-11 ENCOUNTER — Encounter: Payer: Self-pay | Admitting: Urology

## 2022-12-11 ENCOUNTER — Ambulatory Visit: Payer: Federal, State, Local not specified - PPO | Admitting: Urology

## 2022-12-11 VITALS — BP 122/77 | HR 85 | Ht 66.0 in | Wt 185.0 lb

## 2022-12-11 DIAGNOSIS — R972 Elevated prostate specific antigen [PSA]: Secondary | ICD-10-CM | POA: Diagnosis not present

## 2022-12-11 NOTE — Addendum Note (Signed)
Addended by: Joline Maxcy on: 12/11/2022 03:05 PM   Modules accepted: Orders

## 2022-12-11 NOTE — Progress Notes (Addendum)
Assessment: 1. PSA elevation      Plan: Today I had a long discussion with the patient regarding the issues and controversies regarding prostate cancer early detection and his history of elevated and fluctuating PSA. We discussed options for further evaluation in detail today. Following our discussion we plan to proceed as follows-- EXO-DX urine test for assessment of risk Prostate MRI Patient will follow-up after the above for review of results and further recommendations.  Chief Complaint: ELEVATED PSA  History of Present Illness:  Nathan Park is a 57 y.o. male with a past medical history of type 2 diabetes, hyperlipidemia, chronic constipation who is seen in consultation from Etta Grandchild, MD for evaluation of elevated psa.  Patient has a several year history of elevated and fluctuating PSA-see PSA data below. There is no family history of prostate cancer. Patient reports mild LUTS with minimal bother. Patient does have erectile dysfunction which responds well to Cialis 20 mg as needed   PSA data: 10/2013  3.14 04/2016  4.0 12/2017  2.8 (14%) 02/2019  3.57 05/2020 6.12 07/2020 4.9 12/2020  4.21  08/2021  5.12 12/2021  3.02 11/2022  5.77 11/2022  exoDx 16.6 (above risk cut point)   Past Medical History:  Past Medical History:  Diagnosis Date   Barrett esophagus 01/31/10   at 32 cm   Complication of anesthesia    slow to wake up   Diabetes mellitus    Diverticulosis    GERD (gastroesophageal reflux disease)    Headache(784.0)    Hemorrhoids    Hiatal hernia    Hiatal hernia    Nasal congestion    Rectal pain    Refusal of blood transfusions as patient is Jehovah's Witness    Umbilical hernia     Past Surgical History:  Past Surgical History:  Procedure Laterality Date   CARPAL TUNNEL RELEASE     LAPAROSCOPIC NISSEN FUNDOPLICATION  08/25/2012   Procedure: LAPAROSCOPIC NISSEN FUNDOPLICATION;  Surgeon: Valarie Merino, MD;  Location: [facility];  Service: General;   Laterality: N/A;   ORIF DISTAL RADIUS FRACTURE  2009   repair umblical hernia repair  04/01/10   sigmoid colectomy  04/01/10   take down of colovesical fistula  04/01/10   takedown of enterovesical fistula  04/01/10   WRIST SURGERY     left    Allergies:  Allergies  Allergen Reactions   Morphine And Related     "Does not work for me"   Oxycodone Hcl Itching and Nausea Only    Family History:  Family History  Problem Relation Age of Onset   Diabetes Father    Colon cancer Neg Hx    Cancer Neg Hx    Heart disease Neg Hx    Hypertension Neg Hx    Kidney disease Neg Hx     Social History:  Social History   Tobacco Use   Smoking status: Never   Smokeless tobacco: Never  Substance Use Topics   Alcohol use: Yes    Alcohol/week: 5.0 standard drinks of alcohol    Types: 3 Glasses of wine, 2 Shots of liquor per week    Comment: 2 drinks a week    Drug use: No    Review of symptoms:  Constitutional:  Negative for unexplained weight loss, night sweats, fever, chills ENT:  Negative for nose bleeds, sinus pain, painful swallowing CV:  Negative for chest pain, shortness of breath, exercise intolerance, palpitations, loss of consciousness Resp:  Negative for cough, wheezing, shortness of breath GI:  Negative for nausea, vomiting, diarrhea, bloody stools GU:  Positives noted in HPI; otherwise negative for gross hematuria, dysuria, urinary incontinence Neuro:  Negative for seizures, poor balance, limb weakness, slurred speech Psych:  Negative for lack of energy, depression, anxiety Endocrine:  Negative for polydipsia, polyuria, symptoms of hypoglycemia (dizziness, hunger, sweating) Hematologic:  Negative for anemia, purpura, petechia, prolonged or excessive bleeding, use of anticoagulants  Allergic:  Negative for difficulty breathing or choking as a result of exposure to anything; no shellfish allergy; no allergic response (rash/itch) to materials, foods  Physical exam: There were  no vitals taken for this visit. GENERAL APPEARANCE:  Well appearing, well developed, well nourished, NAD HEENT: Atraumatic, Normocephalic ABDOMEN: Soft, non-tender, No Masses.  Well healed LMS  GU: Normal phallus, testes and cords.  No evidence of hernia DRE: Normal sphincter tone; prostate is approximately 40 g without evidence of nodules or induration

## 2022-12-12 LAB — MICROSCOPIC EXAMINATION
Cast Type: NONE SEEN
Casts: NONE SEEN /lpf
Epithelial Cells (non renal): NONE SEEN /hpf (ref 0–10)
Renal Epithel, UA: NONE SEEN /hpf
Trichomonas, UA: NONE SEEN
Yeast, UA: NONE SEEN

## 2022-12-12 LAB — URINALYSIS, ROUTINE W REFLEX MICROSCOPIC
Bilirubin, UA: NEGATIVE
Leukocytes,UA: NEGATIVE
Nitrite, UA: NEGATIVE
Protein,UA: NEGATIVE
Specific Gravity, UA: 1.025 (ref 1.005–1.030)
Urobilinogen, Ur: 0.2 mg/dL (ref 0.2–1.0)
pH, UA: 6.5 (ref 5.0–7.5)

## 2022-12-18 ENCOUNTER — Encounter: Payer: Self-pay | Admitting: Urology

## 2023-01-09 DIAGNOSIS — G4733 Obstructive sleep apnea (adult) (pediatric): Secondary | ICD-10-CM | POA: Diagnosis not present

## 2023-01-22 ENCOUNTER — Other Ambulatory Visit: Payer: Self-pay | Admitting: Internal Medicine

## 2023-01-22 ENCOUNTER — Ambulatory Visit
Admission: RE | Admit: 2023-01-22 | Discharge: 2023-01-22 | Disposition: A | Discharge status: Home / Self Care | Payer: Federal, State, Local not specified - PPO | Source: Ambulatory Visit | Attending: Urology | Admitting: Urology

## 2023-01-22 DIAGNOSIS — N5201 Erectile dysfunction due to arterial insufficiency: Secondary | ICD-10-CM

## 2023-01-22 DIAGNOSIS — R972 Elevated prostate specific antigen [PSA]: Secondary | ICD-10-CM | POA: Diagnosis not present

## 2023-01-22 MED ORDER — GADOPICLENOL 0.5 MMOL/ML IV SOLN
8.0000 mL | Freq: Once | INTRAVENOUS | Status: AC | PRN
  Administered 2023-01-22: 8 mL via INTRAVENOUS

## 2023-01-28 ENCOUNTER — Encounter: Payer: Self-pay | Admitting: Urology

## 2023-01-28 ENCOUNTER — Ambulatory Visit: Payer: Federal, State, Local not specified - PPO | Admitting: Urology

## 2023-01-28 VITALS — BP 114/75 | HR 66 | Ht 66.0 in | Wt 175.0 lb

## 2023-01-28 DIAGNOSIS — R972 Elevated prostate specific antigen [PSA]: Secondary | ICD-10-CM

## 2023-01-28 NOTE — Patient Instructions (Signed)
Prostate Biopsy Instructions  Stop all aspirin or blood thinners (aspirin, plavix, coumadin, warfarin, motrin, ibuprofen, advil, aleve, naproxen, naprosyn) for 7 days prior to the procedure.  If you have any questions about stopping these medications, please contact your primary care physician or cardiologist.  Having a light meal prior to the procedure is recommended.  If you are diabetic or have low blood sugar please bring a small snack or glucose tablet.  A Fleets enema is needed and can be purchased over the counter at a local pharmacy. This will need to be administered 2 hours prior to your procedure. Antibiotics will be administered in the clinic at the time of the procedure unless otherwise specified.    If you have any questions or concerns, please feel free to call the office at [phone] or send a Mychart message.   Thank you,  Staff at [facility]  

## 2023-01-28 NOTE — Progress Notes (Signed)
Assessment: 1. PSA elevation     Plan: Today I had a long and detailed discussion with the patient and his wife regarding his elevated PSA and the results of his recent testing including multiparametric prostate MRI.  We discussed in detail further evaluation including transrectal ultrasound and prostate biopsy.  Nature procedure reviewed in detail including potential adverse events and complications.  Patient agrees to proceed.  Will schedule next available.  Total time providing care during visit today = 33 min  Chief Complaint: Elevated psa  HPI: Nathan Park is a 57 y.o. male who presents for continued evaluation of elevated psa. Today he is accompanied by his wife. He is doing well.  No gu complaints. PSA DATA: 10/2013             3.14 04/2016             4.0 12/2017             2.8 (14%) 02/2019             3.57 05/2020           6.12 07/2020           4.9 12/2020             4.21      08/2021             5.12 12/2021             3.02 11/2022             5.77 11/2022             exoDx 16.6 (above risk cut point) 01/2023  Mp-prostate mri without intermed or high grade lesions.  Vol= 58 Incidental finding cystic area above bladder.  Patient is s/p sigmoid colectomy and repair of colovesical fistula partial c7stectomy suggesting post op changes.     Portions of the above documentation were copied from a prior visit for review purposes only.  Allergies: Allergies  Allergen Reactions   Morphine And Codeine     "Does not work for me"   Oxycodone Hcl Itching and Nausea Only    PMH: Past Medical History:  Diagnosis Date   Barrett esophagus 01/31/10   at 32 cm   Complication of anesthesia    slow to wake up   Diabetes mellitus    Diverticulosis    GERD (gastroesophageal reflux disease)    Headache(784.0)    Hemorrhoids    Hiatal hernia    Hiatal hernia    Nasal congestion    Rectal pain    Refusal of blood transfusions as patient is Jehovah's Witness    Umbilical  hernia     PSH: Past Surgical History:  Procedure Laterality Date   CARPAL TUNNEL RELEASE     LAPAROSCOPIC NISSEN FUNDOPLICATION  08/25/2012   Procedure: LAPAROSCOPIC NISSEN FUNDOPLICATION;  Surgeon: Valarie Merino, MD;  Location: [facility];  Service: General;  Laterality: N/A;   ORIF DISTAL RADIUS FRACTURE  2009   repair umblical hernia repair  04/01/10   sigmoid colectomy  04/01/10   take down of colovesical fistula  04/01/10   takedown of enterovesical fistula  04/01/10   WRIST SURGERY     left    SH: Social History   Tobacco Use   Smoking status: Never   Smokeless tobacco: Never  Substance Use Topics   Alcohol use: Yes    Alcohol/week: 5.0 standard drinks of alcohol    Types:  3 Glasses of wine, 2 Shots of liquor per week    Comment: 2 drinks a week    Drug use: No    ROS: Constitutional:  Negative for fever, chills, weight loss CV: Negative for chest pain, previous MI, hypertension Respiratory:  Negative for shortness of breath, wheezing, sleep apnea, frequent cough GI:  Negative for nausea, vomiting, bloody stool, GERD  PE: There were no vitals taken for this visit. GENERAL APPEARANCE:  Well appearing, well developed, well nourished, NAD

## 2023-02-03 ENCOUNTER — Ambulatory Visit: Payer: Federal, State, Local not specified - PPO | Admitting: Urology

## 2023-02-04 LAB — MICROSCOPIC EXAMINATION
Bacteria, UA: NONE SEEN
Cast Type: NONE SEEN
Casts: NONE SEEN /lpf
Crystal Type: NONE SEEN
Crystals: NONE SEEN
Mucus, UA: NONE SEEN
Renal Epithel, UA: NONE SEEN /hpf
Trichomonas, UA: NONE SEEN
Yeast, UA: NONE SEEN

## 2023-02-04 LAB — URINALYSIS, ROUTINE W REFLEX MICROSCOPIC
Bilirubin, UA: NEGATIVE
Glucose, UA: NEGATIVE
Ketones, UA: NEGATIVE
Nitrite, UA: NEGATIVE
Protein,UA: NEGATIVE
Specific Gravity, UA: 1.025 (ref 1.005–1.030)
Urobilinogen, Ur: 0.2 mg/dL (ref 0.2–1.0)
pH, UA: 6.5 (ref 5.0–7.5)

## 2023-02-17 ENCOUNTER — Other Ambulatory Visit: Payer: Self-pay

## 2023-02-17 DIAGNOSIS — R972 Elevated prostate specific antigen [PSA]: Secondary | ICD-10-CM

## 2023-02-25 ENCOUNTER — Ambulatory Visit (INDEPENDENT_AMBULATORY_CARE_PROVIDER_SITE_OTHER): Payer: Federal, State, Local not specified - PPO | Admitting: Urology

## 2023-02-25 ENCOUNTER — Ambulatory Visit (HOSPITAL_BASED_OUTPATIENT_CLINIC_OR_DEPARTMENT_OTHER)
Admission: RE | Admit: 2023-02-25 | Discharge: 2023-02-25 | Disposition: A | Discharge status: Home / Self Care | Payer: Federal, State, Local not specified - PPO | Source: Ambulatory Visit | Attending: Urology | Admitting: Urology

## 2023-02-25 VITALS — BP 111/76 | HR 71

## 2023-02-25 DIAGNOSIS — R972 Elevated prostate specific antigen [PSA]: Secondary | ICD-10-CM

## 2023-02-25 DIAGNOSIS — Z2989 Encounter for other specified prophylactic measures: Secondary | ICD-10-CM | POA: Diagnosis not present

## 2023-02-25 LAB — URINALYSIS, ROUTINE W REFLEX MICROSCOPIC
Bilirubin, UA: NEGATIVE
Glucose, UA: NEGATIVE
Ketones, UA: NEGATIVE
Leukocytes,UA: NEGATIVE
Nitrite, UA: NEGATIVE
Protein,UA: NEGATIVE
Specific Gravity, UA: 1.025 (ref 1.005–1.030)
Urobilinogen, Ur: 0.2 mg/dL (ref 0.2–1.0)
pH, UA: 6.5 (ref 5.0–7.5)

## 2023-02-25 LAB — MICROSCOPIC EXAMINATION
Bacteria, UA: NONE SEEN
Cast Type: NONE SEEN
Casts: NONE SEEN /lpf
Crystal Type: NONE SEEN
Crystals: NONE SEEN
Epithelial Cells (non renal): NONE SEEN /hpf (ref 0–10)
Renal Epithel, UA: NONE SEEN /hpf
Trichomonas, UA: NONE SEEN
Yeast, UA: NONE SEEN

## 2023-02-25 MED ORDER — CEFTRIAXONE SODIUM 1 G IJ SOLR
1.0000 g | Freq: Once | INTRAMUSCULAR | Status: AC
Start: 2023-02-25 — End: 2023-02-25
  Administered 2023-02-25: 1 g via INTRAMUSCULAR

## 2023-02-25 NOTE — Progress Notes (Signed)
IM Injection  Patient is present today for an IM Injection for treatment of infection prevention post prostate biopsy Drug: Ceftriaxone Dose:1g Location:[facility]Q Lot: [id number] Exp:10/2024 Patient tolerated well, no complications were noted  Performed by: Jolanda Mccann N., CMA(AAMA)    

## 2023-02-25 NOTE — Progress Notes (Signed)
Assessment: 1. PSA elevation     Plan: PER BX INSTRUCTIONS FU 1 WEEK TO REVIEW PATH AND FURTHER RECS BASED ON RESULTS  Chief Complaint: Her for prostate biopsy  HPI: Nathan Park is a 57 y.o. male who presents for continued evaluation of elevated PSA. Patient is here today for planned transrectal ultrasound prostate biopsy.  Please see my note 12/11/2022 at the time of initial visit for detailed history and subsequent follow-up 01/28/2023.  Briefly the patient has had an elevated and fluctuating PSA for several years. Most recent PSA 11/2022 was 5.77 and ExoDx returned 16.6 above risk cut point. Patient underwent prostate MRI which showed no high-grade lesions.  He is here today for planned transrectal ultrasound prostate biopsy.  Patient has received a standard prep as well as Rocephin 1 g IM  Portions of the above documentation were copied from a prior visit for review purposes only.  Allergies: Allergies  Allergen Reactions   Morphine And Codeine     "Does not work for me"   Oxycodone Hcl Itching and Nausea Only    PMH: Past Medical History:  Diagnosis Date   Barrett esophagus 01/31/10   at 32 cm   Complication of anesthesia    slow to wake up   Diabetes mellitus    Diverticulosis    GERD (gastroesophageal reflux disease)    Headache(784.0)    Hemorrhoids    Hiatal hernia    Hiatal hernia    Nasal congestion    Rectal pain    Refusal of blood transfusions as patient is Jehovah's Witness    Umbilical hernia     PSH: Past Surgical History:  Procedure Laterality Date   CARPAL TUNNEL RELEASE     LAPAROSCOPIC NISSEN FUNDOPLICATION  08/25/2012   Procedure: LAPAROSCOPIC NISSEN FUNDOPLICATION;  Surgeon: Valarie Merino, MD;  Location: [facility];  Service: General;  Laterality: N/A;   ORIF DISTAL RADIUS FRACTURE  2009   repair umblical hernia repair  04/01/10   sigmoid colectomy  04/01/10   take down of colovesical fistula  04/01/10   takedown of enterovesical fistula   04/01/10   WRIST SURGERY     left    SH: Social History   Tobacco Use   Smoking status: Never   Smokeless tobacco: Never  Substance Use Topics   Alcohol use: Yes    Alcohol/week: 5.0 standard drinks of alcohol    Types: 3 Glasses of wine, 2 Shots of liquor per week    Comment: 2 drinks a week    Drug use: No    ROS: Constitutional:  Negative for fever, chills, weight loss CV: Negative for chest pain, previous MI, hypertension Respiratory:  Negative for shortness of breath, wheezing, sleep apnea, frequent cough GI:  Negative for nausea, vomiting, bloody stool, GERD  PE: BP 111/76   Pulse 71  GENERAL APPEARANCE:  Well appearing, well developed, well nourished, NAD   PROCEDURE------- TRANSRECTAL ULTRASOUND AND PROSTATE BIOPSY  Indication:  Elevated PSA  Prophylactic antibiotic administration: Rocephin  All medications that could result in increased bleeding were discontinued within an appropriate period of the time of biopsy.  Risk including bleeding and infection were discussed.  Informed consent was obtained.  The patient was placed in the left lateral decubitus position.  DRE:  NST, PROSTATE 50gm NO N/I  PROCEDURE 1.  TRANSRECTAL ULTRASOUND OF THE PROSTATE  The 7 MHz transrectal probe was used to image the prostate.  Anal stenosis was not noted.  TRUS volume:  60 ml  Hypoechoic areas: None  Hyperechoic areas: None  Central calcifications: a few along surg capsule base  Margins:  normal  Seminal Vesicles: normal   PROCEDURE 2:  PROSTATE BIOPSY  A periprostatic block was performed using 1% lidocaine and transrectal ultrasound guidance. Under transrectal ultrasound guidance, and using the Biopty gun, prostate biopsies were obtained systematically from the apex, mid gland, and base bilaterally.  A total of 12 cores were obtained.  Hemostasis was obtained with gentle pressure on the prostate.  The procedures were well-tolerated.  No significant bleeding was  noted at the end of the procedure.  The patient was stable for discharge from the office.

## 2023-03-02 ENCOUNTER — Encounter: Payer: Self-pay | Admitting: Urology

## 2023-03-04 ENCOUNTER — Ambulatory Visit: Payer: Federal, State, Local not specified - PPO | Admitting: Urology

## 2023-03-04 ENCOUNTER — Encounter: Payer: Self-pay | Admitting: Urology

## 2023-03-04 VITALS — BP 122/78 | HR 72

## 2023-03-04 DIAGNOSIS — N401 Enlarged prostate with lower urinary tract symptoms: Secondary | ICD-10-CM | POA: Diagnosis not present

## 2023-03-04 DIAGNOSIS — R972 Elevated prostate specific antigen [PSA]: Secondary | ICD-10-CM | POA: Diagnosis not present

## 2023-03-04 LAB — URINALYSIS, ROUTINE W REFLEX MICROSCOPIC
Bilirubin, UA: NEGATIVE
Glucose, UA: NEGATIVE
Ketones, UA: NEGATIVE
Leukocytes,UA: NEGATIVE
Nitrite, UA: NEGATIVE
Protein,UA: NEGATIVE
Specific Gravity, UA: 1.015 (ref 1.005–1.030)
Urobilinogen, Ur: 0.2 mg/dL (ref 0.2–1.0)
pH, UA: 7 (ref 5.0–7.5)

## 2023-03-04 LAB — MICROSCOPIC EXAMINATION

## 2023-03-04 MED ORDER — FINASTERIDE 5 MG PO TABS
5.0000 mg | ORAL_TABLET | Freq: Every day | ORAL | 3 refills | Status: DC
Start: 2023-03-04 — End: 2023-09-17

## 2023-03-04 MED ORDER — TADALAFIL 5 MG PO TABS: 5.0000 mg | ORAL_TABLET | Freq: Every day | ORAL | 3 refills | Status: DC | PRN

## 2023-03-04 NOTE — Progress Notes (Signed)
   Assessment: 1. PSA elevation   2. Benign localized prostatic hyperplasia with lower urinary tract symptoms (LUTS)     Plan: Will dc on demand cialis Begin daily cialis 5mg  for bph and ED Begin finasteride 5mg  daily for BPH/enlarged gland  FU 6 mo with psa prior  Chief Complaint: Elevated PSA  HPI: Nathan Park is a 57 y.o. male who presents for continued evaluation of elevated psa and BPH. No complications at the result of his prostate biopsy. Patient continues to have significant lower urinary tract symptoms.  Current IPSS = 15  Prostate volume = 60 mL pathology = benign   Portions of the above documentation were copied from a prior visit for review purposes only.  Allergies: Allergies  Allergen Reactions   Morphine And Codeine     "Does not work for me"   Oxycodone Hcl Itching and Nausea Only    PMH: Past Medical History:  Diagnosis Date   Barrett esophagus 01/31/10   at 32 cm   Complication of anesthesia    slow to wake up   Diabetes mellitus    Diverticulosis    GERD (gastroesophageal reflux disease)    Headache(784.0)    Hemorrhoids    Hiatal hernia    Hiatal hernia    Nasal congestion    Rectal pain    Refusal of blood transfusions as patient is Jehovah's Witness    Umbilical hernia     PSH: Past Surgical History:  Procedure Laterality Date   CARPAL TUNNEL RELEASE     LAPAROSCOPIC NISSEN FUNDOPLICATION  08/25/2012   Procedure: LAPAROSCOPIC NISSEN FUNDOPLICATION;  Surgeon: Valarie Merino, MD;  Location: [facility];  Service: General;  Laterality: N/A;   ORIF DISTAL RADIUS FRACTURE  2009   repair umblical hernia repair  04/01/10   sigmoid colectomy  04/01/10   take down of colovesical fistula  04/01/10   takedown of enterovesical fistula  04/01/10   WRIST SURGERY     left    SH: Social History   Tobacco Use   Smoking status: Never   Smokeless tobacco: Never  Substance Use Topics   Alcohol use: Yes    Alcohol/week: 5.0 standard drinks of  alcohol    Types: 3 Glasses of wine, 2 Shots of liquor per week    Comment: 2 drinks a week    Drug use: No    ROS: Constitutional:  Negative for fever, chills, weight loss CV: Negative for chest pain, previous MI, hypertension Respiratory:  Negative for shortness of breath, wheezing, sleep apnea, frequent cough GI:  Negative for nausea, vomiting, bloody stool, GERD  PE: BP 122/78   Pulse 72  GENERAL APPEARANCE:  Well appearing, well developed, well nourished, NAD HEENT:  Atraumatic, normocephalic, oropharynx clear NECK:  Supple without lymphadenopathy or thyromegaly ABDOMEN:  Soft, non-tender, no masses EXTREMITIES:  Moves all extremities well, without clubbing, cyanosis, or edema NEUROLOGIC:  Alert and oriented x 3, normal gait, CN II-XII grossly intact MENTAL STATUS:  appropriate BACK:  Non-tender to palpation, No CVAT SKIN:  Warm, dry, and intact   Results: No results found for this or any previous visit (from the past 24 hour(s)).

## 2023-03-10 DIAGNOSIS — G4733 Obstructive sleep apnea (adult) (pediatric): Secondary | ICD-10-CM | POA: Diagnosis not present

## 2023-03-10 DIAGNOSIS — E119 Type 2 diabetes mellitus without complications: Secondary | ICD-10-CM | POA: Diagnosis not present

## 2023-03-27 ENCOUNTER — Other Ambulatory Visit: Payer: Self-pay | Admitting: Urology

## 2023-04-02 DIAGNOSIS — M5416 Radiculopathy, lumbar region: Secondary | ICD-10-CM | POA: Diagnosis not present

## 2023-05-12 ENCOUNTER — Other Ambulatory Visit: Payer: Self-pay | Admitting: Internal Medicine

## 2023-05-12 DIAGNOSIS — E118 Type 2 diabetes mellitus with unspecified complications: Secondary | ICD-10-CM

## 2023-05-15 ENCOUNTER — Ambulatory Visit: Payer: Federal, State, Local not specified - PPO | Admitting: Internal Medicine

## 2023-05-15 ENCOUNTER — Encounter: Payer: Self-pay | Admitting: Internal Medicine

## 2023-05-15 VITALS — BP 126/78 | HR 78 | Temp 98.3°F | Ht 66.0 in | Wt 181.0 lb

## 2023-05-15 DIAGNOSIS — Z7985 Long-term (current) use of injectable non-insulin antidiabetic drugs: Secondary | ICD-10-CM | POA: Diagnosis not present

## 2023-05-15 DIAGNOSIS — E118 Type 2 diabetes mellitus with unspecified complications: Secondary | ICD-10-CM

## 2023-05-15 DIAGNOSIS — K2270 Barrett's esophagus without dysplasia: Secondary | ICD-10-CM | POA: Diagnosis not present

## 2023-05-15 DIAGNOSIS — Z0000 Encounter for general adult medical examination without abnormal findings: Secondary | ICD-10-CM

## 2023-05-15 DIAGNOSIS — E785 Hyperlipidemia, unspecified: Secondary | ICD-10-CM

## 2023-05-15 DIAGNOSIS — Z23 Encounter for immunization: Secondary | ICD-10-CM | POA: Diagnosis not present

## 2023-05-15 DIAGNOSIS — Z0001 Encounter for general adult medical examination with abnormal findings: Secondary | ICD-10-CM

## 2023-05-15 LAB — CBC WITH DIFFERENTIAL/PLATELET
Basophils Absolute: 0 10*3/uL (ref 0.0–0.1)
Basophils Relative: 0.6 % (ref 0.0–3.0)
Eosinophils Absolute: 0.4 10*3/uL (ref 0.0–0.7)
Eosinophils Relative: 6.5 % — ABNORMAL HIGH (ref 0.0–5.0)
HCT: 45.4 % (ref 39.0–52.0)
Hemoglobin: 15.2 g/dL (ref 13.0–17.0)
Lymphocytes Relative: 26.1 % (ref 12.0–46.0)
Lymphs Abs: 1.5 10*3/uL (ref 0.7–4.0)
MCHC: 33.5 g/dL (ref 30.0–36.0)
MCV: 89.9 fL (ref 78.0–100.0)
Monocytes Absolute: 0.5 10*3/uL (ref 0.1–1.0)
Monocytes Relative: 9.2 % (ref 3.0–12.0)
Neutro Abs: 3.4 10*3/uL (ref 1.4–7.7)
Neutrophils Relative %: 57.6 % (ref 43.0–77.0)
Platelets: 213 10*3/uL (ref 150.0–400.0)
RBC: 5.05 Mil/uL (ref 4.22–5.81)
RDW: 13.1 % (ref 11.5–15.5)
WBC: 5.9 10*3/uL (ref 4.0–10.5)

## 2023-05-15 LAB — LIPID PANEL
Cholesterol: 128 mg/dL (ref 0–200)
HDL: 47.8 mg/dL (ref >39.00)
LDL Cholesterol: 57 mg/dL (ref 0–99)
NonHDL: 80.06
Total CHOL/HDL Ratio: 3
Triglycerides: 116 mg/dL (ref 0.0–149.0)
VLDL: 23.2 mg/dL (ref 0.0–40.0)

## 2023-05-15 LAB — HEPATIC FUNCTION PANEL
ALT: 19 U/L (ref 0–53)
AST: 18 U/L (ref 0–37)
Albumin: 4.4 g/dL (ref 3.5–5.2)
Alkaline Phosphatase: 47 U/L (ref 39–117)
Bilirubin, Direct: 0.1 mg/dL (ref 0.0–0.3)
Total Bilirubin: 0.6 mg/dL (ref 0.2–1.2)
Total Protein: 6.8 g/dL (ref 6.0–8.3)

## 2023-05-15 LAB — BASIC METABOLIC PANEL
BUN: 27 mg/dL — ABNORMAL HIGH (ref 6–23)
CO2: 29 meq/L (ref 19–32)
Calcium: 8.9 mg/dL (ref 8.4–10.5)
Chloride: 101 meq/L (ref 96–112)
Creatinine, Ser: 1.08 mg/dL (ref 0.40–1.50)
GFR: 75.23 mL/min (ref >60.00)
Glucose, Bld: 99 mg/dL (ref 70–99)
Potassium: 4.4 meq/L (ref 3.5–5.1)
Sodium: 138 meq/L (ref 135–145)

## 2023-05-15 LAB — HEMOGLOBIN A1C: Hgb A1c MFr Bld: 6.2 % (ref 4.6–6.5)

## 2023-05-15 LAB — TSH: TSH: 0.6 u[IU]/mL (ref 0.35–5.50)

## 2023-05-15 NOTE — Patient Instructions (Signed)
Health Maintenance, Male Adopting a healthy lifestyle and getting preventive care are important in promoting health and wellness. Ask your health care provider about: The right schedule for you to have regular tests and exams. Things you can do on your own to prevent diseases and keep yourself healthy. What should I know about diet, weight, and exercise? Eat a healthy diet  Eat a diet that includes plenty of vegetables, fruits, low-fat dairy products, and lean protein. Do not eat a lot of foods that are high in solid fats, added sugars, or sodium. Maintain a healthy weight Body mass index (BMI) is a measurement that can be used to identify possible weight problems. It estimates body fat based on height and weight. Your health care provider can help determine your BMI and help you achieve or maintain a healthy weight. Get regular exercise Get regular exercise. This is one of the most important things you can do for your health. Most adults should: Exercise for at least 150 minutes each week. The exercise should increase your heart rate and make you sweat (moderate-intensity exercise). Do strengthening exercises at least twice a week. This is in addition to the moderate-intensity exercise. Spend less time sitting. Even light physical activity can be beneficial. Watch cholesterol and blood lipids Have your blood tested for lipids and cholesterol at 59 years of age, then have this test every 5 years. You may need to have your cholesterol levels checked more often if: Your lipid or cholesterol levels are high. You are older than 59 years of age. You are at high risk for heart disease. What should I know about cancer screening? Many types of cancers can be detected early and may often be prevented. Depending on your health history and family history, you may need to have cancer screening at various ages. This may include screening for: Colorectal cancer. Prostate cancer. Skin cancer. Lung  cancer. What should I know about heart disease, diabetes, and high blood pressure? Blood pressure and heart disease High blood pressure causes heart disease and increases the risk of stroke. This is more likely to develop in people who have high blood pressure readings or are overweight. Talk with your health care provider about your target blood pressure readings. Have your blood pressure checked: Every 3-5 years if you are 18-39 years of age. Every year if you are 40 years old or older. If you are between the ages of 65 and 75 and are a current or former smoker, ask your health care provider if you should have a one-time screening for abdominal aortic aneurysm (AAA). Diabetes Have regular diabetes screenings. This checks your fasting blood sugar level. Have the screening done: Once every three years after age 45 if you are at a normal weight and have a low risk for diabetes. More often and at a younger age if you are overweight or have a high risk for diabetes. What should I know about preventing infection? Hepatitis B If you have a higher risk for hepatitis B, you should be screened for this virus. Talk with your health care provider to find out if you are at risk for hepatitis B infection. Hepatitis C Blood testing is recommended for: Everyone born from 1945 through 1965. Anyone with known risk factors for hepatitis C. Sexually transmitted infections (STIs) You should be screened each year for STIs, including gonorrhea and chlamydia, if: You are sexually active and are younger than 59 years of age. You are older than 59 years of age and your   health care provider tells you that you are at risk for this type of infection. Your sexual activity has changed since you were last screened, and you are at increased risk for chlamydia or gonorrhea. Ask your health care provider if you are at risk. Ask your health care provider about whether you are at high risk for HIV. Your health care provider  may recommend a prescription medicine to help prevent HIV infection. If you choose to take medicine to prevent HIV, you should first get tested for HIV. You should then be tested every 3 months for as long as you are taking the medicine. Follow these instructions at home: Alcohol use Do not drink alcohol if your health care provider tells you not to drink. If you drink alcohol: Limit how much you have to 0-2 drinks a day. Know how much alcohol is in your drink. In the [place] one drink equals one 12 oz bottle of beer (355 mL), one 5 oz glass of wine (148 mL), or one 1 oz glass of hard liquor (44 mL). Lifestyle Do not use any products that contain nicotine or tobacco. These products include cigarettes, chewing tobacco, and vaping devices, such as e-cigarettes. If you need help quitting, ask your health care provider. Do not use street drugs. Do not share needles. Ask your health care provider for help if you need support or information about quitting drugs. General instructions Schedule regular health, dental, and eye exams. Stay current with your vaccines. Tell your health care provider if: You often feel depressed. You have ever been abused or do not feel safe at home. Summary Adopting a healthy lifestyle and getting preventive care are important in promoting health and wellness. Follow your health care provider's instructions about healthy diet, exercising, and getting tested or screened for diseases. Follow your health care provider's instructions on monitoring your cholesterol and blood pressure. This information is not intended to replace advice given to you by your health care provider. Make sure you discuss any questions you have with your health care provider. Document Revised: 12/24/2020 Document Reviewed: 12/24/2020 Elsevier Patient Education  2024 Elsevier Inc.  

## 2023-05-15 NOTE — Progress Notes (Unsigned)
Subjective:  Patient ID: Nathan Park, male    DOB: 10/22/1965  Age: 57 y.o. MRN: [id number]  CC: Annual Exam, Hyperlipidemia, and Diabetes   HPI Nathan Park presents for a CPX and f/up ----  Discussed the use of AI scribe software for clinical note transcription with the patient, who gave verbal consent to proceed.  History of Present Illness   The patient, with a history of prostate concerns, recently underwent a prostate biopsy due to elevated PSA levels. The biopsy results were negative, indicating no signs of malignancy. He has been prescribed finasteride and Cialis, with no reported urinary symptoms. The patient is physically active, engaging in cross-country mountain biking, with no reported symptoms of chest pain, shortness of breath, or fatigue during exertion.  He recently initiated an intermittent fasting regimen, eating breakfast and lunch, then fasting until the next morning. He has been monitoring his blood glucose levels, noting a drop from around 150 to below 100 after an hour of walking. No symptoms of diabetes such as excessive thirst, urination, or drastic changes in weight or appetite were reported.  He consumes alcohol moderately, with a few glasses of wine or cans of beer a few days a week.       Outpatient Medications Prior to Visit  Medication Sig Dispense Refill   finasteride (PROSCAR) 5 MG tablet Take 1 tablet (5 mg total) by mouth daily. 90 tablet 3   glucose blood (ONE TOUCH ULTRA TEST) test strip Use as instructed 100 each 12   omega-3 acid ethyl esters (LOVAZA) 1 g capsule TAKE 2 CAPSULES (2 G TOTAL) BY MOUTH 2 (TWO) TIMES DAILY. 360 capsule 1   ONETOUCH DELICA LANCETS 33G MISC Test up to BID. DX: E11.9 200 each 4   tadalafil (CIALIS) 5 MG tablet TAKE 1 TABLET (5 MG TOTAL) BY MOUTH DAILY. 90 tablet 3   Pitavastatin Calcium 1 MG TABS Take 1 tablet (1 mg total) by mouth daily. 90 tablet 1   Semaglutide, 2 MG/DOSE, 8 MG/3ML SOPN Inject 2 mg as directed once a  week. 9 mL 1   No facility-administered medications prior to visit.    ROS Review of Systems  Constitutional:  Negative for appetite change, chills, diaphoresis, fatigue and fever.  HENT: Negative.    Eyes: Negative.   Respiratory: Negative.  Negative for cough, chest tightness, shortness of breath and wheezing.   Cardiovascular:  Negative for chest pain, palpitations and leg swelling.  Gastrointestinal:  Negative for abdominal pain, constipation, diarrhea, nausea and vomiting.  Endocrine: Negative.   Genitourinary: Negative.  Negative for difficulty urinating.  Musculoskeletal: Negative.  Negative for arthralgias.  Skin: Negative.   Neurological:  Negative for dizziness and weakness.  Hematological:  Negative for adenopathy. Does not bruise/bleed easily.  Psychiatric/Behavioral: Negative.      Objective:  BP 126/78 (BP Location: Right Arm, Patient Position: Sitting, Cuff Size: Large)   Pulse 78   Temp 98.3 F (36.8 C) (Oral)   Ht 5\' 6"  (1.676 m)   Wt 181 lb (82.1 kg)   SpO2 96%   BMI 29.21 kg/m   BP Readings from Last 3 Encounters:  05/15/23 126/78  03/04/23 122/78  02/25/23 111/76    Wt Readings from Last 3 Encounters:  05/15/23 181 lb (82.1 kg)  01/28/23 175 lb (79.4 kg)  12/11/22 185 lb (83.9 kg)    Physical Exam Vitals reviewed.  Constitutional:      Appearance: Normal appearance.  HENT:     Nose:  Nose normal.     Mouth/Throat:     Mouth: Mucous membranes are moist.  Eyes:     General: No scleral icterus.    Conjunctiva/sclera: Conjunctivae normal.  Cardiovascular:     Rate and Rhythm: Normal rate and regular rhythm.     Pulses: Normal pulses.     Heart sounds: No murmur heard.    No friction rub. No gallop.  Pulmonary:     Effort: Pulmonary effort is normal.     Breath sounds: No stridor. No wheezing, rhonchi or rales.  Abdominal:     General: Abdomen is flat.     Palpations: There is no mass.     Tenderness: There is no abdominal tenderness.  There is no guarding.     Hernia: No hernia is present.  Musculoskeletal:        General: Normal range of motion.     Cervical back: Neck supple.     Right lower leg: No edema.     Left lower leg: No edema.  Lymphadenopathy:     Cervical: No cervical adenopathy.  Skin:    General: Skin is warm and dry.  Neurological:     General: No focal deficit present.     Mental Status: He is alert.  Psychiatric:        Mood and Affect: Mood normal.        Behavior: Behavior normal.        Thought Content: Thought content normal.     Lab Results  Component Value Date   WBC 5.9 05/15/2023   HGB 15.2 05/15/2023   HCT 45.4 05/15/2023   PLT 213.0 05/15/2023   GLUCOSE 99 05/15/2023   CHOL 128 05/15/2023   TRIG 116.0 05/15/2023   HDL 47.80 05/15/2023   LDLDIRECT 54.0 03/10/2019   LDLCALC 57 05/15/2023   ALT 19 05/15/2023   AST 18 05/15/2023   NA 138 05/15/2023   K 4.4 05/15/2023   CL 101 05/15/2023   CREATININE 1.08 05/15/2023   BUN 27 (H) 05/15/2023   CO2 29 05/15/2023   TSH 0.60 05/15/2023   PSA 5.77 (H) 11/25/2022   INR 1.08 03/25/2010   HGBA1C 6.2 05/15/2023   MICROALBUR <0.7 11/25/2022    [place] Intraoperative  Result Date: 02/25/2023 CLINICAL DATA:  Ultrasound was provided for use by the ordering physician.  No provider Interpretation or professional fees incurred.    [place] PROSTATE BIOPSY MULTIPLE  Result Date: 02/25/2023 Please see Notes tab for imaging impression.  [place] Transrectal Complete  Result Date: 02/25/2023 Please see Notes tab for imaging impression.   Assessment & Plan:   Hyperlipidemia LDL goal <70 - LDL goal achieved. Doing well on the statin  -     Lipid panel; Future -     TSH; Future -     Hepatic function panel; Future -     Pitavastatin Calcium; Take 1 tablet (1 mg total) by mouth daily.  Dispense: 90 tablet; Refill: 1  Type 2 diabetes mellitus with complication, without long-term current use of insulin (HCC)- His blood sugar is well controlled. -      Hemoglobin A1c; Future -     Basic metabolic panel; Future -     HM DIABETES FOOT EXAM -     Semaglutide (2 MG/DOSE); Inject 2 mg as directed once a week.  Dispense: 9 mL; Refill: 1  Barrett's esophagus without dysplasia - H/H are normal. -     CBC with Differential/Platelet; Future  Flu vaccine  need -     Flu vaccine trivalent PF, 6mos and older(Flulaval,Afluria,Fluarix,Fluzone)  Encounter for general adult medical examination with abnormal findings - Exam completed, labs reviewed, vaccines reviewed and updated, cancer screenings are, pt ed material was given.      Follow-up: Return in about 6 months (around 11/12/2023).  Sanda Linger, MD

## 2023-05-16 MED ORDER — PITAVASTATIN CALCIUM 1 MG PO TABS: 1.0000 mg | ORAL_TABLET | Freq: Every day | ORAL | 1 refills | Status: DC

## 2023-05-16 MED ORDER — SEMAGLUTIDE (2 MG/DOSE) 8 MG/3ML ~~LOC~~ SOPN: 2.0000 mg | PEN_INJECTOR | SUBCUTANEOUS | 1 refills | Status: DC

## 2023-05-17 ENCOUNTER — Other Ambulatory Visit: Payer: Self-pay | Admitting: Internal Medicine

## 2023-05-17 DIAGNOSIS — E785 Hyperlipidemia, unspecified: Secondary | ICD-10-CM

## 2023-06-01 ENCOUNTER — Ambulatory Visit
Admission: RE | Admit: 2023-06-01 | Discharge: 2023-06-01 | Disposition: A | Discharge status: Home / Self Care | Payer: No Typology Code available for payment source | Source: Ambulatory Visit | Attending: Internal Medicine | Admitting: Internal Medicine

## 2023-06-01 ENCOUNTER — Other Ambulatory Visit: Payer: Federal, State, Local not specified - PPO

## 2023-06-01 DIAGNOSIS — E785 Hyperlipidemia, unspecified: Secondary | ICD-10-CM

## 2023-06-04 ENCOUNTER — Other Ambulatory Visit: Payer: Federal, State, Local not specified - PPO

## 2023-06-25 ENCOUNTER — Telehealth: Payer: Self-pay

## 2023-06-25 ENCOUNTER — Other Ambulatory Visit (HOSPITAL_COMMUNITY): Payer: Self-pay

## 2023-06-25 NOTE — Telephone Encounter (Signed)
Pharmacy Patient Advocate Encounter   Received notification from CoverMyMeds that prior authorization for Ozempic is required/requested.   Insurance verification completed.   The patient is insured through [facility] .   Per test claim: PA required; PA submitted to above mentioned insurance via CoverMyMeds Key/confirmation #/EOC Key: [facility] Status is pending

## 2023-06-29 NOTE — Telephone Encounter (Signed)
Pharmacy Patient Advocate Encounter  Received notification from [facility] that Prior Authorization for [facility] has been APPROVED from 05/26/23 to 06/24/24   PA #/Case ID/Reference #:  [id number]

## 2023-09-01 ENCOUNTER — Other Ambulatory Visit: Payer: Federal, State, Local not specified - PPO

## 2023-09-02 ENCOUNTER — Other Ambulatory Visit: Payer: Federal, State, Local not specified - PPO

## 2023-09-02 DIAGNOSIS — R972 Elevated prostate specific antigen [PSA]: Secondary | ICD-10-CM | POA: Diagnosis not present

## 2023-09-02 LAB — PSA: PSA: 2.8

## 2023-09-03 LAB — PSA: Prostate Specific Ag, Serum: 2.8 ng/mL (ref 0.0–4.0)

## 2023-09-08 ENCOUNTER — Ambulatory Visit: Payer: Federal, State, Local not specified - PPO | Admitting: Urology

## 2023-09-09 ENCOUNTER — Ambulatory Visit: Payer: Federal, State, Local not specified - PPO | Admitting: Urology

## 2023-09-17 ENCOUNTER — Encounter: Payer: Self-pay | Admitting: Urology

## 2023-09-17 ENCOUNTER — Ambulatory Visit (INDEPENDENT_AMBULATORY_CARE_PROVIDER_SITE_OTHER): Payer: Federal, State, Local not specified - PPO | Admitting: Urology

## 2023-09-17 VITALS — BP 115/73 | HR 88

## 2023-09-17 DIAGNOSIS — N401 Enlarged prostate with lower urinary tract symptoms: Secondary | ICD-10-CM

## 2023-09-17 DIAGNOSIS — N529 Male erectile dysfunction, unspecified: Secondary | ICD-10-CM

## 2023-09-17 DIAGNOSIS — R972 Elevated prostate specific antigen [PSA]: Secondary | ICD-10-CM

## 2023-09-17 MED ORDER — TADALAFIL 5 MG PO TABS: 5.0000 mg | ORAL_TABLET | Freq: Every day | ORAL | 3 refills | Status: AC

## 2023-09-17 MED ORDER — FINASTERIDE 5 MG PO TABS: 5.0000 mg | ORAL_TABLET | Freq: Every day | ORAL | 3 refills | Status: AC

## 2023-09-17 NOTE — Progress Notes (Signed)
   Assessment: 1. PSA elevation   2. Benign localized prostatic hyperplasia with lower urinary tract symptoms (LUTS)   3. Erectile dysfunction of organic origin     Plan: Continue Cialis and finasteride Follow-up 1 year or sooner problems arise Psa prior to visit  Chief Complaint: Prostate issues  HPI: Nathan Park is a 58 y.o. male who presents for continued evaluation of elevated PSA and BPH. Please see my note 12/11/2022 at the time of initial visit for detailed history and subsequent follow-up 01/28/2023.  Briefly the patient has had an elevated and fluctuating PSA for several years. Most recent PSA 11/2022 was 5.77 and ExoDx returned 16.6 above risk cut point. Patient underwent prostate MRI which showed no high-grade lesions. Transrectal ultrasound and prostate biopsy 02/2023 revealed benign pathology and 60 g gland. Following that the patient was started on combination medical therapy with daily Cialis 5 mg for lower urinary tract symptoms and ED with the addition of finasteride 5 mg daily for his BPH.  Patient reports doing extremely well.  He reports marked improvement in his lower urinary tract symptoms.  Cialis also works well for his ED.  Follow-up PSA data-- 08/2023  2.8  Portions of the above documentation were copied from a prior visit for review purposes only.  Allergies: Allergies  Allergen Reactions   Morphine And Codeine     "Does not work for me"   Oxycodone Hcl Itching and Nausea Only    PMH: Past Medical History:  Diagnosis Date   Barrett esophagus 01/31/10   at 32 cm   Complication of anesthesia    slow to wake up   Diabetes mellitus    Diverticulosis    GERD (gastroesophageal reflux disease)    Headache(784.0)    Hemorrhoids    Hiatal hernia    Hiatal hernia    Nasal congestion    Rectal pain    Refusal of blood transfusions as patient is Jehovah's Witness    Umbilical hernia     PSH: Past Surgical History:  Procedure Laterality Date    CARPAL TUNNEL RELEASE     LAPAROSCOPIC NISSEN FUNDOPLICATION  08/25/2012   Procedure: LAPAROSCOPIC NISSEN FUNDOPLICATION;  Surgeon: Valarie Merino, MD;  Location: [facility];  Service: General;  Laterality: N/A;   ORIF DISTAL RADIUS FRACTURE  2009   repair umblical hernia repair  04/01/10   sigmoid colectomy  04/01/10   take down of colovesical fistula  04/01/10   takedown of enterovesical fistula  04/01/10   WRIST SURGERY     left    SH: Social History   Tobacco Use   Smoking status: Never   Smokeless tobacco: Never  Substance Use Topics   Alcohol use: Yes    Alcohol/week: 5.0 standard drinks of alcohol    Types: 3 Glasses of wine, 2 Shots of liquor per week    Comment: 2 drinks a week    Drug use: No    ROS: Constitutional:  Negative for fever, chills, weight loss CV: Negative for chest pain, previous MI, hypertension Respiratory:  Negative for shortness of breath, wheezing, sleep apnea, frequent cough GI:  Negative for nausea, vomiting, bloody stool, GERD  PE: BP 115/73   Pulse 88  GENERAL APPEARANCE:  Well appearing, well developed, well nourished, NAD    Results: UA clear

## 2023-09-25 LAB — URINALYSIS, ROUTINE W REFLEX MICROSCOPIC
Bilirubin, UA: NEGATIVE
Glucose, UA: NEGATIVE
Ketones, UA: NEGATIVE
Leukocytes,UA: NEGATIVE
Nitrite, UA: NEGATIVE
Protein,UA: NEGATIVE
Specific Gravity, UA: >1.03 — ABNORMAL HIGH (ref 1.005–1.030)
Urobilinogen, Ur: 0.2 mg/dL (ref 0.2–1.0)
pH, UA: 6 (ref 5.0–7.5)

## 2023-09-25 LAB — MICROSCOPIC EXAMINATION

## 2023-10-28 LAB — HM DIABETES EYE EXAM

## 2023-11-01 ENCOUNTER — Other Ambulatory Visit: Payer: Self-pay | Admitting: Internal Medicine

## 2023-11-01 DIAGNOSIS — E118 Type 2 diabetes mellitus with unspecified complications: Secondary | ICD-10-CM

## 2023-11-24 DIAGNOSIS — M7542 Impingement syndrome of left shoulder: Secondary | ICD-10-CM | POA: Diagnosis not present

## 2023-12-01 ENCOUNTER — Ambulatory Visit: Admitting: Internal Medicine

## 2023-12-07 ENCOUNTER — Ambulatory Visit: Admitting: Internal Medicine

## 2023-12-08 DIAGNOSIS — M25512 Pain in left shoulder: Secondary | ICD-10-CM | POA: Insufficient documentation

## 2023-12-10 DIAGNOSIS — R0781 Pleurodynia: Secondary | ICD-10-CM | POA: Diagnosis not present

## 2023-12-23 ENCOUNTER — Ambulatory Visit: Admitting: Internal Medicine

## 2023-12-23 ENCOUNTER — Encounter: Payer: Self-pay | Admitting: Internal Medicine

## 2023-12-23 VITALS — BP 122/80 | HR 74 | Temp 98.0°F | Resp 16 | Ht 66.0 in | Wt 170.4 lb

## 2023-12-23 DIAGNOSIS — E118 Type 2 diabetes mellitus with unspecified complications: Secondary | ICD-10-CM

## 2023-12-23 DIAGNOSIS — E785 Hyperlipidemia, unspecified: Secondary | ICD-10-CM

## 2023-12-23 DIAGNOSIS — Z7985 Long-term (current) use of injectable non-insulin antidiabetic drugs: Secondary | ICD-10-CM | POA: Diagnosis not present

## 2023-12-23 LAB — HEMOGLOBIN A1C: Hgb A1c MFr Bld: 6.4 % (ref 4.6–6.5)

## 2023-12-23 LAB — CBC WITH DIFFERENTIAL/PLATELET
Basophils Absolute: 0 10*3/uL (ref 0.0–0.1)
Basophils Relative: 0.5 % (ref 0.0–3.0)
Eosinophils Absolute: 0.2 10*3/uL (ref 0.0–0.7)
Eosinophils Relative: 2.1 % (ref 0.0–5.0)
HCT: 42.7 % (ref 39.0–52.0)
Hemoglobin: 14.5 g/dL (ref 13.0–17.0)
Lymphocytes Relative: 20.4 % (ref 12.0–46.0)
Lymphs Abs: 1.6 10*3/uL (ref 0.7–4.0)
MCHC: 34 g/dL (ref 30.0–36.0)
MCV: 91.3 fl (ref 78.0–100.0)
Monocytes Absolute: 0.8 10*3/uL (ref 0.1–1.0)
Monocytes Relative: 10.3 % (ref 3.0–12.0)
Neutro Abs: 5.3 10*3/uL (ref 1.4–7.7)
Neutrophils Relative %: 66.7 % (ref 43.0–77.0)
Platelets: 227 10*3/uL (ref 150.0–400.0)
RBC: 4.68 Mil/uL (ref 4.22–5.81)
RDW: 13.5 % (ref 11.5–15.5)
WBC: 8 10*3/uL (ref 4.0–10.5)

## 2023-12-23 LAB — HEPATIC FUNCTION PANEL
ALT: 23 U/L (ref 0–53)
AST: 21 U/L (ref 0–37)
Albumin: 4.4 g/dL (ref 3.5–5.2)
Alkaline Phosphatase: 44 U/L (ref 39–117)
Bilirubin, Direct: 0.1 mg/dL (ref 0.0–0.3)
Total Bilirubin: 0.6 mg/dL (ref 0.2–1.2)
Total Protein: 6.6 g/dL (ref 6.0–8.3)

## 2023-12-23 LAB — URINALYSIS, ROUTINE W REFLEX MICROSCOPIC
Bilirubin Urine: NEGATIVE
Ketones, ur: NEGATIVE
Leukocytes,Ua: NEGATIVE
Nitrite: NEGATIVE
Specific Gravity, Urine: 1.025 (ref 1.000–1.030)
Total Protein, Urine: NEGATIVE
Urine Glucose: NEGATIVE
Urobilinogen, UA: 1 (ref 0.0–1.0)
pH: 6 (ref 5.0–8.0)

## 2023-12-23 LAB — BASIC METABOLIC PANEL WITH GFR
BUN: 22 mg/dL (ref 6–23)
CO2: 27 meq/L (ref 19–32)
Calcium: 8.7 mg/dL (ref 8.4–10.5)
Chloride: 104 meq/L (ref 96–112)
Creatinine, Ser: 0.92 mg/dL (ref 0.40–1.50)
GFR: 90.81 mL/min (ref >60.00)
Glucose, Bld: 106 mg/dL — ABNORMAL HIGH (ref 70–99)
Potassium: 3.9 meq/L (ref 3.5–5.1)
Sodium: 138 meq/L (ref 135–145)

## 2023-12-23 LAB — MICROALBUMIN / CREATININE URINE RATIO
Creatinine,U: 182.1 mg/dL
Microalb Creat Ratio: 5.5 mg/g (ref 0.0–30.0)
Microalb, Ur: 1 mg/dL (ref 0.0–1.9)

## 2023-12-23 NOTE — Patient Instructions (Signed)

## 2023-12-23 NOTE — Progress Notes (Unsigned)
 Subjective:  Patient ID: Nathan Park, male    DOB: 03/08/1966  Age: 58 y.o. MRN: [id number]  CC: Diabetes (F/u DM.   No concerns.  )   HPI Nathan Park presents for f/up ----   Discussed the use of AI scribe software for clinical note transcription with the patient, who gave verbal consent to proceed.  History of Present Illness   Nathan Park is a 58 year old male who presents for a routine follow-up visit.  He feels well overall and remains active, particularly enjoying mountain biking. He has experienced falls while biking, which he attributes to navigational errors rather than medical issues. No chest pain, shortness of breath, dizziness, or lightheadedness.  He has a history of left shoulder pain with decreased range of motion, initially suspected to be a torn rotator cuff. After evaluation with x-rays and a CT scan, it was diagnosed as a strain of the bicep muscle over the shoulder. A cortisone shot was administered approximately two weeks ago, resulting in significant improvement with no current pain or limitations in shoulder movement.  He has diabetes mellitus but denies symptoms of hyperglycemia such as excessive thirst or urination. His symptoms have been stable for years with no recent changes. A recent eye exam showed no signs of diabetic retinopathy.  No issues with urination and maintains good hydration throughout the day. PSA level is 2.8, within normal limits. He saw a urologist in January, who was satisfied with the results, and no follow-up is needed for at least a year.       Outpatient Medications Prior to Visit  Medication Sig Dispense Refill   finasteride  (PROSCAR ) 5 MG tablet Take 1 tablet (5 mg total) by mouth daily. 90 tablet 3   glucose blood (ONE TOUCH ULTRA TEST) test strip Use as instructed 100 each 12   omega-3 acid ethyl esters (LOVAZA ) 1 g capsule TAKE 2 CAPSULES (2 G TOTAL) BY MOUTH 2 (TWO) TIMES DAILY. 360 capsule 1   ONETOUCH DELICA LANCETS 33G MISC  Test up to BID. DX: E11.9 200 each 4   tadalafil  (CIALIS ) 5 MG tablet Take 1 tablet (5 mg total) by mouth daily. 90 tablet 3   Pitavastatin  Calcium  1 MG TABS Take 1 tablet (1 mg total) by mouth daily. 90 tablet 1   Semaglutide , 2 MG/DOSE, (OZEMPIC , 2 MG/DOSE,) 8 MG/3ML SOPN INJECT 2 MG AS DIRECTED ONCE A WEEK. 9 mL 0   No facility-administered medications prior to visit.    ROS Review of Systems  Constitutional:  Negative for appetite change, chills, diaphoresis and fatigue.  HENT: Negative.  Negative for trouble swallowing.   Eyes: Negative.   Respiratory: Negative.  Negative for cough, chest tightness, shortness of breath and wheezing.   Cardiovascular:  Negative for chest pain, palpitations and leg swelling.  Gastrointestinal: Negative.  Negative for abdominal pain, constipation, diarrhea, nausea and vomiting.  Endocrine: Negative.   Genitourinary: Negative.  Negative for difficulty urinating.  Musculoskeletal: Negative.  Negative for arthralgias and myalgias.  Skin: Negative.   Neurological: Negative.  Negative for dizziness and weakness.  Hematological:  Negative for adenopathy. Does not bruise/bleed easily.  Psychiatric/Behavioral: Negative.      Objective:  BP 122/80 (BP Location: Left Arm)   Pulse 74   Temp 98 F (36.7 C) (Oral)   Resp 16   Ht 5\' 6"  (1.676 m)   Wt 170 lb 6.4 oz (77.3 kg)   SpO2 96%   BMI 27.50 kg/m  BP Readings from Last 3 Encounters:  12/23/23 122/80  09/17/23 115/73  05/15/23 126/78    Wt Readings from Last 3 Encounters:  12/23/23 170 lb 6.4 oz (77.3 kg)  05/15/23 181 lb (82.1 kg)  01/28/23 175 lb (79.4 kg)    Physical Exam Vitals reviewed.  Constitutional:      Appearance: Normal appearance.  HENT:     Mouth/Throat:     Mouth: Mucous membranes are moist.  Eyes:     General: No scleral icterus.    Conjunctiva/sclera: Conjunctivae normal.  Cardiovascular:     Rate and Rhythm: Normal rate.     Heart sounds: No murmur  heard. Pulmonary:     Effort: Pulmonary effort is normal.     Breath sounds: No stridor. No wheezing, rhonchi or rales.  Abdominal:     General: Abdomen is flat.     Palpations: There is no mass.     Tenderness: There is no abdominal tenderness. There is no guarding.     Hernia: No hernia is present.  Musculoskeletal:        General: Normal range of motion.     Cervical back: Neck supple.     Right lower leg: No edema.     Left lower leg: No edema.  Lymphadenopathy:     Cervical: No cervical adenopathy.  Skin:    General: Skin is warm and dry.  Neurological:     General: No focal deficit present.     Mental Status: He is alert.  Psychiatric:        Mood and Affect: Mood normal.        Behavior: Behavior normal.     Lab Results  Component Value Date   WBC 8.0 12/23/2023   HGB 14.5 12/23/2023   HCT 42.7 12/23/2023   PLT 227.0 12/23/2023   GLUCOSE 106 (H) 12/23/2023   CHOL 128 05/15/2023   TRIG 116.0 05/15/2023   HDL 47.80 05/15/2023   LDLDIRECT 54.0 03/10/2019   LDLCALC 57 05/15/2023   ALT 23 12/23/2023   AST 21 12/23/2023   NA 138 12/23/2023   K 3.9 12/23/2023   CL 104 12/23/2023   CREATININE 0.92 12/23/2023   BUN 22 12/23/2023   CO2 27 12/23/2023   TSH 0.60 05/15/2023   PSA 2.8 09/02/2023   INR 1.08 03/25/2010   HGBA1C 6.4 12/23/2023   MICROALBUR 1.0 12/23/2023    CT CARDIAC SCORING ([facility] LOCATIONS ONLY) Result Date: 07/01/2023 CLINICAL DATA:  58 year old Caucasian male under screening evaluation for coronary artery disease. * Tracking Code: FCC * EXAM: CT CARDIAC CORONARY ARTERY CALCIUM  SCORE TECHNIQUE: Non-contrast imaging through the heart was performed using prospective ECG gating. Image post processing was performed on an independent workstation, allowing for quantitative analysis of the heart and coronary arteries. Note that this exam targets the heart and the chest was not imaged in its entirety. COMPARISON:  No priors. FINDINGS: CORONARY CALCIUM  SCORES:  Left Main: 0 LAD: 0 LCx: 0 RCA/PDA: 0 Total Agatston Score: 0 MESA database percentile: N/A AORTA MEASUREMENTS: Ascending Aorta: 3.8 cm Descending Aorta:2.7 cm OTHER FINDINGS: Mild linear scarring in the right lower lobe. Within the visualized portions of the thorax there are no suspicious appearing pulmonary nodules or masses, there is no acute consolidative airspace disease, no pleural effusions, no pneumothorax and no lymphadenopathy. Visualized portions of the upper abdomen are unremarkable. There are no aggressive appearing lytic or blastic lesions noted in the visualized portions of the skeleton. IMPRESSION: 1. Patient's total coronary  artery calcium  score is 0 which indicates a very low (but non-zero) risk of significant coronary artery atherosclerosis. 2. No significant incidental noncardiac findings are noted. Electronically Signed   By: Alexandria Angel M.D.   On: 07/01/2023 10:40    Assessment & Plan:   Type 2 diabetes mellitus with complication, without long-term current use of insulin  (HCC)- Blood sugar is well controlled- -     Basic metabolic panel with GFR; Future -     CBC with Differential/Platelet; Future -     Hemoglobin A1c; Future -     Hepatic function panel; Future -     Urinalysis, Routine w reflex microscopic; Future -     Microalbumin / creatinine urine ratio; Future -     Ozempic  (2 MG/DOSE); Inject 2 mg into the skin once a week.  Dispense: 9 mL; Refill: 0  Hyperlipidemia LDL goal <70- LDL goal achieved. Doing well on the statin  -     Hepatic function panel; Future -     Pitavastatin  Calcium ; Take 1 tablet (1 mg total) by mouth daily.  Dispense: 90 tablet; Refill: 1     Follow-up: Return in about 6 months (around 06/24/2024).  Sandra Crouch, MD

## 2023-12-24 ENCOUNTER — Encounter: Payer: Self-pay | Admitting: Internal Medicine

## 2023-12-24 MED ORDER — INSULIN PEN NEEDLE 32G X 6 MM MISC
1.0000 | 0 refills | Status: AC
Start: 2023-12-24 — End: ?

## 2023-12-24 MED ORDER — PITAVASTATIN CALCIUM 1 MG PO TABS: 1.0000 mg | ORAL_TABLET | Freq: Every day | ORAL | 1 refills | Status: DC

## 2023-12-24 MED ORDER — OZEMPIC (2 MG/DOSE) 8 MG/3ML ~~LOC~~ SOPN: 2.0000 mg | PEN_INJECTOR | SUBCUTANEOUS | 0 refills | Status: DC

## 2024-04-27 ENCOUNTER — Other Ambulatory Visit: Payer: Self-pay | Admitting: Internal Medicine

## 2024-04-27 DIAGNOSIS — E118 Type 2 diabetes mellitus with unspecified complications: Secondary | ICD-10-CM

## 2024-06-14 ENCOUNTER — Other Ambulatory Visit (HOSPITAL_COMMUNITY): Payer: Self-pay

## 2024-06-15 ENCOUNTER — Other Ambulatory Visit (HOSPITAL_COMMUNITY): Payer: Self-pay

## 2024-06-15 ENCOUNTER — Other Ambulatory Visit: Payer: Self-pay | Admitting: Internal Medicine

## 2024-06-15 DIAGNOSIS — E118 Type 2 diabetes mellitus with unspecified complications: Secondary | ICD-10-CM

## 2024-06-20 ENCOUNTER — Other Ambulatory Visit (HOSPITAL_COMMUNITY): Payer: Self-pay

## 2024-06-20 ENCOUNTER — Ambulatory Visit: Admitting: Internal Medicine

## 2024-06-20 ENCOUNTER — Ambulatory Visit: Payer: Self-pay | Admitting: Internal Medicine

## 2024-06-20 ENCOUNTER — Encounter: Payer: Self-pay | Admitting: Internal Medicine

## 2024-06-20 VITALS — BP 114/76 | HR 75 | Temp 97.9°F | Ht 66.0 in | Wt 176.8 lb

## 2024-06-20 DIAGNOSIS — E785 Hyperlipidemia, unspecified: Secondary | ICD-10-CM | POA: Diagnosis not present

## 2024-06-20 DIAGNOSIS — N138 Other obstructive and reflux uropathy: Secondary | ICD-10-CM

## 2024-06-20 DIAGNOSIS — Z7985 Long-term (current) use of injectable non-insulin antidiabetic drugs: Secondary | ICD-10-CM

## 2024-06-20 DIAGNOSIS — E118 Type 2 diabetes mellitus with unspecified complications: Secondary | ICD-10-CM

## 2024-06-20 DIAGNOSIS — E781 Pure hyperglyceridemia: Secondary | ICD-10-CM | POA: Diagnosis not present

## 2024-06-20 DIAGNOSIS — N401 Enlarged prostate with lower urinary tract symptoms: Secondary | ICD-10-CM | POA: Diagnosis not present

## 2024-06-20 DIAGNOSIS — I959 Hypotension, unspecified: Secondary | ICD-10-CM | POA: Diagnosis not present

## 2024-06-20 LAB — PSA: PSA: 2.44 ng/mL (ref 0.10–4.00)

## 2024-06-20 LAB — BASIC METABOLIC PANEL WITH GFR
BUN: 27 mg/dL — ABNORMAL HIGH (ref 6–23)
CO2: 29 meq/L (ref 19–32)
Calcium: 9.1 mg/dL (ref 8.4–10.5)
Chloride: 102 meq/L (ref 96–112)
Creatinine, Ser: 0.94 mg/dL (ref 0.40–1.50)
GFR: 88.19 mL/min (ref >60.00)
Glucose, Bld: 89 mg/dL (ref 70–99)
Potassium: 4.2 meq/L (ref 3.5–5.1)
Sodium: 140 meq/L (ref 135–145)

## 2024-06-20 LAB — TSH: TSH: 0.49 u[IU]/mL (ref 0.35–5.50)

## 2024-06-20 LAB — HEMOGLOBIN A1C: Hgb A1c MFr Bld: 6.3 % (ref 4.6–6.5)

## 2024-06-20 MED ORDER — COVID-19 MRNA VAC-TRIS(PFIZER) 30 MCG/0.3ML IM SUSY: 0.3000 mL | PREFILLED_SYRINGE | Freq: Once | INTRAMUSCULAR | 0 refills | Status: AC

## 2024-06-20 MED ORDER — OMEGA-3-ACID ETHYL ESTERS 1 G PO CAPS
2.0000 g | ORAL_CAPSULE | Freq: Two times a day (BID) | ORAL | 1 refills | Status: AC
Start: 2024-06-20 — End: ?

## 2024-06-20 NOTE — Patient Instructions (Signed)

## 2024-06-20 NOTE — Progress Notes (Signed)
 "  Subjective:  Patient ID: Nathan Park, male    DOB: [birth year]  Age: 58 y.o. MRN: [id number]  CC: Diabetes   HPI Nathan Park presents for f/up ----  Discussed the use of AI scribe software for clinical note transcription with the patient, who gave verbal consent to proceed.  History of Present Illness Nathan Park is a 58 year old male who presents for a routine follow-up visit.  He underwent a prostate biopsy last year, which was negative for prostate cancer. He experiences frequent urination without difficulty, and there is no associated abdominal pain or cramping.  He experiences constipation, which he attributes to his use of Ozempic . He does not treat the constipation, and it does not lead to abdominal pain or cramping.  His hands and feet get noticeably cold, but they do not change color, and he does not experience numbness, weakness, or tingling. He sometimes puts his hands in his pockets to warm them but does not feel the need to wear gloves or socks at night.  He remains active, working in facility maintenance for the [employer], and rides a mountain bike. No chest pain or shortness of breath during activity.  He received a flu shot recently at work and had COVID vaccines a couple of years ago. He mentions that obtaining COVID vaccines now requires a prescription from a doctor.  No dizziness or lightheadedness reported.   Outpatient Medications Prior to Visit  Medication Sig Dispense Refill   finasteride  (PROSCAR ) 5 MG tablet Take 1 tablet (5 mg total) by mouth daily. 90 tablet 3   glucose blood (ONE TOUCH ULTRA TEST) test strip Use as instructed 100 each 12   Insulin  Pen Needle 32G X 6 MM MISC 1 Act by Does not apply route once a week. 100 each 0   ONETOUCH DELICA LANCETS 33G MISC Test up to BID. DX: E11.9 200 each 4   Semaglutide , 2 MG/DOSE, (OZEMPIC , 2 MG/DOSE,) 8 MG/3ML SOPN INJECT 2 MG INTO THE SKIN ONCE A WEEK. 3 mL 1   tadalafil  (CIALIS ) 5 MG tablet Take 1  tablet (5 mg total) by mouth daily. 90 tablet 3   omega-3 acid ethyl esters (LOVAZA ) 1 g capsule TAKE 2 CAPSULES (2 G TOTAL) BY MOUTH 2 (TWO) TIMES DAILY. 360 capsule 1   Pitavastatin  Calcium  1 MG TABS Take 1 tablet (1 mg total) by mouth daily. 90 tablet 1   No facility-administered medications prior to visit.    ROS Review of Systems  Constitutional: Negative.  Negative for appetite change, chills, diaphoresis, fatigue and fever.  HENT: Negative.    Eyes: Negative.   Respiratory: Negative.  Negative for cough, chest tightness, shortness of breath and wheezing.   Cardiovascular:  Negative for chest pain, palpitations and leg swelling.  Gastrointestinal:  Positive for constipation. Negative for abdominal pain, diarrhea, nausea and vomiting.  Endocrine: Negative.   Genitourinary: Negative.  Negative for difficulty urinating.  Musculoskeletal: Negative.  Negative for arthralgias and myalgias.  Skin: Negative.   Neurological: Negative.  Negative for dizziness and light-headedness.  Hematological:  Negative for adenopathy. Does not bruise/bleed easily.  Psychiatric/Behavioral: Negative.      Objective:  BP 114/76 (BP Location: Left Arm, Patient Position: Sitting, Cuff Size: Normal)   Pulse 75   Temp 97.9 F (36.6 C) (Oral)   Ht 5' 6 (1.676 m)   Wt 176 lb 12.8 oz (80.2 kg)   SpO2 97%   BMI 28.54 kg/m   BP Readings  from Last 3 Encounters:  06/20/24 114/76  12/23/23 122/80  09/17/23 115/73    Wt Readings from Last 3 Encounters:  06/20/24 176 lb 12.8 oz (80.2 kg)  12/23/23 170 lb 6.4 oz (77.3 kg)  05/15/23 181 lb (82.1 kg)    Physical Exam Vitals reviewed.  Constitutional:      Appearance: Normal appearance.  HENT:     Nose: Nose normal.     Mouth/Throat:     Mouth: Mucous membranes are moist.  Eyes:     General: No scleral icterus.    Conjunctiva/sclera: Conjunctivae normal.  Cardiovascular:     Rate and Rhythm: Normal rate and regular rhythm.     Heart sounds: No  murmur heard.    No friction rub. No gallop.     Comments: EKG--- NSR, 74 bpm LAD No LVH or Q waves Unchanged  Pulmonary:     Effort: Pulmonary effort is normal.     Breath sounds: No stridor. No wheezing, rhonchi or rales.  Abdominal:     General: Abdomen is flat. Bowel sounds are normal. There is no distension.     Palpations: Abdomen is soft. There is no hepatomegaly, splenomegaly or mass.     Tenderness: There is no abdominal tenderness.  Musculoskeletal:     Cervical back: Neck supple.     Right lower leg: No edema.     Left lower leg: No edema.  Lymphadenopathy:     Cervical: No cervical adenopathy.  Skin:    General: Skin is warm and dry.  Neurological:     General: No focal deficit present.     Mental Status: He is alert.  Psychiatric:        Mood and Affect: Mood normal.        Behavior: Behavior normal.     Lab Results  Component Value Date   WBC 8.0 12/23/2023   HGB 14.5 12/23/2023   HCT 42.7 12/23/2023   PLT 227.0 12/23/2023   GLUCOSE 89 06/20/2024   CHOL 128 05/15/2023   TRIG 116.0 05/15/2023   HDL 47.80 05/15/2023   LDLDIRECT 54.0 03/10/2019   LDLCALC 57 05/15/2023   ALT 23 12/23/2023   AST 21 12/23/2023   NA 140 06/20/2024   K 4.2 06/20/2024   CL 102 06/20/2024   CREATININE 0.94 06/20/2024   BUN 27 (H) 06/20/2024   CO2 29 06/20/2024   TSH 0.49 06/20/2024   PSA 2.44 06/20/2024   INR 1.08 03/25/2010   HGBA1C 6.3 06/20/2024   MICROALBUR 1.0 12/23/2023    CT CARDIAC SCORING ([facility] LOCATIONS ONLY) Result Date: 07/01/2023 CLINICAL DATA:  58 year old Caucasian male under screening evaluation for coronary artery disease. * Tracking Code: FCC * EXAM: CT CARDIAC CORONARY ARTERY CALCIUM  SCORE TECHNIQUE: Non-contrast imaging through the heart was performed using prospective ECG gating. Image post processing was performed on an independent workstation, allowing for quantitative analysis of the heart and coronary arteries. Note that this exam targets the  heart and the chest was not imaged in its entirety. COMPARISON:  No priors. FINDINGS: CORONARY CALCIUM  SCORES: Left Main: 0 LAD: 0 LCx: 0 RCA/PDA: 0 Total Agatston Score: 0 MESA database percentile: N/A AORTA MEASUREMENTS: Ascending Aorta: 3.8 cm Descending Aorta:2.7 cm OTHER FINDINGS: Mild linear scarring in the right lower lobe. Within the visualized portions of the thorax there are no suspicious appearing pulmonary nodules or masses, there is no acute consolidative airspace disease, no pleural effusions, no pneumothorax and no lymphadenopathy. Visualized portions of the upper abdomen  are unremarkable. There are no aggressive appearing lytic or blastic lesions noted in the visualized portions of the skeleton. IMPRESSION: 1. Patient's total coronary artery calcium  score is 0 which indicates a very low (but non-zero) risk of significant coronary artery atherosclerosis. 2. No significant incidental noncardiac findings are noted. Electronically Signed   By: Toribio Aye M.D.   On: 07/01/2023 10:40    Assessment & Plan:  Type 2 diabetes mellitus with complication, without long-term current use of insulin  (HCC)- Blood sugar is well controlled. -     Basic metabolic panel with GFR; Future -     Hemoglobin A1c; Future -     HM Diabetes Foot Exam -     COVID-19 mRNA Vac-TriS(Pfizer); Inject 0.3 mLs into the muscle once for 1 dose.  Dispense: 0.3 mL; Refill: 0  Hyperlipidemia LDL goal <70- LDL goal achieved. Doing well on the statin  -     TSH; Future -     Lipid panel; Future -     Pitavastatin  Calcium ; Take 1 tablet (1 mg total) by mouth daily.  Dispense: 90 tablet; Refill: 1  Pure hypertriglyceridemia -     Omega-3-acid  Ethyl Esters; Take 2 capsules (2 g total) by mouth 2 (two) times daily.  Dispense: 360 capsule; Refill: 1  Hypotension, unspecified hypotension type- EKG is reassuring. -     EKG 12-Lead  BPH with obstruction/lower urinary tract symptoms -     PSA; Future     Follow-up:  Return in about 6 months (around 12/18/2024).  Debby Molt, MD "

## 2024-06-21 MED ORDER — PITAVASTATIN CALCIUM 1 MG PO TABS: 1.0000 mg | ORAL_TABLET | Freq: Every day | ORAL | 1 refills | Status: AC

## 2024-06-23 ENCOUNTER — Ambulatory Visit (INDEPENDENT_AMBULATORY_CARE_PROVIDER_SITE_OTHER)

## 2024-06-23 DIAGNOSIS — E785 Hyperlipidemia, unspecified: Secondary | ICD-10-CM

## 2024-06-23 LAB — LIPID PANEL
Cholesterol: 147 mg/dL (ref 0–200)
HDL: 47.8 mg/dL (ref >39.00)
LDL Cholesterol: 78 mg/dL (ref 0–99)
NonHDL: 98.93
Total CHOL/HDL Ratio: 3
Triglycerides: 104 mg/dL (ref 0.0–149.0)
VLDL: 20.8 mg/dL (ref 0.0–40.0)

## 2024-07-12 ENCOUNTER — Other Ambulatory Visit: Payer: Self-pay | Admitting: Internal Medicine

## 2024-07-12 DIAGNOSIS — E118 Type 2 diabetes mellitus with unspecified complications: Secondary | ICD-10-CM

## 2024-07-20 ENCOUNTER — Other Ambulatory Visit: Payer: Self-pay

## 2024-07-20 ENCOUNTER — Telehealth: Payer: Self-pay

## 2024-07-20 ENCOUNTER — Other Ambulatory Visit (HOSPITAL_COMMUNITY): Payer: Self-pay

## 2024-07-20 DIAGNOSIS — E118 Type 2 diabetes mellitus with unspecified complications: Secondary | ICD-10-CM

## 2024-07-20 MED ORDER — OZEMPIC (2 MG/DOSE) 8 MG/3ML ~~LOC~~ SOPN: 2.0000 mg | PEN_INJECTOR | SUBCUTANEOUS | 1 refills | Status: AC

## 2024-07-20 NOTE — Telephone Encounter (Signed)
 Pharmacy Patient Advocate Encounter   Received notification from RX Request Messages that prior authorization for Ozempic  8mg /48ml is required/requested.   Insurance verification completed.   The patient is insured through [facility].   Per test claim: PA required and submitted KEY/EOC/Request #: [id number] from 07/20/24 to 07/20/25   PA# [id number]

## 2024-09-12 ENCOUNTER — Ambulatory Visit: Payer: Self-pay | Admitting: Urology

## 2024-10-19 ENCOUNTER — Ambulatory Visit: Payer: Self-pay | Admitting: Urology
